# Patient Record
Sex: Female | Born: 1935
Health system: Southern US, Community
[De-identification: ages and names within clinical notes are randomized; demographics above are authoritative.]

## PROBLEM LIST (undated history)

## (undated) DIAGNOSIS — IMO0001 Reserved for inherently not codable concepts without codable children: Secondary | ICD-10-CM

## (undated) DIAGNOSIS — H269 Unspecified cataract: Secondary | ICD-10-CM

## (undated) DIAGNOSIS — E039 Hypothyroidism, unspecified: Secondary | ICD-10-CM

## (undated) DIAGNOSIS — C7A8 Other malignant neuroendocrine tumors: Secondary | ICD-10-CM

## (undated) DIAGNOSIS — R911 Solitary pulmonary nodule: Secondary | ICD-10-CM

## (undated) DIAGNOSIS — C4490 Unspecified malignant neoplasm of skin, unspecified: Secondary | ICD-10-CM

## (undated) DIAGNOSIS — C50919 Malignant neoplasm of unspecified site of unspecified female breast: Secondary | ICD-10-CM

## (undated) DIAGNOSIS — J189 Pneumonia, unspecified organism: Secondary | ICD-10-CM

## (undated) DIAGNOSIS — Z8489 Family history of other specified conditions: Secondary | ICD-10-CM

## (undated) DIAGNOSIS — N907 Vulvar cyst: Secondary | ICD-10-CM

## (undated) DIAGNOSIS — E041 Nontoxic single thyroid nodule: Secondary | ICD-10-CM

## (undated) HISTORY — DX: Nontoxic single thyroid nodule: E04.1

## (undated) HISTORY — PX: VARICOSE VEIN SURGERY: SHX832

## (undated) HISTORY — PX: EYE SURGERY: SHX253

## (undated) HISTORY — PX: CATARACT EXTRACTION W/ INTRAOCULAR LENS IMPLANT: SHX1309

---
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
## 2002-02-03 ENCOUNTER — Other Ambulatory Visit: Admission: RE | Admit: 2002-02-03 | Discharge: 2002-02-03 | Payer: Self-pay

## 2010-07-21 ENCOUNTER — Emergency Department (HOSPITAL_COMMUNITY): Admission: EM | Admit: 2010-07-21 | Discharge: 2010-07-21 | Payer: Self-pay | Admitting: Family Medicine

## 2014-08-23 ENCOUNTER — Other Ambulatory Visit: Payer: Self-pay | Admitting: Family Medicine

## 2014-08-23 DIAGNOSIS — E039 Hypothyroidism, unspecified: Secondary | ICD-10-CM

## 2014-08-28 ENCOUNTER — Other Ambulatory Visit: Payer: Self-pay

## 2016-05-09 ENCOUNTER — Other Ambulatory Visit: Payer: Self-pay | Admitting: Family Medicine

## 2016-05-09 DIAGNOSIS — N6320 Unspecified lump in the left breast, unspecified quadrant: Principal | ICD-10-CM

## 2016-05-09 DIAGNOSIS — N6325 Unspecified lump in the left breast, overlapping quadrants: Secondary | ICD-10-CM

## 2016-05-16 ENCOUNTER — Ambulatory Visit
Admission: RE | Admit: 2016-05-16 | Discharge: 2016-05-16 | Disposition: A | Discharge status: Home / Self Care | Payer: Medicare Other | Source: Ambulatory Visit | Attending: Family Medicine | Admitting: Family Medicine

## 2016-05-16 ENCOUNTER — Other Ambulatory Visit: Payer: Self-pay | Admitting: Family Medicine

## 2016-05-16 DIAGNOSIS — N6325 Unspecified lump in the left breast, overlapping quadrants: Secondary | ICD-10-CM

## 2016-05-16 DIAGNOSIS — N6320 Unspecified lump in the left breast, unspecified quadrant: Secondary | ICD-10-CM

## 2016-05-20 ENCOUNTER — Ambulatory Visit
Admission: RE | Admit: 2016-05-20 | Discharge: 2016-05-20 | Disposition: A | Discharge status: Home / Self Care | Payer: Medicare Other | Source: Ambulatory Visit | Attending: Family Medicine | Admitting: Family Medicine

## 2016-05-20 ENCOUNTER — Other Ambulatory Visit: Payer: Self-pay | Admitting: Family Medicine

## 2016-05-20 DIAGNOSIS — N6320 Unspecified lump in the left breast, unspecified quadrant: Secondary | ICD-10-CM

## 2016-05-22 ENCOUNTER — Telehealth: Payer: Self-pay | Admitting: *Deleted

## 2016-05-22 DIAGNOSIS — C50412 Malignant neoplasm of upper-outer quadrant of left female breast: Secondary | ICD-10-CM | POA: Insufficient documentation

## 2016-05-22 NOTE — Telephone Encounter (Signed)
Confirmed [facility] for 05/28/16 at 1215pm .  Instructions and contact information given.

## 2016-05-23 ENCOUNTER — Telehealth: Payer: Self-pay | Admitting: *Deleted

## 2016-05-23 NOTE — Telephone Encounter (Signed)
Mailed clinic packet to pt.

## 2016-05-28 ENCOUNTER — Other Ambulatory Visit: Payer: Self-pay | Admitting: General Surgery

## 2016-05-28 ENCOUNTER — Encounter: Payer: Self-pay | Admitting: Radiation Oncology

## 2016-05-28 ENCOUNTER — Ambulatory Visit (HOSPITAL_BASED_OUTPATIENT_CLINIC_OR_DEPARTMENT_OTHER): Payer: Medicare Other | Admitting: Hematology and Oncology

## 2016-05-28 ENCOUNTER — Ambulatory Visit
Admission: RE | Admit: 2016-05-28 | Discharge: 2016-05-28 | Disposition: A | Discharge status: Home / Self Care | Payer: Medicare Other | Source: Ambulatory Visit | Attending: Radiation Oncology | Admitting: Radiation Oncology

## 2016-05-28 ENCOUNTER — Ambulatory Visit: Payer: Medicare Other | Admitting: Physical Therapy

## 2016-05-28 ENCOUNTER — Other Ambulatory Visit (HOSPITAL_BASED_OUTPATIENT_CLINIC_OR_DEPARTMENT_OTHER): Payer: Medicare Other

## 2016-05-28 ENCOUNTER — Encounter: Payer: Self-pay | Admitting: Hematology and Oncology

## 2016-05-28 ENCOUNTER — Encounter: Payer: Self-pay | Admitting: General Practice

## 2016-05-28 VITALS — BP 137/66 | HR 80 | Temp 98.0°F | Resp 17 | Ht 60.0 in | Wt 111.0 lb

## 2016-05-28 DIAGNOSIS — C50412 Malignant neoplasm of upper-outer quadrant of left female breast: Secondary | ICD-10-CM

## 2016-05-28 DIAGNOSIS — Z8042 Family history of malignant neoplasm of prostate: Secondary | ICD-10-CM | POA: Diagnosis not present

## 2016-05-28 DIAGNOSIS — Z87891 Personal history of nicotine dependence: Secondary | ICD-10-CM

## 2016-05-28 DIAGNOSIS — Z801 Family history of malignant neoplasm of trachea, bronchus and lung: Secondary | ICD-10-CM

## 2016-05-28 HISTORY — DX: Unspecified cataract: H26.9

## 2016-05-28 HISTORY — DX: Unspecified malignant neoplasm of skin, unspecified: C44.90

## 2016-05-28 LAB — CBC WITH DIFFERENTIAL/PLATELET
BASO%: 0.6 % (ref 0.0–2.0)
BASOS ABS: 0 10*3/uL (ref 0.0–0.1)
EOS ABS: 0.1 10*3/uL (ref 0.0–0.5)
EOS%: 1.9 % (ref 0.0–7.0)
HCT: 45.8 % (ref 34.8–46.6)
HGB: 14.7 g/dL (ref 11.6–15.9)
LYMPH%: 24.2 % (ref 14.0–49.7)
MCH: 30.6 pg (ref 25.1–34.0)
MCHC: 32.1 g/dL (ref 31.5–36.0)
MCV: 95.2 fL (ref 79.5–101.0)
MONO#: 0.5 10*3/uL (ref 0.1–0.9)
MONO%: 7.2 % (ref 0.0–14.0)
NEUT#: 4.1 10*3/uL (ref 1.5–6.5)
NEUT%: 66.1 % (ref 38.4–76.8)
PLATELETS: 261 10*3/uL (ref 145–400)
RBC: 4.81 10*6/uL (ref 3.70–5.45)
RDW: 13 % (ref 11.2–14.5)
WBC: 6.2 10*3/uL (ref 3.9–10.3)
lymph#: 1.5 10*3/uL (ref 0.9–3.3)

## 2016-05-28 LAB — COMPREHENSIVE METABOLIC PANEL
ALT: 15 U/L (ref 0–55)
ANION GAP: 7 meq/L (ref 3–11)
AST: 20 U/L (ref 5–34)
Albumin: 3.9 g/dL (ref 3.5–5.0)
Alkaline Phosphatase: 61 U/L (ref 40–150)
BILIRUBIN TOTAL: 0.55 mg/dL (ref 0.20–1.20)
BUN: 11.3 mg/dL (ref 7.0–26.0)
CO2: 32 meq/L — AB (ref 22–29)
Calcium: 9.6 mg/dL (ref 8.4–10.4)
Chloride: 100 mEq/L (ref 98–109)
Creatinine: 1 mg/dL (ref 0.6–1.1)
EGFR: 53 mL/min/{1.73_m2} — AB (ref >90)
GLUCOSE: 135 mg/dL (ref 70–140)
POTASSIUM: 4.6 meq/L (ref 3.5–5.1)
SODIUM: 139 meq/L (ref 136–145)
Total Protein: 7.1 g/dL (ref 6.4–8.3)

## 2016-05-28 NOTE — Assessment & Plan Note (Signed)
05/20/2016: Left breast biopsy 2:30 position: IDC with DCIS, grade 1, ER 100%, PR 90%, Ki-67 5%, HER-2 negative ratio 1.33; is okay  1:30 position: 0.7 cm invasive lobular cancer, grade 1, ER/PR positive HER-2 negative Ki-67 10%, T1 N0 stage IA   Pathology and radiology counseling:Discussed with the patient, the details of pathology including the type of breast cancer,the clinical staging, the significance of ER, PR and HER-2/neu receptors and the implications for treatment. After reviewing the pathology in detail, we proceeded to discuss the different treatment options between surgery, radiation, and antiestrogen therapies.  Recommendations: Breast MRI will be done based upon the lobular histology 1. Breast conserving surgery followed by 2. Adjuvant radiation therapy (to be finalized based upon final pathology report) 3. Adjuvant antiestrogen therapy with anastrozole 5-10 years  Return to clinic after surgery to discuss final pathology report.

## 2016-05-28 NOTE — Progress Notes (Signed)
Met with patient and daughter at [facility] to introduce [place] team/resources, reviewing distress screen per protocol.  The patient scored a 2 on the Psychosocial Distress Thermometer which indicates mild distress.  Also assessed for distress and other psychosocial needs.  ONCBCN DISTRESS SCREENING 05/28/2016  Screening Type Initial Screening  Distress experienced in past week (1-10) 2  Emotional problem type Nervousness/Anxiety;Adjusting to illness  Referral to support programs Yes   Counselor used open question to assess how client is processing the new diagnosis. Client reported having more stress initially and anxiety around her diagnosis, but feeling more at ease after talking with multiple professionals. Counselor used reflection of feeling and normalization regarding client's stress, and explored coping mechanisms the client uses. Client reported family and her faith being the greatest support and coping mechanism. Counselor and client discussed other support networks she has through church, high school friends, and neighbors around her.   Follow up needed:  [N]  Pt is aware of ongoing Support Team availability and contact information. Please also page as needs arise/circumstances change.  Thank you.  Wendall Papa, MS, [place], [facility] Counseling Intern-Department for Spiritual Care and [place], [place], [place]

## 2016-05-28 NOTE — Progress Notes (Signed)
Radiation Oncology         [phone] ________________________________  Initial Outpatient Consultation  Name: Victoria Rice MRN: [id number]  Date: 05/28/2016  DOB: [birth year]  TD:VVOHYW,VPXTGGYIR STEWART, MD  Leighton Ruff, MD   REFERRING PHYSICIAN: Leighton Ruff, MD  DIAGNOSIS: The encounter diagnosis was Breast cancer of upper-outer quadrant of left female breast ([place]).  Multifocal clinical stage I left breast cancer  HISTORY OF PRESENT ILLNESS::Victoria Rice is a 82 y.o. female who presented to her PCP, Dr. Leighton Ruff, for a palpable abnormality in the left breast. Mammogram and ultrasound on 05/16/16 noted two masses in the upper-outer left breast, measuring 0.7 x 0.3 x 0.4 cm and 0.4 x 0.4 x 0.3 cm. They are approximately 2.8 cm from each other and no lymphadenopathy was seen in the left axilla.  Biopsies were conducted on 05/20/16. Biopsy of the left breast in the 1 o'clock position was positive for grade 1 invasive and in situ mammary carcinoma (lobular phenotype) (ER 95% positive, PR 70% positive, HER2 negative, Ki67 10%). Biopsy of the left breast at the 2:30 o'clock position revealed grade 1 invasive and in situ mammary carcinoma (ER 100% positive, PR 90% positive, Ki67 5%).  The patient presents today in breast multidisciplinary clinic to discuss the role of radiation in the management of her disease.  PREVIOUS RADIATION THERAPY: No  PAST MEDICAL HISTORY:  has a past medical history of Skin cancer; Cataract; and Thyroid nodule.    PAST SURGICAL HISTORY: Past Surgical History  Procedure Laterality Date  . Breast lumpectomy      FAMILY HISTORY: family history includes Lung cancer in her brother; Prostate cancer in her brother.  SOCIAL HISTORY:  reports that she quit smoking about 42 years ago. Her smoking use included Cigarettes. She smoked 0.75 packs per day. She does not have any smokeless tobacco history on file. She reports that she drinks about 3.6  oz of alcohol per week.  ALLERGIES: Review of patient's allergies indicates no known allergies.  MEDICATIONS:  Current Outpatient Prescriptions  Medication Sig Dispense Refill  . cholecalciferol (VITAMIN D) 1000 units tablet Take 1,000 Units by mouth daily.    [name] SYNTHROID 75 MCG tablet      No current facility-administered medications for this encounter.    REVIEW OF SYSTEMS:  A 15 point review of systems is documented in the electronic medical record. This was obtained by the nursing staff. However, I reviewed this with the patient to discuss relevant findings and make appropriate changes.  Pertinent items noted in HPI and remainder of comprehensive ROS otherwise negative.  No pain within the breast nipple discharge or bleeding prior to diagnosis  The patient reports sinus problems, shortness of breath, and thyroid problems.  Gynecologic History  Age at first menstrual period? 15  Are you still having periods? No Approximate date of last period? 1985  If you no longer have periods: Have you used hormone replacement? No Obstetric History:  How many children have you carried to term? 2 Your age at first live birth? 31  Pregnant now or trying to get pregnant? No  Have you used birth control pills or hormone shots for contraception? No Health Maintenance:  Have you ever had a colonoscopy? No  Have you ever had a bone density? Yes If yes, date? ~1999  Date of your last PAP smear? ? Date of your FIRST mammogram? ?   PHYSICAL EXAM:  vitals were not taken for this visit.  Vitals with BMI 05/28/2016  Height  _0   Weight 111 lbs  BMI 67.1  Systolic 245  Diastolic 66  Pulse 80  Respirations 17  Lungs are clear to auscultation bilaterally. Heart has regular rate and rhythm. No palpable cervical, supraclavicular, or axillary adenopathy. Right lumpectomy scar from a prior benign surgery. Left breast shows extensive bruising in the upper outer quadrant. No dominant mass within the breast  nipple discharge or bleeding  ECOG = 1   LABORATORY DATA:  Lab Results  Component Value Date   WBC 6.2 05/28/2016   HGB 14.7 05/28/2016   HCT 45.8 05/28/2016   MCV 95.2 05/28/2016   PLT 261 05/28/2016   NEUTROABS 4.1 05/28/2016   Lab Results  Component Value Date   NA 139 05/28/2016   K 4.6 05/28/2016   CO2 32* 05/28/2016   GLUCOSE 135 05/28/2016   CREATININE 1.0 05/28/2016   CALCIUM 9.6 05/28/2016      RADIOGRAPHY: Mm Digital Diagnostic Unilat L  05/20/2016  CLINICAL DATA:  Evaluate marker placement after biopsy EXAM: DIAGNOSTIC LEFT MAMMOGRAM POST ULTRASOUND BIOPSY COMPARISON:  Previous exam(s). FINDINGS: Mammographic images were obtained following ultrasound guided biopsy of 2 left breast masses. A coil shaped clip is located within the small spiculated mass in the upper outer breast, correlating with the 1 o'clock mass seen sonographically. Unfortunately, the ribbon shaped clip did not deploy within the 2:30 mass. IMPRESSION: The coil shaped clip is in good position. The ribbon shaped clip did not deploy within the mass at 2:30, 4 cm from the nipple after biopsy. If clip placement is necessary, it can be reattempted within the 2:30, 4 cm from the nipple mass after post biopsy changes resolve. Final Assessment: Post Procedure Mammograms for Marker Placement Electronically Signed   By: Dorise Bullion III M.D   On: 05/20/2016 17:16   US Breast Ltd Uni Left Inc Axilla  05/16/2016  CLINICAL DATA:  82 year old female with a palpable abnormality in the left breast. EXAM: 2D DIGITAL DIAGNOSTIC BILATERAL MAMMOGRAM WITH CAD AND ADJUNCT TOMO LEFT BREAST ULTRASOUND COMPARISON:  None. ACR Breast Density Category b: There are scattered areas of fibroglandular density. FINDINGS: No suspicious masses are palpable abnormalities seen in the right breast. A spot compression tangential view was performed over the palpable area of concern in the medial left breast with no mammographic abnormalities seen  in this location. There are 2 masses seen in the upper outer left breast, with the more anterior and somewhat spiculated appearing mass measuring 5 mm and the more posterior mass measuring approximately 7 mm. Mammographic images were processed with CAD. Physical examination of the upper-outer left breast reveals a palpable abnormality at the approximate 2:30 position. Targeted ultrasound of the left breast was performed demonstrating an irregular hypoechoic mass at the 2:30 position 4 cm from the nipple measuring 0.7 x 0.3 x 0.4 cm. This is felt to correspond with the more posterior located mass seen on mammography. There is an irregular mass in the left breast at 1 o'clock 4 cm from nipple measuring approximately 0.4 x 0.4 x 0.3 cm. This is felt to correspond with the more anterior located spiculated mass seen on mammography. These masses are located approximately 2.8 cm apart from each other. No lymphadenopathy seen in the left axilla. IMPRESSION: 1.  Two suspicious masses in the upper-outer left breast. 2. No mammographic or sonographic abnormality seen in the area of palpable concern in the inner left breast. 3.  No mammographic evidence of malignancy in the right breast. RECOMMENDATION: Ultrasound-guided biopsy  of the 2 masses in the upper-outer left breast is recommended. This is scheduled for Tuesday 05/20/2016 at 3:30 p.m. I have discussed the findings and recommendations with the patient. Results were also provided in writing at the conclusion of the visit. If applicable, a reminder letter will be sent to the patient regarding the next appointment. BI-RADS CATEGORY  5: Highly suggestive of malignancy. Electronically Signed   By: Everlean Alstrom M.D.   On: 05/16/2016 12:32   Mm Diag Breast Tomo Bilateral  05/16/2016  CLINICAL DATA:  82 year old female with a palpable abnormality in the left breast. EXAM: 2D DIGITAL DIAGNOSTIC BILATERAL MAMMOGRAM WITH CAD AND ADJUNCT TOMO LEFT BREAST ULTRASOUND COMPARISON:   None. ACR Breast Density Category b: There are scattered areas of fibroglandular density. FINDINGS: No suspicious masses are palpable abnormalities seen in the right breast. A spot compression tangential view was performed over the palpable area of concern in the medial left breast with no mammographic abnormalities seen in this location. There are 2 masses seen in the upper outer left breast, with the more anterior and somewhat spiculated appearing mass measuring 5 mm and the more posterior mass measuring approximately 7 mm. Mammographic images were processed with CAD. Physical examination of the upper-outer left breast reveals a palpable abnormality at the approximate 2:30 position. Targeted ultrasound of the left breast was performed demonstrating an irregular hypoechoic mass at the 2:30 position 4 cm from the nipple measuring 0.7 x 0.3 x 0.4 cm. This is felt to correspond with the more posterior located mass seen on mammography. There is an irregular mass in the left breast at 1 o'clock 4 cm from nipple measuring approximately 0.4 x 0.4 x 0.3 cm. This is felt to correspond with the more anterior located spiculated mass seen on mammography. These masses are located approximately 2.8 cm apart from each other. No lymphadenopathy seen in the left axilla. IMPRESSION: 1.  Two suspicious masses in the upper-outer left breast. 2. No mammographic or sonographic abnormality seen in the area of palpable concern in the inner left breast. 3.  No mammographic evidence of malignancy in the right breast. RECOMMENDATION: Ultrasound-guided biopsy of the 2 masses in the upper-outer left breast is recommended. This is scheduled for Tuesday 05/20/2016 at 3:30 p.m. I have discussed the findings and recommendations with the patient. Results were also provided in writing at the conclusion of the visit. If applicable, a reminder letter will be sent to the patient regarding the next appointment. BI-RADS CATEGORY  5: Highly suggestive of  malignancy. Electronically Signed   By: Everlean Alstrom M.D.   On: 05/16/2016 12:32   [place] Lt Breast Bx W Loc Dev 1st Lesion Img Bx Spec US Guide  05/22/2016  ADDENDUM REPORT: 05/21/2016 14:04 ADDENDUM: Pathology revealed GRADE I INVASIVE AND IN SITU MAMMARY CARCINOMA of the Left breast at both locations, 2:30 o'clock and 1:00 o'clock. This was found to be concordant by Dr. Dorise Bullion. Pathology results were discussed with the patient by telephone. The patient reported doing well after the biopsies with tenderness and minimal oozing at the sites. Post biopsy instructions and care were reviewed and questions were answered. The patient was encouraged to call [facility] for any additional concerns. The patient was referred to [facility] on May 28, 2016. Pathology results reported by Terie Purser, RN on 05/21/2016. Electronically Signed   By: Dorise Bullion III M.D   On: 05/21/2016 14:04  05/22/2016  CLINICAL DATA:  Left breast mass at 2:30, 4 cm from the nipple EXAM: ULTRASOUND GUIDED LEFT BREAST CORE NEEDLE BIOPSY COMPARISON:  Previous exam(s). FINDINGS: I met with the patient and we discussed the procedure of ultrasound-guided biopsy, including benefits and alternatives. We discussed the high likelihood of a successful procedure. We discussed the risks of the procedure, including infection, bleeding, tissue injury, clip migration, and inadequate sampling. Informed written consent was given. The usual time-out protocol was performed immediately prior to the procedure. Using sterile technique and 1% Lidocaine as local anesthetic, under direct ultrasound visualization, a 12 gauge spring-loaded device was used to perform biopsy of a left breast mass at 2:30 using a lateral approach. At the conclusion of the procedure a ribbon shaped tissue marker clip was deployed into the biopsy cavity. Follow up 2 view  mammogram was performed and dictated separately. IMPRESSION: Ultrasound guided biopsy of a left breast mass at 2:30. No apparent complications. Electronically Signed: By: Dorise Bullion III M.D On: 05/20/2016 16:56   [place] Lt Breast Bx W Loc Dev Ea Add Lesion Img Bx Spec US Guide  05/22/2016  ADDENDUM REPORT: 05/21/2016 14:04 ADDENDUM: Pathology revealed GRADE I INVASIVE AND IN SITU MAMMARY CARCINOMA of the Left breast at both locations, 2:30 o'clock and 1:00 o'clock. This was found to be concordant by Dr. Dorise Bullion. Pathology results were discussed with the patient by telephone. The patient reported doing well after the biopsies with tenderness and minimal oozing at the sites. Post biopsy instructions and care were reviewed and questions were answered. The patient was encouraged to call [facility] for any additional concerns. The patient was referred to [facility] on May 28, 2016. Pathology results reported by Terie Purser, RN on 05/21/2016. Electronically Signed   By: Dorise Bullion III M.D   On: 05/21/2016 14:04  05/22/2016  CLINICAL DATA:  Left breast mass at 1 o'clock EXAM: ULTRASOUND GUIDED LEFT BREAST CORE NEEDLE BIOPSY COMPARISON:  Previous exam(s). FINDINGS: I met with the patient and we discussed the procedure of ultrasound-guided biopsy, including benefits and alternatives. We discussed the high likelihood of a successful procedure. We discussed the risks of the procedure, including infection, bleeding, tissue injury, clip migration, and inadequate sampling. Informed written consent was given. The usual time-out protocol was performed immediately prior to the procedure. Using sterile technique and 1% Lidocaine as local anesthetic, under direct ultrasound visualization, a 12 gauge spring-loaded device was used to perform biopsy of a left breast mass at 1 o'clock using a lateral approach. At the  conclusion of the procedure a coil shaped tissue marker clip was deployed into the biopsy cavity. Follow up 2 view mammogram was performed and dictated separately. IMPRESSION: Ultrasound guided biopsy of a left breast mass. No apparent complications. Electronically Signed: By: Dorise Bullion III M.D On: 05/20/2016 16:57      IMPRESSION: Multifocal clinical stage I left breast cancer I spoke to the patient today regarding her diagnosis and options for treatment. We discussed the equivalence in terms of survival and local failure between mastectomy and breast conservation. We discussed the role of radiation in decreasing local failures in patients who undergo lumpectomy. We discussed the process of simulation and the placement tattoos. We discussed 4-6 weeks of treatment as an outpatient. We discussed the possibility of asymptomatic lung damage. We discussed the low likelihood of secondary malignancies. We discussed the possible side effects including but  not limited to skin redness, fatigue, permanent skin darkening, and breast swelling.  We discussed the use of cardiac sparing with deep inspiration breath hold if needed.  PLAN: The patient is scheduled for a bilateral breast MRI on 05/30/16 given the diagnosis of invasive lobular on one of her biopsy specimens.  The decision on surgery will be determined after the MRI results. The patient will follow up with me after surgery to discuss radiation treatment options.     ------------------------------------------------  Blair Promise, PhD, MD  This document serves as a record of services personally performed by Gery Pray, MD. It was created on his behalf by Darcus Austin, a trained medical scribe. The creation of this record is based on the scribe's personal observations and the provider's statements to them. This document has been checked and approved by the attending provider.

## 2016-05-28 NOTE — Progress Notes (Signed)
[place] CONSULT NOTE  Patient Care Team: Leighton Ruff, MD as PCP - General (Family Medicine) Nicholas Lose, MD as Consulting Physician (Hematology and Oncology) Fanny Skates, MD as Consulting Physician (General Surgery) Gery Pray, MD as Consulting Physician (Radiation Oncology)  CHIEF COMPLAINTS/PURPOSE OF CONSULTATION:  Newly diagnosed breast cancer  HISTORY OF PRESENTING ILLNESS:  Victoria Rice 82 y.o. female is here because of recent diagnosis of left breast cancer. Initially her primary care physician felt a lump in the left breast which was evaluated by mammogram and ultrasound. The area of the palpable lump was clear but 2 new areas of abnormalities were detected on the mammogram of upper-outer quadrant. There was a 0.4 cm mass at 2:30 position which on biopsy came back as grade 1 invasive ductal carcinoma that was ER/PR positive HER-2 negative with a Ki-67 of 5%. A second mass was biopsied at 1:30 position measuring 0.7 cm which came back as invasive lobular cancer grade 1 that was ER/PR positive HER-2 negative with a Ki-67 of 10%. She was presented this morning to the multidisciplinary tumor board and she is here today to discuss the treatment plan. Apart from extensive bruising related to the biopsy she is otherwise feeling well.   I reviewed her records extensively and collaborated the history with the patient.  SUMMARY OF ONCOLOGIC HISTORY:   Breast cancer of upper-outer quadrant of left female breast ([place])   05/20/2016 Initial Diagnosis Left breast biopsy 2:30 position: IDC with DCIS, grade 1, ER 100%, PR 90%, Ki-67 5%, HER-2 negative ratio 1.33; is okay  1:30 position: 0.7 cm invasive lobular cancer, grade 1, ER/PR positive HER-2 negative Ki-67 10%, T1 N0 stage IA     MEDICAL HISTORY:  Past Medical History  Diagnosis Date  . Skin cancer   . Cataract   . Thyroid nodule     SURGICAL HISTORY: Past Surgical History  Procedure Laterality Date  .  Breast lumpectomy      SOCIAL HISTORY: Social History   Social History  . Marital Status: Single    Spouse Name: N/A  . Number of Children: N/A  . Years of Education: N/A   Occupational History  . Not on file.   Social History Main Topics  . Smoking status: Former Smoker -- 0.75 packs/day    Types: Cigarettes    Quit date: 05/08/1974  . Smokeless tobacco: Not on file  . Alcohol Use: 3.6 oz/week    6 Glasses of wine per week  . Drug Use: Not on file  . Sexual Activity: Not on file   Other Topics Concern  . Not on file   Social History Narrative    FAMILY HISTORY: Family History  Problem Relation Age of Onset  . Prostate cancer Brother   . Lung cancer Brother     ALLERGIES:  has No Known Allergies.  MEDICATIONS:  Current Outpatient Prescriptions  Medication Sig Dispense Refill  . cholecalciferol (VITAMIN D) 1000 units tablet Take 1,000 Units by mouth daily.    [name] SYNTHROID 75 MCG tablet      No current facility-administered medications for this visit.    REVIEW OF SYSTEMS:   Constitutional: Denies fevers, chills or abnormal night sweats Eyes: Denies blurriness of vision, double vision or watery eyes Ears, nose, mouth, throat, and face: Denies mucositis or sore throat Respiratory: Denies cough, dyspnea or wheezes Cardiovascular: Denies palpitation, chest discomfort or lower extremity swelling Gastrointestinal:  Denies nausea, heartburn or change in bowel habits Skin: Denies abnormal skin  rashes Lymphatics: Denies new lymphadenopathy or easy bruising Neurological:Denies numbness, tingling or new weaknesses Behavioral/Psych: Mood is stable, no new changes  Breast: Bruising from recent biopsies All other systems were reviewed with the patient and are negative.  PHYSICAL EXAMINATION: ECOG PERFORMANCE STATUS: 1 - Symptomatic but completely ambulatory  Filed Vitals:   05/28/16 1249  BP: 137/66  Pulse: 80  Temp: 98 F (36.7 C)  Resp: 17   Filed Weights    05/28/16 1249  Weight: 111 lb (50.349 kg)    GENERAL:alert, no distress and comfortable SKIN: skin color, texture, turgor are normal, no rashes or significant lesions EYES: normal, conjunctiva are pink and non-injected, sclera clear OROPHARYNX:no exudate, no erythema and lips, buccal mucosa, and tongue normal  NECK: supple, thyroid normal size, non-tender, without nodularity LYMPH:  no palpable lymphadenopathy in the cervical, axillary or inguinal LUNGS: clear to auscultation and percussion with normal breathing effort HEART: regular rate & rhythm and no murmurs and no lower extremity edema ABDOMEN:abdomen soft, non-tender and normal bowel sounds Musculoskeletal:no cyanosis of digits and no clubbing  PSYCH: alert & oriented x 3 with fluent speech NEURO: no focal motor/sensory deficits BREAST: Bruising from recent biopsies and a small hematoma. No palpable axillary or supraclavicular lymphadenopathy (exam performed in the presence of a chaperone)   LABORATORY DATA:  I have reviewed the data as listed Lab Results  Component Value Date   WBC 6.2 05/28/2016   HGB 14.7 05/28/2016   HCT 45.8 05/28/2016   MCV 95.2 05/28/2016   PLT 261 05/28/2016   Lab Results  Component Value Date   NA 139 05/28/2016   K 4.6 05/28/2016   CO2 32* 05/28/2016    RADIOGRAPHIC STUDIES: I have personally reviewed the radiological reports and agreed with the findings in the report.  ASSESSMENT AND PLAN:  Breast cancer of upper-outer quadrant of left female breast ([place]) 05/20/2016: Left breast biopsy 2:30 position: IDC with DCIS, grade 1, ER 100%, PR 90%, Ki-67 5%, HER-2 negative ratio 1.33; is okay  1:30 position: 0.7 cm invasive lobular cancer, grade 1, ER/PR positive HER-2 negative Ki-67 10%, T1 N0 stage IA   Pathology and radiology counseling:Discussed with the patient, the details of pathology including the type of breast cancer,the clinical staging, the significance of ER, PR and HER-2/neu  receptors and the implications for treatment. After reviewing the pathology in detail, we proceeded to discuss the different treatment options between surgery, radiation, and antiestrogen therapies.  Recommendations: Breast MRI will be done based upon the lobular histology 1. Breast conserving surgery followed by 2. Adjuvant radiation therapy (to be finalized based upon final pathology report) 3. Adjuvant antiestrogen therapy with anastrozole 5-10 years  Return to clinic after surgery to discuss final pathology report.   All questions were answered. The patient knows to call the clinic with any problems, questions or concerns.    Rulon Eisenmenger, MD 05/28/2016

## 2016-05-30 ENCOUNTER — Other Ambulatory Visit: Payer: Medicare Other

## 2016-06-02 ENCOUNTER — Telehealth: Payer: Self-pay | Admitting: *Deleted

## 2016-06-02 NOTE — Telephone Encounter (Signed)
Left message to follow up from [facility] 05/28/16.

## 2016-06-09 ENCOUNTER — Ambulatory Visit
Admission: RE | Admit: 2016-06-09 | Discharge: 2016-06-09 | Disposition: A | Discharge status: Home / Self Care | Payer: Medicare Other | Source: Ambulatory Visit | Attending: General Surgery | Admitting: General Surgery

## 2016-06-09 DIAGNOSIS — C50412 Malignant neoplasm of upper-outer quadrant of left female breast: Secondary | ICD-10-CM

## 2016-06-09 MED ORDER — GADOBENATE DIMEGLUMINE 529 MG/ML IV SOLN
10.0000 mL | Freq: Once | INTRAVENOUS | Status: AC | PRN
Start: 2016-06-09 — End: 2016-06-09
  Administered 2016-06-09: 9 mL via INTRAVENOUS

## 2016-06-12 ENCOUNTER — Other Ambulatory Visit: Payer: Self-pay | Admitting: General Surgery

## 2016-06-13 ENCOUNTER — Other Ambulatory Visit: Payer: Self-pay

## 2016-06-13 ENCOUNTER — Other Ambulatory Visit: Payer: Self-pay | Admitting: *Deleted

## 2016-06-13 DIAGNOSIS — C50412 Malignant neoplasm of upper-outer quadrant of left female breast: Secondary | ICD-10-CM

## 2016-06-16 ENCOUNTER — Telehealth: Payer: Self-pay | Admitting: *Deleted

## 2016-06-16 NOTE — Telephone Encounter (Signed)
  Oncology Nurse Navigator Documentation    Navigator Encounter Type: Telephone (06/16/16 0800) Telephone: Incoming Call;Appt Confirmation/Clarification (CT chest and MRI bx) (06/16/16 0800)  Discussed MRI findings and need for CT and MRI bx. Received verbal understanding Denies further needs at this time.                                      Time Spent with Patient: 15 (06/16/16 0800)

## 2016-06-17 ENCOUNTER — Other Ambulatory Visit: Payer: Self-pay | Admitting: General Surgery

## 2016-06-18 ENCOUNTER — Other Ambulatory Visit: Payer: Self-pay | Admitting: Hematology and Oncology

## 2016-06-18 DIAGNOSIS — N6310 Unspecified lump in the right breast, unspecified quadrant: Secondary | ICD-10-CM

## 2016-06-19 ENCOUNTER — Ambulatory Visit
Admission: RE | Admit: 2016-06-19 | Discharge: 2016-06-19 | Disposition: A | Discharge status: Home / Self Care | Payer: Medicare Other | Source: Ambulatory Visit | Attending: Hematology and Oncology | Admitting: Hematology and Oncology

## 2016-06-19 DIAGNOSIS — N6310 Unspecified lump in the right breast, unspecified quadrant: Secondary | ICD-10-CM

## 2016-06-19 DIAGNOSIS — C50412 Malignant neoplasm of upper-outer quadrant of left female breast: Secondary | ICD-10-CM

## 2016-06-19 MED ORDER — GADOBENATE DIMEGLUMINE 529 MG/ML IV SOLN
9.0000 mL | Freq: Once | INTRAVENOUS | Status: AC | PRN
  Administered 2016-06-19: 9 mL via INTRAVENOUS

## 2016-06-20 ENCOUNTER — Other Ambulatory Visit: Payer: Medicare Other

## 2016-06-23 ENCOUNTER — Ambulatory Visit
Admission: RE | Admit: 2016-06-23 | Discharge: 2016-06-23 | Disposition: A | Discharge status: Home / Self Care | Payer: Medicare Other | Source: Ambulatory Visit | Attending: Hematology and Oncology | Admitting: Hematology and Oncology

## 2016-06-23 ENCOUNTER — Telehealth: Payer: Self-pay | Admitting: Hematology and Oncology

## 2016-06-23 ENCOUNTER — Other Ambulatory Visit: Payer: Self-pay | Admitting: Hematology and Oncology

## 2016-06-23 ENCOUNTER — Other Ambulatory Visit: Payer: Medicare Other

## 2016-06-23 DIAGNOSIS — C50412 Malignant neoplasm of upper-outer quadrant of left female breast: Secondary | ICD-10-CM

## 2016-06-23 MED ORDER — IOPAMIDOL (ISOVUE-300) INJECTION 61%
75.0000 mL | Freq: Once | INTRAVENOUS | Status: AC | PRN
  Administered 2016-06-23: 75 mL via INTRAVENOUS

## 2016-06-23 NOTE — Telephone Encounter (Signed)
I called the patient regarding the results of the CT of the chest. There was a 1.37 m lung nodule in the left lower lobe. Based upon the radiologist recommendation, I would like to order a PET CT scan. I called and informed the patient about what a PET CT scan is. I placed orders and requested the scheduler to make this appointment after prior authorization is done.

## 2016-06-24 ENCOUNTER — Other Ambulatory Visit: Payer: Self-pay | Admitting: General Surgery

## 2016-06-25 ENCOUNTER — Encounter: Payer: Self-pay | Admitting: *Deleted

## 2016-06-27 ENCOUNTER — Telehealth: Payer: Self-pay | Admitting: *Deleted

## 2016-06-27 NOTE — Telephone Encounter (Signed)
Spoke with patient to schedule appointment with Dr. Lindi Adie for 7/31 at 1045am to discuss scan results.

## 2016-07-02 ENCOUNTER — Other Ambulatory Visit: Payer: Self-pay | Admitting: General Surgery

## 2016-07-02 DIAGNOSIS — C50912 Malignant neoplasm of unspecified site of left female breast: Principal | ICD-10-CM

## 2016-07-02 DIAGNOSIS — C50911 Malignant neoplasm of unspecified site of right female breast: Secondary | ICD-10-CM

## 2016-07-04 ENCOUNTER — Ambulatory Visit (HOSPITAL_COMMUNITY)
Admission: RE | Admit: 2016-07-04 | Discharge: 2016-07-04 | Disposition: A | Discharge status: Home / Self Care | Payer: Medicare Other | Source: Ambulatory Visit | Attending: Hematology and Oncology | Admitting: Hematology and Oncology

## 2016-07-04 DIAGNOSIS — R911 Solitary pulmonary nodule: Secondary | ICD-10-CM | POA: Diagnosis not present

## 2016-07-04 DIAGNOSIS — I2510 Atherosclerotic heart disease of native coronary artery without angina pectoris: Secondary | ICD-10-CM | POA: Diagnosis not present

## 2016-07-04 DIAGNOSIS — C50412 Malignant neoplasm of upper-outer quadrant of left female breast: Secondary | ICD-10-CM | POA: Diagnosis not present

## 2016-07-04 DIAGNOSIS — K8020 Calculus of gallbladder without cholecystitis without obstruction: Secondary | ICD-10-CM | POA: Diagnosis not present

## 2016-07-04 DIAGNOSIS — J439 Emphysema, unspecified: Secondary | ICD-10-CM | POA: Diagnosis not present

## 2016-07-04 DIAGNOSIS — K5730 Diverticulosis of large intestine without perforation or abscess without bleeding: Secondary | ICD-10-CM | POA: Diagnosis not present

## 2016-07-04 DIAGNOSIS — I700 Atherosclerosis of aorta: Secondary | ICD-10-CM | POA: Diagnosis not present

## 2016-07-04 DIAGNOSIS — D259 Leiomyoma of uterus, unspecified: Secondary | ICD-10-CM | POA: Insufficient documentation

## 2016-07-04 LAB — GLUCOSE, CAPILLARY: Glucose-Capillary: 94 mg/dL (ref 65–99)

## 2016-07-04 MED ORDER — FLUDEOXYGLUCOSE F - 18 (FDG) INJECTION
5.4800 | Freq: Once | INTRAVENOUS | Status: AC | PRN
  Administered 2016-07-04: 5.48 via INTRAVENOUS

## 2016-07-07 ENCOUNTER — Telehealth: Payer: Self-pay | Admitting: Hematology and Oncology

## 2016-07-07 ENCOUNTER — Encounter: Payer: Self-pay | Admitting: Hematology and Oncology

## 2016-07-07 ENCOUNTER — Ambulatory Visit (HOSPITAL_BASED_OUTPATIENT_CLINIC_OR_DEPARTMENT_OTHER): Payer: Medicare Other | Admitting: Hematology and Oncology

## 2016-07-07 VITALS — BP 161/79 | HR 75 | Temp 97.9°F | Resp 18 | Wt 109.3 lb

## 2016-07-07 DIAGNOSIS — R911 Solitary pulmonary nodule: Secondary | ICD-10-CM

## 2016-07-07 DIAGNOSIS — Z87891 Personal history of nicotine dependence: Secondary | ICD-10-CM | POA: Diagnosis not present

## 2016-07-07 DIAGNOSIS — C50412 Malignant neoplasm of upper-outer quadrant of left female breast: Secondary | ICD-10-CM

## 2016-07-07 NOTE — Telephone Encounter (Signed)
appt made and avs to be printed

## 2016-07-07 NOTE — Assessment & Plan Note (Signed)
05/20/2016: Left breast biopsy 2:30 position: IDC with DCIS, grade 1, ER 100%, PR 90%, Ki-67 5%, HER-2 negative ratio 1.33; is okay  1:30 position: 0.7 cm invasive lobular cancer, grade 1, ER/PR positive HER-2 negative Ki-67 10%, T1 N0 stage IA   Right breast: 1.3 x 0.5 x 0.8 cm linearly oriented mass extends posteriorly to the level of pectoralis muscle, no lymph nodes, 1.8 cm left lower lobe lung nodule  PET CT scan: 07/04/2016: 1.7 cm hypermetabolic nodule medial left lower lobe suspicious for lung cancer. Moderate emphysema, tiny subcentimeter pulmonary nodules too small to characterize.  Lung nodule: Suspicious for lung cancer. I will refer the patient to Dr. Roxan Hockey to evaluate biopsy versus excision. We reviewed the PET CT scan on it provided her a copy of this report. I discussed with her that the lung nodule has nothing to do with the DCIS diagnosis.  Return to clinic after surgery.

## 2016-07-07 NOTE — Progress Notes (Signed)
Patient Care Team: Leighton Ruff, MD as PCP - General (Family Medicine) Nicholas Lose, MD as Consulting Physician (Hematology and Oncology) Fanny Skates, MD as Consulting Physician (General Surgery) Gery Pray, MD as Consulting Physician (Radiation Oncology)  DIAGNOSIS: Breast cancer of upper-outer quadrant of left female breast [facility])   Staging form: Breast, AJCC 7th Edition   - Clinical stage from 05/28/2016: Stage IA (T1b, N0, M0) - Unsigned  SUMMARY OF ONCOLOGIC HISTORY:   Breast cancer of upper-outer quadrant of left female breast ([place])   05/20/2016 Initial Diagnosis    Left breast biopsy 2:30 position: IDC with DCIS, grade 1, ER 100%, PR 90%, Ki-67 5%, HER-2 negative ratio 1.33; is okay  1:30 position: 0.7 cm invasive lobular cancer, grade 1, ER/PR positive HER-2 negative Ki-67 10%, T1 N0 stage IA      06/09/2016 Breast MRI    Right breast: 1.3 x 0.5 x 0.8 cm linearly oriented mass extends posteriorly to the level of pectoralis muscle, no lymph nodes, 1.8 cm left lower lobe lung nodule     07/04/2016 PET scan    1.7 cm hypermetabolic nodule medial left lower lobe suspicious for lung cancer. Moderate emphysema, tiny subcentimeter pulmonary nodules too small to characterize      CHIEF COMPLIANT: Follow-up to discuss a PET/CT scan  INTERVAL HISTORY: Victoria Rice is a 82 year old lady with above-mentioned history of left breast DCIS who is scheduled to undergo lumpectomy on 07/23/2016. She had an incidentally found lung nodule on the breast MRI. This was evaluated by CT chest and then a PET/CT scan was also done. The lung nodule appears to be hypermetabolic and is concerning for a primary lung cancer. Patient was a previous smoker quit smoking 40 years ago. She denies any cough expectoration fevers chills or shortness of breath.  REVIEW OF SYSTEMS:   Constitutional: Denies fevers, chills or abnormal weight loss Eyes: Denies blurriness of vision Ears, nose, mouth, throat, and  face: Denies mucositis or sore throat Respiratory: Denies cough, dyspnea or wheezes Cardiovascular: Denies palpitation, chest discomfort Gastrointestinal:  Denies nausea, heartburn or change in bowel habits Skin: Denies abnormal skin rashes Lymphatics: Denies new lymphadenopathy or easy bruising Neurological:Denies numbness, tingling or new weaknesses Behavioral/Psych: Mood is stable, no new changes  Extremities: No lower extremity edema  All other systems were reviewed with the patient and are negative.  I have reviewed the past medical history, past surgical history, social history and family history with the patient and they are unchanged from previous note.  ALLERGIES:  has No Known Allergies.  MEDICATIONS:  Current Outpatient Prescriptions  Medication Sig Dispense Refill  . cholecalciferol (VITAMIN D) 1000 units tablet Take 1,000 Units by mouth daily.    [name] SYNTHROID 75 MCG tablet      No current facility-administered medications for this visit.     PHYSICAL EXAMINATION: ECOG PERFORMANCE STATUS: 1 - Symptomatic but completely ambulatory  Vitals:   07/07/16 1041  BP: (!) 161/79  Pulse: 75  Resp: 18  Temp: 97.9 F (36.6 C)   Filed Weights   07/07/16 1041  Weight: 109 lb 4.8 oz (49.6 kg)    GENERAL:alert, no distress and comfortable SKIN: skin color, texture, turgor are normal, no rashes or significant lesions EYES: normal, Conjunctiva are pink and non-injected, sclera clear OROPHARYNX:no exudate, no erythema and lips, buccal mucosa, and tongue normal  NECK: supple, thyroid normal size, non-tender, without nodularity LYMPH:  no palpable lymphadenopathy in the cervical, axillary or inguinal LUNGS: clear to auscultation and  percussion with normal breathing effort HEART: regular rate & rhythm and no murmurs and no lower extremity edema ABDOMEN:abdomen soft, non-tender and normal bowel sounds MUSCULOSKELETAL:no cyanosis of digits and no clubbing  NEURO: alert &  oriented x 3 with fluent speech, no focal motor/sensory deficits EXTREMITIES: No lower extremity edema   LABORATORY DATA:  I have reviewed the data as listed   Chemistry      Component Value Date/Time   NA 139 05/28/2016 1232   K 4.6 05/28/2016 1232   CO2 32 (H) 05/28/2016 1232   BUN 11.3 05/28/2016 1232   CREATININE 1.0 05/28/2016 1232      Component Value Date/Time   CALCIUM 9.6 05/28/2016 1232   ALKPHOS 61 05/28/2016 1232   AST 20 05/28/2016 1232   ALT 15 05/28/2016 1232   BILITOT 0.55 05/28/2016 1232       Lab Results  Component Value Date   WBC 6.2 05/28/2016   HGB 14.7 05/28/2016   HCT 45.8 05/28/2016   MCV 95.2 05/28/2016   PLT 261 05/28/2016   NEUTROABS 4.1 05/28/2016     ASSESSMENT & PLAN:  Breast cancer of upper-outer quadrant of left female breast ([place]) 05/20/2016: Left breast biopsy 2:30 position: IDC with DCIS, grade 1, ER 100%, PR 90%, Ki-67 5%, HER-2 negative ratio 1.33; is okay  1:30 position: 0.7 cm invasive lobular cancer, grade 1, ER/PR positive HER-2 negative Ki-67 10%, T1 N0 stage IA   Right breast: 1.3 x 0.5 x 0.8 cm linearly oriented mass extends posteriorly to the level of pectoralis muscle, no lymph nodes, 1.8 cm left lower lobe lung nodule  PET CT scan: 07/04/2016: 1.7 cm hypermetabolic nodule medial left lower lobe suspicious for lung cancer. Moderate emphysema, tiny subcentimeter pulmonary nodules too small to characterize.  Lung nodule: Suspicious for lung cancer. I will refer the patient to Dr. Roxan Hockey to evaluate biopsy versus excision. We reviewed the PET CT scan on it provided her a copy of this report. I discussed with her that the lung nodule has nothing to do with the DCIS diagnosis.  Return to clinic after surgery.   No orders of the defined types were placed in this encounter.  The patient has a good understanding of the overall plan. she agrees with it. she will call with any problems that may develop before the next visit  here.   Rulon Eisenmenger, MD 07/07/16

## 2016-07-11 ENCOUNTER — Telehealth: Payer: Self-pay | Admitting: *Deleted

## 2016-07-11 NOTE — Telephone Encounter (Signed)
Spoke to pt concerning appt with thoracic surgeon. Informed pt that referral was placed and she should be called with an appt shortly. Relate doing well and without further needs or questions.

## 2016-07-14 NOTE — Pre-Procedure Instructions (Signed)
Victoria Rice  07/14/2016      Victoria [facility] [address], [place] - [place] [address] Phone: [phone] Fax: [phone]    Your procedure is scheduled on August 16th, Wednesday   Report to [facility] Admitting at  11:30 AM             (posted surgery time 1:30 pm - 2:45 pm)   Call this number if you have problems the morning of surgery:  [phone]   Remember:  Do not eat food or drink liquids after midnight Tuesday.   Take these medicines the morning of surgery with A SIP OF WATER : Synthroid   Do not wear jewelry, make-up or nail polish.  Do not wear lotions, powders, or perfumes.     Do not shave 48 hours prior to surgery.    Do not bring valuables to the hospital.  [facility] is not responsible for any belongings or valuables.  Contacts, dentures or bridgework may not be worn into surgery.  Leave your suitcase in the car.  After surgery it may be brought to your room. For patients admitted to the hospital, discharge time will be determined by your treatment team.  Name and phone number of your driver:     Please read over the following fact sheets that you were given. Pain Booklet and Surgical Site Infection Prevention

## 2016-07-15 ENCOUNTER — Encounter (HOSPITAL_COMMUNITY)
Admission: RE | Admit: 2016-07-15 | Discharge: 2016-07-15 | Disposition: A | Discharge status: Home / Self Care | Payer: Medicare Other | Source: Ambulatory Visit | Attending: General Surgery | Admitting: General Surgery

## 2016-07-15 ENCOUNTER — Encounter (HOSPITAL_COMMUNITY): Payer: Self-pay

## 2016-07-15 DIAGNOSIS — C50912 Malignant neoplasm of unspecified site of left female breast: Secondary | ICD-10-CM | POA: Diagnosis not present

## 2016-07-15 DIAGNOSIS — Z87891 Personal history of nicotine dependence: Secondary | ICD-10-CM | POA: Diagnosis not present

## 2016-07-15 DIAGNOSIS — Z01812 Encounter for preprocedural laboratory examination: Secondary | ICD-10-CM | POA: Diagnosis not present

## 2016-07-15 DIAGNOSIS — Z79899 Other long term (current) drug therapy: Secondary | ICD-10-CM | POA: Diagnosis not present

## 2016-07-15 DIAGNOSIS — C50911 Malignant neoplasm of unspecified site of right female breast: Secondary | ICD-10-CM | POA: Diagnosis not present

## 2016-07-15 DIAGNOSIS — Z01818 Encounter for other preprocedural examination: Secondary | ICD-10-CM | POA: Diagnosis present

## 2016-07-15 DIAGNOSIS — E039 Hypothyroidism, unspecified: Secondary | ICD-10-CM | POA: Diagnosis not present

## 2016-07-15 HISTORY — DX: Reserved for inherently not codable concepts without codable children: IMO0001

## 2016-07-15 HISTORY — DX: Hypothyroidism, unspecified: E03.9

## 2016-07-15 LAB — CBC WITH DIFFERENTIAL/PLATELET
Basophils Absolute: 0 10*3/uL (ref 0.0–0.1)
Basophils Relative: 1 %
Eosinophils Absolute: 0.1 10*3/uL (ref 0.0–0.7)
Eosinophils Relative: 2 %
HEMATOCRIT: 47.3 % — AB (ref 36.0–46.0)
HEMOGLOBIN: 14.8 g/dL (ref 12.0–15.0)
LYMPHS ABS: 1.7 10*3/uL (ref 0.7–4.0)
LYMPHS PCT: 27 %
MCH: 30.4 pg (ref 26.0–34.0)
MCHC: 31.3 g/dL (ref 30.0–36.0)
MCV: 97.1 fL (ref 78.0–100.0)
MONO ABS: 0.5 10*3/uL (ref 0.1–1.0)
MONOS PCT: 8 %
NEUTROS ABS: 3.8 10*3/uL (ref 1.7–7.7)
Neutrophils Relative %: 62 %
Platelets: 268 10*3/uL (ref 150–400)
RBC: 4.87 MIL/uL (ref 3.87–5.11)
RDW: 12.6 % (ref 11.5–15.5)
WBC: 6 10*3/uL (ref 4.0–10.5)

## 2016-07-15 LAB — COMPREHENSIVE METABOLIC PANEL
ALK PHOS: 52 U/L (ref 38–126)
ALT: 16 U/L (ref 14–54)
ANION GAP: 7 (ref 5–15)
AST: 21 U/L (ref 15–41)
Albumin: 4.2 g/dL (ref 3.5–5.0)
BILIRUBIN TOTAL: 0.5 mg/dL (ref 0.3–1.2)
BUN: 9 mg/dL (ref 6–20)
CALCIUM: 9.4 mg/dL (ref 8.9–10.3)
CO2: 31 mmol/L (ref 22–32)
Chloride: 101 mmol/L (ref 101–111)
Creatinine, Ser: 0.74 mg/dL (ref 0.44–1.00)
GFR calc Af Amer: >60 mL/min (ref >60)
Glucose, Bld: 95 mg/dL (ref 65–99)
POTASSIUM: 4.4 mmol/L (ref 3.5–5.1)
Sodium: 139 mmol/L (ref 135–145)
TOTAL PROTEIN: 6.6 g/dL (ref 6.5–8.1)

## 2016-07-15 NOTE — Progress Notes (Signed)
Chart will be referred for anesth. Consult. Pt. Denies ever having any cardiac testing or seeing a cardiologist.

## 2016-07-15 NOTE — Progress Notes (Signed)
Previously a pt. Of Dr. Electa Sniff, states she had an ekg > 10 yrs. Ago at his office.  Pt. Reports that ever since she was told that she has all these "new findings" with her breast & now her lung ,she has anxiety.  She explains several sensations that are new for her- a pain in her upper L shoulder blade last night, that has now resolved , a "funny sensation on her face- to the L side, also a sensation on the back of her head.   BP had prev. Been around 559 systolic per pt., today its 160's .  Pt. Has appt. With Dr. Roxan Hockey tomorrow 07/16/2016.

## 2016-07-16 ENCOUNTER — Encounter: Payer: Self-pay | Admitting: Thoracic Surgery (Cardiothoracic Vascular Surgery)

## 2016-07-16 ENCOUNTER — Institutional Professional Consult (permissible substitution) (INDEPENDENT_AMBULATORY_CARE_PROVIDER_SITE_OTHER): Payer: Medicare Other | Admitting: Thoracic Surgery (Cardiothoracic Vascular Surgery)

## 2016-07-16 ENCOUNTER — Other Ambulatory Visit: Payer: Self-pay | Admitting: *Deleted

## 2016-07-16 VITALS — BP 126/71 | HR 96 | Resp 18 | Ht 60.0 in | Wt 109.0 lb

## 2016-07-16 DIAGNOSIS — R911 Solitary pulmonary nodule: Secondary | ICD-10-CM | POA: Diagnosis not present

## 2016-07-16 NOTE — Progress Notes (Signed)
PCP is Gerrit Heck, MD Referring Provider is Nicholas Lose, MD  Chief Complaint  Patient presents with  . Lung Lesion    Surgical eval, Chest CT 06/23/16, PET Scan 07/04/16    HPI: 82 year old woman sent for consultation regarding a left lower lobe lung nodule.  Mrs. Poynor is an 82 year old widow who has remote history of mild tobacco abuse which she quit in 1975. She also has a history of thyroid nodule and hypothyroidism, skin cancer of the nose, cataracts, and anxiety. She recently saw Dr. Drema Dallas for an annual physical. A breast mass was noted. A biopsy was done of the left breast which showed invasive ductal carcinoma with DCIS. MR of the breast noted a second nodule as well as a left lower lobe lung nodule. A CT of the chest showed a 1.3 x 0.8 cm left lower lobe nodule. PET/CT so the nodule to measure 1.7 cm and it was hypermetabolic with an SUV of 7. There were other subcentimeter lung nodules that were too small to characterize.  She is scheduled to have a lumpectomy and sentinel node biopsy by Dr. Dalbert Batman next Wednesday 8/16.  She was in her usual state of health prior to the recent workup. She does get short of breath with heavy exertion but can walk up a flight of stairs without stopping. She thinks she can walk up 2 flights of stairs without stopping as well. She does not have any chest pain, pressure, or tightness with exertion. She denies cough, hemoptysis, and wheezing. She has not had any recent weight loss. She denies any unusual headaches or visual changes.  Zubrod Score: At the time of surgery this patient's most appropriate activity status/level should be described as: '[x]'$     0    Normal activity, no symptoms '[]'$     1    Restricted in physical strenuous activity but ambulatory, able to do out light work '[]'$     2    Ambulatory and capable of self care, unable to do work activities, up and about >50 % of waking hours                              '[]'$     3    Only  limited self care, in bed greater than 50% of waking hours '[]'$     4    Completely disabled, no self care, confined to bed or chair '[]'$     5    Moribund  Past Medical History:  Diagnosis Date  . Anxiety    recently- related to med. needs   . Cataract   . Hypothyroidism    nodule   . Shortness of breath dyspnea    recently increased   . Skin cancer    nose- tx. with [facility]   . Thyroid nodule   . Vaginal delivery    x2 /w  one being breech    Past Surgical History:  Procedure Laterality Date  . BREAST LUMPECTOMY  1970's   R breast  . EYE SURGERY Right    /w IOL- cataract removed   . VARICOSE VEIN SURGERY Right    w/o stitches on R leg    Family History  Problem Relation Age of Onset  . Prostate cancer Brother   . Lung cancer Brother     Social History Social History  Substance Use Topics  . Smoking status: Former Smoker    Packs/day: 0.75  Types: Cigarettes    Quit date: 05/08/1974  . Smokeless tobacco: Never Used  . Alcohol use 3.6 oz/week    6 Glasses of wine per week    Current Outpatient Prescriptions  Medication Sig Dispense Refill  . cholecalciferol (VITAMIN D) 1000 units tablet Take 1,000 Units by mouth daily.    . naproxen sodium (ANAPROX) 220 MG tablet Take 220 mg by mouth 2 (two) times daily as needed.    [name] SYNTHROID 75 MCG tablet Take 75 mcg by mouth daily before breakfast.      No current facility-administered medications for this visit.     Allergies  Allergen Reactions  . Actonel [Risedronate Sodium]     Back pain     Review of Systems  Constitutional: Negative for activity change, appetite change, chills, fever and unexpected weight change.  HENT: Negative for trouble swallowing and voice change.   Respiratory: Positive for shortness of breath (with heavy exertion). Negative for cough, chest tightness and wheezing.   Cardiovascular: Negative for chest pain, palpitations and leg swelling.  Gastrointestinal: Negative for abdominal pain  and blood in stool.  Endocrine: Negative for polydipsia and polyphagia.       Thyroid nodule  Genitourinary: Negative for difficulty urinating and dysuria.  Musculoskeletal: Negative for arthralgias and back pain.       Varicose veins  Neurological: Negative for seizures, weakness and headaches.  Hematological: Negative for adenopathy. Bruises/bleeds easily.  Psychiatric/Behavioral: The patient is nervous/anxious.   All other systems reviewed and are negative.   BP 126/71 (BP Location: Left Arm, Patient Position: Sitting, Cuff Size: Small)   Pulse 96   Resp 18   Ht 5' (1.524 m)   Wt 109 lb (49.4 kg)   SpO2 96% Comment: RA  BMI 21.29 kg/m  Physical Exam  Constitutional: She is oriented to person, place, and time. No distress.  Thin  HENT:  Head: Normocephalic and atraumatic.  Mouth/Throat: No oropharyngeal exudate.  Eyes: Conjunctivae and EOM are normal. No scleral icterus.  Neck: Neck supple. No tracheal deviation present. No thyromegaly present.  Cardiovascular: Normal rate, regular rhythm, normal heart sounds and intact distal pulses.  Exam reveals no gallop and no friction rub.   No murmur heard. Pulmonary/Chest: Effort normal and breath sounds normal. No respiratory distress. She has no wheezes. She has no rales.  Abdominal: Soft. She exhibits no distension. There is no tenderness.  Musculoskeletal: Normal range of motion. She exhibits no edema or deformity.  Lymphadenopathy:    She has no cervical adenopathy.  Neurological: She is alert and oriented to person, place, and time. No cranial nerve deficit.  Motor grossly intact  Skin: Skin is warm and dry.  Psychiatric: She has a normal mood and affect.  Vitals reviewed.    Diagnostic Tests: CT CHEST WITH CONTRAST  TECHNIQUE: Multidetector CT imaging of the chest was performed during intravenous contrast administration.  CONTRAST:  85m ISOVUE-300 IOPAMIDOL (ISOVUE-300) INJECTION 61%  COMPARISON:   None.  FINDINGS: Mediastinum/Lymph Nodes: Normal heart size. No pericardial effusion. There is aortic atherosclerosis. Calcification in the LAD coronary artery noted. The trachea appears patent and is midline. Unremarkable appearance of the esophagus. No axillary or supraclavicular adenopathy.  Lungs/Pleura: No pulmonary mass, infiltrate, or effusion. No pleural effusion identified. Diffuse bronchial wall thickening identified. There is mild right middle lobe bronchiectasis. Within the left lower lobe there is a pulmonary nodule which measures 1.3 x 0.8 cm, image 95 of series 2. Nonspecific pulmonary nodule in the right upper  lobe measures 4 mm, image 63 of series 5. Within the right lower lobe there is a small nodule which measures 4 mm, image 78 of series 5.  Upper abdomen: Focal wedge-shaped area of peripheral hyper attenuation is identified within the right lobe, image 142 of series 2. The adrenal glands are normal. The spleen and kidneys are negative. The visualized portions of the pancreas are negative.  Musculoskeletal: There is mild multi level thoracic spondylosis identified. No aggressive lytic or sclerotic bone lesion. Gas noted within the inferior aspect of the right breast.  IMPRESSION: 1. There is a suspicious nodule in the left lower lobe which measures up to 1.3 cm. In this patient who is at increased risk further evaluation with PET-CT is advised. 2. Right middle lobe bronchiectasis is identified and is favored to represent sequelae chronic inflammation/infection. 3. Aortic atherosclerosis and coronary artery calcification.   Electronically Signed   By: Kerby Moors M.D.   On: 06/23/2016 09:48 NUCLEAR MEDICINE PET SKULL BASE TO THIGH TECHNIQUE: 5.5 mCi F-18 FDG was injected intravenously. Full-ring PET imaging was performed from the skull base to thigh after the radiotracer. CT data was obtained and used for attenuation correction and  anatomic localization. FASTING BLOOD GLUCOSE:  Value: 94 mg/dl COMPARISON:  Chest CT on 06/23/2016 FINDINGS: NECK No hypermetabolic lymph nodes in the neck. CHEST No hypermetabolic mediastinal or hilar nodes. No hypermetabolic axillary lymph nodes. A 1.7 x 0.9 cm pulmonary nodule in the medial left lower lobe on image 45/8. This is hypermetabolic, with SUV max of 7.0. Other sub-cm pulmonary nodules seen in both lungs show no associated metabolic activity but are too small to characterize by PET. Moderate emphysema again noted. No evidence of pleural effusion. Mild LAD and left circumflex coronary artery calcification noted. Aortic atherosclerosis noted. Aberrant origin of right subclavian artery again noted. ABDOMEN/PELVIS No abnormal hypermetabolic activity within the liver, pancreas, adrenal glands, or spleen. No hypermetabolic lymph nodes in the abdomen or pelvis. Tiny calcified gallstones are seen, without evidence of cholecystitis. Aortic atherosclerosis noted. Several uterine fibroids are seen, some which are partially calcified. A simple appearing cystic lesion is seen in the right adnexa which measures 5.4 x 6.1 cm on image 153/4. This shows absence of metabolic activity, suggesting a benign etiology. Colonic diverticulosis noted, without evidence of diverticulitis. SKELETON No focal hypermetabolic activity to suggest skeletal metastasis. IMPRESSION: 1.7 cm hypermetabolic nodule in the medial left lower lobe, suspicious for primary bronchogenic carcinoma. No definite evidence of thoracic nodal or distant metastatic disease. Moderate emphysema. Tiny sub-cm bilateral pulmonary nodules are too small to characterize by PET but are likely postinflammatory in etiology ; continued attention recommended on follow-up CT. 6 cm simple appearing cystic lesion in right adnexa, without metabolic activity. This is consistent with a benign etiology. Recommend correlation with tumor  markers, and continued followup by CT or ultrasound. Small uterine fibroids also noted. Other incidental findings include aortic atherosclerosis, coronary artery calcification, moderate emphysema, aberrant origin of right subclavian artery, cholelithiasis and colonic diverticulosis. Electronically Signed   By: Earle Gell M.D.   On: 07/04/2016 15:00  I personally reviewed the CT chest and PET/CT and concur with the findings as noted above.  Impression: Mrs. Mckowen is an 82 year old woman with newly discovered left breast cancer and a newly discovered left lower lobe lung nodule. The lung nodule is approximately 1 x 1.7 cm and is hypermetabolic on PET CT. It is highly suspicious for a new primary bronchogenic carcinoma. She has only a  mild smoking history and quit in 1975. Interestingly her brother who also had a rather limited smoking history also had lung cancer.  The lung nodule suspicious enough that it has to be considered a lung cancer unless he can be proven otherwise. Options for workup included bronchoscopic biopsy or CT-guided biopsy. If either of those tests were negative I would not be comfortable watching this nodule given its size and appearance on PET/CT. Therefore I think the best option is to proceed with left video-assisted thoracoscopy for wedge resection and then lobectomy if the nodule is positive. This would yield a definitive diagnosis as well as definitive treatment at the same setting.  The confounding variable is the already diagnosed breast cancer. She is scheduled to have surgery for that next week. My understanding is that the surgery would be relatively limited with breast conservation and sentinel node biopsy. If that is truly the case then I think she could go ahead with that and then within a couple of weeks we would be able to proceed with the lung surgery. I spoke with Dr. Lindi Adie regarding that issue. Unfortunately Dr. Dalbert Batman is not available today.  I did  describe the proposed operation to Mrs. Mccuistion in detail. We discussed the general nature of the procedure, the need for general anesthesia, the incisions to be used, the drainage tubes postoperatively, expected hospital stay, and the overall recovery. I reviewed the indications, risks, benefits, and alternatives. She understands the risks include, but are not limited to death, MI, DVT, PE, bleeding, possible need for transfusion, infection, stroke, prolonged air leak, cardiac arrhythmias, as well as the possibility of other unforeseeable complications.  Plan: Proceed with breast surgery.  Pulmonary function testing with and without bronchodilators.  Plan for lung resection in approximately 3-4 weeks  Melrose Nakayama, MD [facility] [phone]

## 2016-07-17 ENCOUNTER — Other Ambulatory Visit: Payer: Self-pay | Admitting: General Surgery

## 2016-07-17 DIAGNOSIS — C50912 Malignant neoplasm of unspecified site of left female breast: Principal | ICD-10-CM

## 2016-07-17 DIAGNOSIS — C50911 Malignant neoplasm of unspecified site of right female breast: Secondary | ICD-10-CM

## 2016-07-17 NOTE — Progress Notes (Signed)
Anesthesia Chart Review:  Pt is an 82 year old female scheduled for R breast lumpectomy with radioactive seed localization and L breast lumpectomy with double needle localization on 07/23/2016 with Fanny Skates, MD.   PMH includes:  Hypothyroidism, breast cancer. Former smoker. BMI 21  Medications include: synthroid  Preoperative labs reviewed.    EKG 07/15/16: NSR. Possible Left atrial enlargement  If no changes, I anticipate pt can proceed with surgery as scheduled.   Willeen Cass, FNP-BC [facility]/Anesthesiology Phone: [phone] 07/17/2016 2:35 PM

## 2016-07-18 ENCOUNTER — Ambulatory Visit (HOSPITAL_COMMUNITY)
Admission: RE | Admit: 2016-07-18 | Discharge: 2016-07-18 | Disposition: A | Discharge status: Home / Self Care | Payer: Medicare Other | Source: Ambulatory Visit | Attending: Thoracic Surgery (Cardiothoracic Vascular Surgery) | Admitting: Thoracic Surgery (Cardiothoracic Vascular Surgery)

## 2016-07-18 DIAGNOSIS — E039 Hypothyroidism, unspecified: Secondary | ICD-10-CM | POA: Insufficient documentation

## 2016-07-18 DIAGNOSIS — Z01812 Encounter for preprocedural laboratory examination: Secondary | ICD-10-CM | POA: Insufficient documentation

## 2016-07-18 DIAGNOSIS — Z87891 Personal history of nicotine dependence: Secondary | ICD-10-CM | POA: Insufficient documentation

## 2016-07-18 DIAGNOSIS — Z79899 Other long term (current) drug therapy: Secondary | ICD-10-CM | POA: Insufficient documentation

## 2016-07-18 DIAGNOSIS — C50912 Malignant neoplasm of unspecified site of left female breast: Secondary | ICD-10-CM | POA: Diagnosis not present

## 2016-07-18 DIAGNOSIS — C50911 Malignant neoplasm of unspecified site of right female breast: Secondary | ICD-10-CM | POA: Insufficient documentation

## 2016-07-18 DIAGNOSIS — Z01818 Encounter for other preprocedural examination: Secondary | ICD-10-CM | POA: Insufficient documentation

## 2016-07-18 DIAGNOSIS — R911 Solitary pulmonary nodule: Secondary | ICD-10-CM

## 2016-07-18 LAB — PULMONARY FUNCTION TEST
DL/VA % pred: 167 %
DL/VA: 6.85 ml/min/mmHg/L
DLCO unc % pred: 80 %
DLCO unc: 14.13 ml/min/mmHg
FEF 25-75 Post: 0.37 L/sec
FEF 25-75 Pre: 0.19 L/sec
FEF2575-%CHANGE-POST: 98 %
FEF2575-%PRED-POST: 37 %
FEF2575-%Pred-Pre: 18 %
FEV1-%CHANGE-POST: 27 %
FEV1-%PRED-PRE: 33 %
FEV1-%Pred-Post: 42 %
FEV1-POST: 0.6 L
FEV1-Pre: 0.47 L
FEV1FVC-%CHANGE-POST: -1 %
FEV1FVC-%Pred-Pre: 66 %
FEV6-%Change-Post: 33 %
FEV6-%PRED-POST: 67 %
FEV6-%Pred-Pre: 50 %
FEV6-PRE: 0.89 L
FEV6-Post: 1.19 L
FEV6FVC-%Change-Post: 1 %
FEV6FVC-%PRED-PRE: 103 %
FEV6FVC-%Pred-Post: 104 %
FVC-%CHANGE-POST: 29 %
FVC-%PRED-POST: 65 %
FVC-%Pred-Pre: 50 %
FVC-Post: 1.24 L
FVC-Pre: 0.96 L
POST FEV1/FVC RATIO: 48 %
PRE FEV6/FVC RATIO: 96 %
Post FEV6/FVC ratio: 97 %
Pre FEV1/FVC ratio: 49 %
RV % PRED: 170 %
RV: 3.76 L
TLC % pred: 117 %
TLC: 5.06 L

## 2016-07-18 MED ORDER — ALBUTEROL SULFATE (2.5 MG/3ML) 0.083% IN NEBU
2.5000 mg | INHALATION_SOLUTION | Freq: Once | RESPIRATORY_TRACT | Status: AC
  Administered 2016-07-18: 2.5 mg via RESPIRATORY_TRACT

## 2016-07-20 NOTE — H&P (Signed)
Victoria Rice. Lempke Location: [facility] Patient #: [id number] DOB: 20-Jun-1931 Widowed / Language: Cleophus Molt / Race: White Female        History of Present Illness  .  The patient is a 82 year old female who presents with a complaint of bilateral breast cancer. This is a very pleasant, independent 82 year old Caucasian female who returns to see me for a second visit and to plan definitive surgery for her bilateral breast cancer. Dr. Leighton Ruff is her PCP. She was seen in the [facility] on May 28, 2016 by Dr. Lindi Adie, Dr. Sondra Come, and me.      Last mammogram 10 years ago. Recent imaging identified 2 suspicious masses in the upper outer quadrant of the left breast ultrasound of the left axilla was negative. They identified a 7 mm mass in the left breast at the 2:30 position, 4 cm from the nipple, posteriorly, and biopsy showed invasive ductal carcinoma, grade 1, receptor positive, HER-2 negative. The clip did not deploy normally. They also found a 4 mm mass in the left breast at the 1 o'clock position, 4 cm from the nipple, spiculated, anterior, 2.8 cm away from the other mass. Biopsy showed invasive lobular carcinoma, receptor positive, HER-2 negative.  Subsequent MRI shows a small density in the right breast, lower outer quadrant which was biopsied and showed ductal carcinoma in situ, estrogen receptor pending. The MRI also showed a nodule in the left lower lobe of the lung. CT chest shows a 1.3 cm nodule in the left lower lobe of the lung. Dr. Lindi Adie has ordered a PET/CT of the patient is aware of this.  The patient is here with her daughter to discuss options. She is aware that national guidelines including lumpectomy or mastectomy on either side. Dr. Lindi Adie states that he does not need a sentinel lymph node. I have advised her that I do not think there is a survival advantage to the mastectomy. Because her breasts are small she will suffer some noticeable volume  loss in the upper outer quadrant of the left breast. This does not bother her. She may not need radiation therapy in either case. She would like to recover from the surgery as quickly as possible but would not like to compromise her care. We talked for a long time and we decided to do bilateral mastectomy.       What were going to need to do is schedule a second look ultrasound of the left breast and placement of a second marker clip 2 or 3 days preop. On the same day will ask them to put a radioactive seed in the right breast. On the day of surgery she will need to have a double wire bracketed localization of the upper outer quadrant of the left breast and we will proceed with bilateral lumpectomies as an outpatient. I discussed indications, details, techniques, and numerous risk of the surgery with the patient and her daughter. They're aware of the risk of bleeding, infection, reoperation for positive margins, nerve damage and chronic pain, cosmetic deformity, misalignment of the nipples, and other unforeseen problems. They understand that Dr. Lindi Adie does not need for [place] to do any lymph node surgery as she is not a candidate for chemotherapy. Hopefully we can get by with lumpectomy and antiestrogen therapy. She understands all of these issues. Offer questions were answered. She agrees with this plan.     Comorbidities are minimal. Benign thyroid nodule. Basal cell carcinoma scan. Benign right breast biopsy 1971. Family history  is negative for breast or ovarian or colon or pancreatic cancer. Father died age 44 of coronary artery disease. Mother had coronary artery disease as well. She lives alone but is independent. She is a widow. Family nearby. Denies tobacco. A little alcohol every 3 days or so.   Allergies  No Known Drug Allergies  Medication History  Synthroid (75MCG Tablet, Oral) Active. Vitamin D (Cholecalciferol) (1000UNIT Capsule, Oral) Active. Medications  Reconciled  Vitals   Weight: 109.4 lb Temp.: 98.75F(Oral)  Pulse: 86 (Regular)  P.OX: 91% (Room air)     Physical Exam General Mental Status-Alert. General Appearance-Not in acute distress. Build & Nutrition-Well nourished. Posture-Normal posture. Gait-Normal.  Head and Neck Note: Supple. No adenopathy. Tiny nodule at thyroid isthmus. No jugular venous distention.   Chest and Lung Exam Chest and lung exam reveals -on auscultation, normal breath sounds, no adventitious sounds and normal vocal resonance.  Breast Note: Breasts are very small. Ecchymoses are resolving. Small palpable hematoma upper outer quadrant almost resolved. No other masses or skin changes. No axillary adenopathy.   Cardiovascular Cardiovascular examination reveals -normal heart sounds, regular rate and rhythm with no murmurs and femoral artery auscultation bilaterally reveals normal pulses, no bruits, no thrills.  Abdomen Inspection Inspection of the abdomen reveals - No Hernias. Palpation/Percussion Palpation and Percussion of the abdomen reveal - Soft, Non Tender, No Rigidity (guarding), No hepatosplenomegaly and No Palpable abdominal masses.  Neurologic Neurologic evaluation reveals -alert and oriented x 3 with no impairment of recent or remote memory, normal attention span and ability to concentrate, normal sensation and normal coordination.  Musculoskeletal Normal Exam - Bilateral-Upper Extremity Strength Normal and Lower Extremity Strength Normal.    Assessment & Plan PRIMARY CANCER OF UPPER OUTER QUADRANT OF LEFT FEMALE BREAST (C50.412)   Recent MRI and biopsy of your right breast shows a small area of ductal carcinoma in situ, lower outer quadrant of the right breast. The standard of care in this situation is a conservative lumpectomy with radioactive seed localization  The MRI showed the abnormalities in the upper outer quadrant of the left breast, but no  other abnormalities. The options on the left side are a mastectomy or a double wire lumpectomy. There is no survival advantage to a mastectomy. Removing both of the cancers in the upper outer left breast will leave a volume loss as we discussed. I think that lumpectomy on both sides will be equivalent to mastectomy on both sides in terms of cancer treatment and survival, but it will take much longer to recover from the mastectomy. Dr. Darrel Hoover bias is to do bilateral lumpectomy. You have agreed to this  Dr. Lindi Adie will workup the small nodule in your left lower lobe of your lung and this PET/CT has been scheduled.  As you recall, have 2 small cancers in the upper outer quadrant of the left brest, but when you had a biopsies one of the clips did not deploy. To localize the second cancer, the radiologist states that they would like you back for a second look ultrasound and placement of a clip in the second cancer a few days prior to the surgery   You will be scheduled for placement of a radioactive seed in the right breast and placement of the second marker clip in the left breast 2 - 3 days prior to your surgery Then, on the day of surgery you will go back to the breast center and will have 2 wires placed on the left, bracketing the 2 cancers  which will act as a guide for me in the operating room On the day of surgery you will then have bilateral lumpectomies and you should be able to go home the same day The pathology reports usually come out 3 business days later. There is an 8-10% chance that you may have to have a second operation if the margins are positive.  We have discussed the indications, techniques, and numerous risk of the surgery in detail with you and your daughter.  PULMONARY NODULE (R91.1) Impression: 1.3 cm nodule left lower lobe seen on CT. Dr. Lindi Adie has scheduled PET CT scan.  PRIMARY CANCER OF LOWER OUTER QUADRANT OF RIGHT FEMALE BREAST (C50.511) Impression: Small area  ductal carcinoma in situ. Receptor status pending  THYROID NODULE (E04.1)    Edsel Petrin. Dalbert Batman, M.D., [facility] General and Minimally invasive Surgery Breast and Colorectal Surgery Office:   [phone] Pager:   [phone]

## 2016-07-21 ENCOUNTER — Ambulatory Visit
Admission: RE | Admit: 2016-07-21 | Discharge: 2016-07-21 | Disposition: A | Discharge status: Home / Self Care | Payer: Medicare Other | Source: Ambulatory Visit | Attending: General Surgery | Admitting: General Surgery

## 2016-07-21 ENCOUNTER — Other Ambulatory Visit: Payer: Self-pay | Admitting: General Surgery

## 2016-07-21 ENCOUNTER — Other Ambulatory Visit: Payer: Medicare Other

## 2016-07-21 DIAGNOSIS — C50911 Malignant neoplasm of unspecified site of right female breast: Secondary | ICD-10-CM

## 2016-07-21 DIAGNOSIS — C50912 Malignant neoplasm of unspecified site of left female breast: Principal | ICD-10-CM

## 2016-07-23 ENCOUNTER — Encounter (HOSPITAL_COMMUNITY)
Admission: RE | Disposition: A | Discharge status: Home / Self Care | Payer: Self-pay | Source: Ambulatory Visit | Attending: General Surgery

## 2016-07-23 ENCOUNTER — Ambulatory Visit (HOSPITAL_COMMUNITY)
Admission: RE | Admit: 2016-07-23 | Discharge: 2016-07-23 | Disposition: A | Discharge status: Home / Self Care | Payer: Medicare Other | Source: Ambulatory Visit | Attending: General Surgery | Admitting: General Surgery

## 2016-07-23 ENCOUNTER — Ambulatory Visit
Admission: RE | Admit: 2016-07-23 | Discharge: 2016-07-23 | Disposition: A | Discharge status: Home / Self Care | Payer: Medicare Other | Source: Ambulatory Visit | Attending: General Surgery | Admitting: General Surgery

## 2016-07-23 ENCOUNTER — Ambulatory Visit (HOSPITAL_COMMUNITY): Payer: Medicare Other | Admitting: Vascular Surgery

## 2016-07-23 ENCOUNTER — Encounter (HOSPITAL_COMMUNITY): Payer: Self-pay | Admitting: *Deleted

## 2016-07-23 ENCOUNTER — Ambulatory Visit (HOSPITAL_COMMUNITY): Payer: Medicare Other | Admitting: Certified Registered"

## 2016-07-23 DIAGNOSIS — Z79899 Other long term (current) drug therapy: Secondary | ICD-10-CM | POA: Insufficient documentation

## 2016-07-23 DIAGNOSIS — C50911 Malignant neoplasm of unspecified site of right female breast: Secondary | ICD-10-CM

## 2016-07-23 DIAGNOSIS — C50912 Malignant neoplasm of unspecified site of left female breast: Principal | ICD-10-CM

## 2016-07-23 DIAGNOSIS — D0511 Intraductal carcinoma in situ of right breast: Secondary | ICD-10-CM | POA: Insufficient documentation

## 2016-07-23 DIAGNOSIS — D0512 Intraductal carcinoma in situ of left breast: Secondary | ICD-10-CM | POA: Diagnosis not present

## 2016-07-23 HISTORY — PX: BREAST LUMPECTOMY WITH RADIOACTIVE SEED LOCALIZATION: SHX6424

## 2016-07-23 HISTORY — PX: BREAST LUMPECTOMY: SHX2

## 2016-07-23 HISTORY — PX: BREAST LUMPECTOMY WITH NEEDLE LOCALIZATION: SHX5759

## 2016-07-23 SURGERY — BREAST LUMPECTOMY WITH RADIOACTIVE SEED LOCALIZATION
Anesthesia: General | Site: Breast | Laterality: Right

## 2016-07-23 MED ORDER — GABAPENTIN 300 MG PO CAPS: 300.0000 mg | ORAL_CAPSULE | ORAL | Status: DC

## 2016-07-23 MED ORDER — SODIUM CHLORIDE 0.9% FLUSH: 3.0000 mL | Freq: Two times a day (BID) | INTRAVENOUS | Status: DC

## 2016-07-23 MED ORDER — ACETAMINOPHEN 650 MG RE SUPP
650.0000 mg | RECTAL | Status: DC | PRN
  Filled 2016-07-23: qty 1

## 2016-07-23 MED ORDER — SODIUM CHLORIDE 0.9 % IV SOLN: 250.0000 mL | INTRAVENOUS | Status: DC | PRN

## 2016-07-23 MED ORDER — FENTANYL CITRATE (PF) 100 MCG/2ML IJ SOLN
INTRAMUSCULAR | Status: AC
  Filled 2016-07-23: qty 4

## 2016-07-23 MED ORDER — FENTANYL CITRATE (PF) 100 MCG/2ML IJ SOLN
INTRAMUSCULAR | Status: DC | PRN
  Administered 2016-07-23: 100 ug via INTRAVENOUS
  Administered 2016-07-23 (×2): 50 ug via INTRAVENOUS

## 2016-07-23 MED ORDER — OXYCODONE HCL 5 MG PO TABS: 5.0000 mg | ORAL_TABLET | Freq: Once | ORAL | Status: DC | PRN

## 2016-07-23 MED ORDER — 0.9 % SODIUM CHLORIDE (POUR BTL) OPTIME
TOPICAL | Status: DC | PRN
  Administered 2016-07-23: 1000 mL

## 2016-07-23 MED ORDER — SODIUM CHLORIDE 0.9% FLUSH: 3.0000 mL | INTRAVENOUS | Status: DC | PRN

## 2016-07-23 MED ORDER — MEPERIDINE HCL 25 MG/ML IJ SOLN: 6.2500 mg | INTRAMUSCULAR | Status: DC | PRN

## 2016-07-23 MED ORDER — ACETAMINOPHEN 10 MG/ML IV SOLN
INTRAVENOUS | Status: AC
  Filled 2016-07-23: qty 100

## 2016-07-23 MED ORDER — ONDANSETRON HCL 4 MG/2ML IJ SOLN: 4.0000 mg | Freq: Once | INTRAMUSCULAR | Status: DC | PRN

## 2016-07-23 MED ORDER — CHLORHEXIDINE GLUCONATE CLOTH 2 % EX PADS: 6.0000 | MEDICATED_PAD | Freq: Once | CUTANEOUS | Status: DC

## 2016-07-23 MED ORDER — BUPIVACAINE-EPINEPHRINE (PF) 0.25% -1:200000 IJ SOLN
INTRAMUSCULAR | Status: AC
  Filled 2016-07-23: qty 60

## 2016-07-23 MED ORDER — OXYCODONE HCL 5 MG/5ML PO SOLN: 5.0000 mg | Freq: Once | ORAL | Status: DC | PRN

## 2016-07-23 MED ORDER — MIDAZOLAM HCL 2 MG/2ML IJ SOLN
INTRAMUSCULAR | Status: AC
  Filled 2016-07-23: qty 2

## 2016-07-23 MED ORDER — LIDOCAINE 2% (20 MG/ML) 5 ML SYRINGE
INTRAMUSCULAR | Status: DC | PRN
  Administered 2016-07-23: 80 mg via INTRAVENOUS

## 2016-07-23 MED ORDER — PHENYLEPHRINE HCL 10 MG/ML IJ SOLN
INTRAMUSCULAR | Status: DC | PRN
  Administered 2016-07-23 (×3): 80 ug via INTRAVENOUS

## 2016-07-23 MED ORDER — HYDROMORPHONE HCL 1 MG/ML IJ SOLN: 0.2500 mg | INTRAMUSCULAR | Status: DC | PRN

## 2016-07-23 MED ORDER — CEFAZOLIN SODIUM-DEXTROSE 2-4 GM/100ML-% IV SOLN
INTRAVENOUS | Status: AC
  Filled 2016-07-23: qty 100

## 2016-07-23 MED ORDER — BUPIVACAINE-EPINEPHRINE 0.25% -1:200000 IJ SOLN
INTRAMUSCULAR | Status: DC | PRN
Start: 2016-07-23 — End: 2016-07-23
  Administered 2016-07-23: 8 mL

## 2016-07-23 MED ORDER — PROPOFOL 10 MG/ML IV BOLUS
INTRAVENOUS | Status: DC | PRN
  Administered 2016-07-23: 70 mg via INTRAVENOUS
  Administered 2016-07-23: 130 mg via INTRAVENOUS

## 2016-07-23 MED ORDER — ONDANSETRON HCL 4 MG/2ML IJ SOLN
INTRAMUSCULAR | Status: DC | PRN
  Administered 2016-07-23: 4 mg via INTRAVENOUS

## 2016-07-23 MED ORDER — ACETAMINOPHEN 325 MG PO TABS
650.0000 mg | ORAL_TABLET | ORAL | Status: DC | PRN
  Filled 2016-07-23: qty 2

## 2016-07-23 MED ORDER — ACETAMINOPHEN 500 MG PO TABS
1000.0000 mg | ORAL_TABLET | ORAL | Status: AC
  Administered 2016-07-23: 1000 mg via ORAL

## 2016-07-23 MED ORDER — OXYCODONE HCL 5 MG PO TABS: 5.0000 mg | ORAL_TABLET | ORAL | Status: DC | PRN

## 2016-07-23 MED ORDER — HYDROCODONE-ACETAMINOPHEN 7.5-325 MG PO TABS: 1.0000 | ORAL_TABLET | Freq: Four times a day (QID) | ORAL | 0 refills | Status: DC | PRN

## 2016-07-23 MED ORDER — LACTATED RINGERS IV SOLN
INTRAVENOUS | Status: DC
  Administered 2016-07-23 (×2): via INTRAVENOUS

## 2016-07-23 MED ORDER — FENTANYL CITRATE (PF) 100 MCG/2ML IJ SOLN: 25.0000 ug | INTRAMUSCULAR | Status: DC | PRN

## 2016-07-23 MED ORDER — CEFAZOLIN SODIUM-DEXTROSE 2-4 GM/100ML-% IV SOLN
2.0000 g | INTRAVENOUS | Status: AC
  Administered 2016-07-23: 2 g via INTRAVENOUS

## 2016-07-23 MED ORDER — EPHEDRINE SULFATE 50 MG/ML IJ SOLN
INTRAMUSCULAR | Status: DC | PRN
  Administered 2016-07-23: 15 mg via INTRAVENOUS
  Administered 2016-07-23 (×2): 10 mg via INTRAVENOUS

## 2016-07-23 MED ORDER — LACTATED RINGERS IV SOLN: INTRAVENOUS | Status: DC

## 2016-07-23 SURGICAL SUPPLY — 53 items
ADH SKN CLS APL DERMABOND .7 (GAUZE/BANDAGES/DRESSINGS) ×2
APPLIER CLIP 9.375 MED OPEN (MISCELLANEOUS) ×4
APR CLP MED 9.3 20 MLT OPN (MISCELLANEOUS) ×2
BINDER BREAST LRG (GAUZE/BANDAGES/DRESSINGS) ×2 IMPLANT
BINDER BREAST MEDIUM (GAUZE/BANDAGES/DRESSINGS) ×2 IMPLANT
BLADE SURG 15 STRL LF DISP TIS (BLADE) ×2 IMPLANT
BLADE SURG 15 STRL SS (BLADE) ×4
CANISTER SUCTION 2500CC (MISCELLANEOUS) ×4 IMPLANT
CHLORAPREP W/TINT 26ML (MISCELLANEOUS) ×4 IMPLANT
CLIP APPLIE 9.375 MED OPEN (MISCELLANEOUS) IMPLANT
COVER PROBE W GEL 5X96 (DRAPES) ×4 IMPLANT
COVER SURGICAL LIGHT HANDLE (MISCELLANEOUS) ×4 IMPLANT
DECANTER SPIKE VIAL GLASS SM (MISCELLANEOUS) ×4 IMPLANT
DERMABOND ADVANCED (GAUZE/BANDAGES/DRESSINGS) ×2
DERMABOND ADVANCED .7 DNX12 (GAUZE/BANDAGES/DRESSINGS) ×2 IMPLANT
DEVICE DUBIN SPECIMEN MAMMOGRA (MISCELLANEOUS) ×4 IMPLANT
DRAPE CHEST BREAST 15X10 FENES (DRAPES) ×4 IMPLANT
DRAPE UTILITY XL STRL (DRAPES) ×4 IMPLANT
DRSG PAD ABDOMINAL 8X10 ST (GAUZE/BANDAGES/DRESSINGS) ×4 IMPLANT
ELECT CAUTERY BLADE 6.4 (BLADE) ×4 IMPLANT
ELECT REM PT RETURN 9FT ADLT (ELECTROSURGICAL) ×4
ELECTRODE REM PT RTRN 9FT ADLT (ELECTROSURGICAL) ×2 IMPLANT
GLOVE ECLIPSE 7.0 STRL STRAW (GLOVE) ×2 IMPLANT
GLOVE EUDERMIC 7 POWDERFREE (GLOVE) ×4 IMPLANT
GOWN STRL REUS W/ TWL LRG LVL3 (GOWN DISPOSABLE) ×2 IMPLANT
GOWN STRL REUS W/ TWL XL LVL3 (GOWN DISPOSABLE) ×2 IMPLANT
GOWN STRL REUS W/TWL LRG LVL3 (GOWN DISPOSABLE) ×4
GOWN STRL REUS W/TWL XL LVL3 (GOWN DISPOSABLE) ×4
KIT BASIN OR (CUSTOM PROCEDURE TRAY) ×4 IMPLANT
KIT MARKER MARGIN INK (KITS) ×6 IMPLANT
KIT ROOM TURNOVER OR (KITS) ×4 IMPLANT
NDL HYPO 25GX1X1/2 BEV (NEEDLE) ×2 IMPLANT
NDL HYPO 25X1 1.5 SAFETY (NEEDLE) ×2 IMPLANT
NEEDLE HYPO 25GX1X1/2 BEV (NEEDLE) ×4 IMPLANT
NEEDLE HYPO 25X1 1.5 SAFETY (NEEDLE) ×4 IMPLANT
NS IRRIG 1000ML POUR BTL (IV SOLUTION) ×4 IMPLANT
PACK SURGICAL SETUP 50X90 (CUSTOM PROCEDURE TRAY) ×4 IMPLANT
PAD ABD 8X10 STRL (GAUZE/BANDAGES/DRESSINGS) ×4 IMPLANT
PAD ARMBOARD 7.5X6 YLW CONV (MISCELLANEOUS) ×4 IMPLANT
PENCIL BUTTON HOLSTER BLD 10FT (ELECTRODE) ×6 IMPLANT
SPONGE GAUZE 4X4 12PLY STER LF (GAUZE/BANDAGES/DRESSINGS) ×2 IMPLANT
SPONGE LAP 18X18 X RAY DECT (DISPOSABLE) ×4 IMPLANT
SPONGE LAP 4X18 X RAY DECT (DISPOSABLE) ×4 IMPLANT
SUT MNCRL AB 4-0 PS2 18 (SUTURE) ×4 IMPLANT
SUT SILK 2 0 SH (SUTURE) ×4 IMPLANT
SUT VIC AB 3-0 SH 18 (SUTURE) ×4 IMPLANT
SYR BULB 3OZ (MISCELLANEOUS) ×4 IMPLANT
SYR CONTROL 10ML LL (SYRINGE) ×4 IMPLANT
TOWEL OR 17X24 6PK STRL BLUE (TOWEL DISPOSABLE) ×4 IMPLANT
TOWEL OR 17X26 10 PK STRL BLUE (TOWEL DISPOSABLE) ×4 IMPLANT
TUBE CONNECTING 12'X1/4 (SUCTIONS) ×1
TUBE CONNECTING 12X1/4 (SUCTIONS) ×3 IMPLANT
YANKAUER SUCT BULB TIP NO VENT (SUCTIONS) ×4 IMPLANT

## 2016-07-23 NOTE — Discharge Instructions (Signed)
[facility] °Office Phone Number [phone] ° °BREAST BIOPSY/ PARTIAL MASTECTOMY: POST OP INSTRUCTIONS ° °Always review your discharge instruction sheet given to you by the facility where your surgery was performed. ° °IF YOU HAVE DISABILITY OR FAMILY LEAVE FORMS, YOU MUST BRING THEM TO THE OFFICE FOR PROCESSING.  DO NOT GIVE THEM TO YOUR DOCTOR. ° °1. A prescription for pain medication may be given to you upon discharge.  Take your pain medication as prescribed, if needed.  If narcotic pain medicine is not needed, then you may take acetaminophen (Tylenol) or ibuprofen (Advil) as needed. °2. Take your usually prescribed medications unless otherwise directed °3. If you need a refill on your pain medication, please contact your pharmacy.  They will contact our office to request authorization.  Prescriptions will not be filled after 5pm or on week-ends. °4. You should eat very light the first 24 hours after surgery, such as soup, crackers, pudding, etc.  Resume your normal diet the day after surgery. °5. Most patients will experience some swelling and bruising in the breast.  Ice packs and a good support bra will help.  Swelling and bruising can take several days to resolve.  °6. It is common to experience some constipation if taking pain medication after surgery.  Increasing fluid intake and taking a stool softener will usually help or prevent this problem from occurring.  A mild laxative (Milk of Magnesia or Miralax) should be taken according to package directions if there are no bowel movements after 48 hours. °7. Unless discharge instructions indicate otherwise, you may remove your bandages 24-48 hours after surgery, and you may shower at that time.  You may have steri-strips (small skin tapes) in place directly over the incision.  These strips should be left on the skin for 7-10 days.  If your surgeon used skin glue on the incision, you may shower in 24 hours.  The glue will flake off over the  next 2-3 weeks.  Any sutures or staples will be removed at the office during your follow-up visit. °8. ACTIVITIES:  You may resume regular daily activities (gradually increasing) beginning the next day.  Wearing a good support bra or sports bra minimizes pain and swelling.  You may have sexual intercourse when it is comfortable. °a. You may drive when you no longer are taking prescription pain medication, you can comfortably wear a seatbelt, and you can safely maneuver your car and apply brakes. °b. RETURN TO WORK:  ______________________________________________________________________________________ °9. You should see your doctor in the office for a follow-up appointment approximately two weeks after your surgery.  Your doctor’s nurse will typically make your follow-up appointment when she calls you with your pathology report.  Expect your pathology report 2-3 business days after your surgery.  You may call to check if you do not hear from us after three days. °10. OTHER INSTRUCTIONS: _______________________________________________________________________________________________ _____________________________________________________________________________________________________________________________________ °_____________________________________________________________________________________________________________________________________ °_____________________________________________________________________________________________________________________________________ ° °WHEN TO CALL YOUR DOCTOR: °1. Fever over 101.0 °2. Nausea and/or vomiting. °3. Extreme swelling or bruising. °4. Continued bleeding from incision. °5. Increased pain, redness, or drainage from the incision. ° °The clinic staff is available to answer your questions during regular business hours.  Please don’t hesitate to call and ask to speak to one of the nurses for clinical concerns.  If you have a medical emergency, go to the nearest  emergency room or call 911.  A surgeon from [facility] is always on call at the hospital. ° °For further questions, please visit [email or web address]  °

## 2016-07-23 NOTE — Interval H&P Note (Signed)
History and Physical Interval Note:  07/23/2016 12:16 PM  Victoria Rice  has presented today for surgery, with the diagnosis of BILATERAL BREAST CANCER  The various methods of treatment have been discussed with the patient and family. After consideration of risks, benefits and other options for treatment, the patient has consented to  Procedure(s): RIGHT BREAST LUMPECTOMY WITH RADIOACTIVE SEED LOCALIZATION (Right) LEFT BREAST LUMPECTOMY WITH DOUBLE NEEDLE LOCALIZATION (Left) as a surgical intervention .  The patient's history has been reviewed, patient examined, no change in status, stable for surgery.  I have reviewed the patient's chart and labs.  Questions were answered to the patient's satisfaction.     Adin Hector

## 2016-07-23 NOTE — Transfer of Care (Signed)
Immediate Anesthesia Transfer of Care Note  Patient: Victoria Rice  Procedure(s) Performed: Procedure(s): RIGHT BREAST LUMPECTOMY WITH RADIOACTIVE SEED LOCALIZATION (Right) LEFT BREAST LUMPECTOMY WITH DOUBLE NEEDLE LOCALIZATION (Left)  Patient Location: PACU  Anesthesia Type:General  Level of Consciousness: awake, alert , oriented and patient cooperative  Airway & Oxygen Therapy: Patient Spontanous Breathing and Patient connected to nasal cannula oxygen  Post-op Assessment: Report given to RN and Post -op Vital signs reviewed and stable  Post vital signs: Reviewed and stable  Last Vitals:  Vitals:   07/23/16 1113  BP: (!) 166/60  Pulse: 79  Resp: 18  Temp: 36.8 C    Last Pain:  Vitals:   07/23/16 1113  TempSrc: Oral      Patients Stated Pain Goal: 2 (58/59/29 2446)  Complications: No apparent anesthesia complications

## 2016-07-23 NOTE — Anesthesia Postprocedure Evaluation (Signed)
Anesthesia Post Note  Patient: Victoria Rice  Procedure(s) Performed: Procedure(s) (LRB): RIGHT BREAST LUMPECTOMY WITH RADIOACTIVE SEED LOCALIZATION (Right) LEFT BREAST LUMPECTOMY WITH DOUBLE NEEDLE LOCALIZATION (Left)  Patient location during evaluation: PACU Anesthesia Type: General Level of consciousness: awake and alert Pain management: pain level controlled Vital Signs Assessment: post-procedure vital signs reviewed and stable Respiratory status: spontaneous breathing, nonlabored ventilation, respiratory function stable and patient connected to nasal cannula oxygen Cardiovascular status: blood pressure returned to baseline and stable Postop Assessment: no signs of nausea or vomiting Anesthetic complications: no    Last Vitals:  Vitals:   07/23/16 1430 07/23/16 1445  BP: 135/70 (!) 145/76  Pulse: 90 80  Resp: 10 15  Temp: 36.6 C     Last Pain:  Vitals:   07/23/16 1430  TempSrc:   PainSc: 0-No pain                 Doral Ventrella A

## 2016-07-23 NOTE — OR Nursing (Signed)
Dr. Enriqueta Shutter at the breast center read the xray from the [facility] and stated that the clip for the left breast was at I-10 and the coil was D-8.  Taken at [address] [place].

## 2016-07-23 NOTE — Op Note (Addendum)
Patient Name:           Victoria Rice   Date of Surgery:        07/23/2016  Pre op Diagnosis:     Ductal carcinoma in situ right breast, lower outer quadrant                                      Multifocal invasive carcinoma left breast, upper outer quadrant  Post op Diagnosis:    Same  Procedure:                 Right breast lumpectomy with radioactive seed localization                                      Left breast lumpectomy with double wire needle localization  Surgeon:                     Edsel Petrin. Dalbert Batman, M.D., FACS  Assistant:                      OR staff  Operative Indications:   The patient is a 82 year old female who presents with a complaint of bilateral breast cancer.  Dr. Leighton Ruff is her PCP. She was seen in the [facility] on May 28, 2016 by Dr. Lindi Adie, Dr. Sondra Come, and me.      Last mammogram 10 years ago. Recent imaging identified 2 suspicious masses in the upper outer quadrant of the left breast ultrasound of the left axilla was negative. They identified a 7 mm mass in the left breast at the 2:30 position, 4 cm from the nipple, posteriorly, and biopsy showed invasive ductal carcinoma, grade 1, receptor positive, HER-2 negative. The clip did not deploy normally, but was later found embedded in the pectoralis major muscle. They also found a 4 mm mass in the left breast at the 1 o'clock position, 4 cm from the nipple, spiculated, anterior, 2.8 cm away from the other mass. Biopsy showed invasive lobular carcinoma, receptor positive, HER-2 negative.    Subsequent MRI shows a small density in the right breast, lower outer quadrant which was biopsied and showed ductal carcinoma in situ,  The MRI also showed a nodule in the left lower lobe of the lung. CT chest shows a 1.3 cm nodule in the left lower lobe of the lung. Dr. Lindi Adie has ordered a PET/CT of the patient is aware of this.  Dr. Roxan Hockey plans a thoracoscopic resection of this mass in about 3 weeks. I  had several discussions with the patient and her daughter regarding management of her bilateral breast cancer.    . She is aware that national guidelines including lumpectomy or mastectomy on either side. Dr. Lindi Adie states that he does not need a sentinel lymph node. I have advised her that I do not think there is a survival advantage to the mastectomy. Because her breasts are small she will suffer some noticeable volume loss in the upper outer quadrant of the left breast. This does not bother her. She may not need radiation therapy in either case. She would like to recover from the surgery as quickly as possible but would not like to compromise her care. We talked for a long time and we decided to do bilateral lumpectomy.  she has undergone radioactive seed placement in the right breast 2 days ago.  She has a double wire bracketed localization of the 2 breast cancers in the left breast she is brought to the operating room electively.         Operative Findings:      The breasts were small and thin.  On the right side I basically performed a lumpectomy through an inframammary hidden scar and I took all the breast tissue out from the skin to the pectoralis muscle.  On the right side the anterior margin is the skin in the posterior margin is the muscle.  On the left side she had 2 wires.  The radiologist stated that the posterior wire was through the cancer but not behind it.  On the left side I also performed a resection taking essentially all the breast tissue from the skin down to the pectoralis fascia and so on the left side the anterior margin is the scan in the posterior margin is the muscle.     On the right side the specimen mammogram looked good with the marker clip and seed in the center of the specimen.  On the left side we had both wires and both clips within the center of the specimen and we felt that we had done a wide local resection on both sides.   Procedure in Detail:           The patient was interviewed in the holding area.  Using the neoprobe I can hear the radioactive seed in the right breast at the inferior pole at about the 6:30 position.  The patient was taken the operating room and underwent general anesthesia with LMA device.  Both breasts were prepped and draped in a sterile fashion.  Surgical timeout was performed.  Intravenous antibiotics were given.  0.5% Marcaine with epinephrine was used as local infiltration anesthetic.      On the right side I made an inframammary incision, a hidden scar technique.  I raised the skin flaps superiorly off of the breast tissue and dissected widely around the radioactive seed.  The breast tissue was thin.  The lumpectomy specimen was then resected off of the pectoralis muscle taking the pectoralis fascia with it.  The specimen was marked with silk sutures and a 6 color ink kit to orient the pathologist.  The specimen mammogram looked good as described above.  The wound was irrigated and cauterized.  5 metal clips were placed at the cardinal positions of  the lumpectomy cavity.  The breast tissues were reapproximated to each other and down to the pectoralis muscle using interrupted 3-0 Vicryl sutures and the skin was closed with a running subcuticular 4-0 Monocryl and Dermabond.     On the left side both wires were seen to enter the breast skin laterally and supero-laterally.  I made a curvilinear incision in the upper outer quadrant through the insertion sites of the wire.  I dissected down into the breast tissue and widely around the wires.  I took the dissection all the way through the pectoralis fascia.  The specimen was removed and marked with sutures and ink.  The specimen mammogram looked very good as described above.  The specimen was sent to the lab.  After cauterizing bleeders the wound was irrigated and the tissues closed in multiple layers with 3-0 Vicryl and the skin closed with running subcuticular 4-0 Monocryl and Dermabond.   Dry bandages and a breast binder were placed.  The patient tolerated the procedure well was taken to PACU in stable condition.  EBL 25 mL or less.  Counts correct.  Complications none.      Edsel Petrin. Dalbert Batman, M.D., FACS General and Minimally Invasive Surgery Breast and Colorectal Surgery  07/23/2016 2:32 PM

## 2016-07-23 NOTE — Anesthesia Preprocedure Evaluation (Signed)
Anesthesia Evaluation  Patient identified by MRN, date of birth, ID band  Reviewed: Allergy & Precautions, NPO status , Patient's Chart, lab work & pertinent test results  Airway Mallampati: I  TM Distance: >3 FB Neck ROM: Full    Dental  (+) Teeth Intact, Dental Advisory Given   Pulmonary former smoker,    breath sounds clear to auscultation       Cardiovascular  Rhythm:Regular Rate:Normal     Neuro/Psych    GI/Hepatic   Endo/Other  Hypothyroidism   Renal/GU      Musculoskeletal   Abdominal   Peds  Hematology   Anesthesia Other Findings   Reproductive/Obstetrics                             Anesthesia Physical Anesthesia Plan  ASA: II  Anesthesia Plan: General   Post-op Pain Management:    Induction: Intravenous  Airway Management Planned: LMA  Additional Equipment:   Intra-op Plan:   Post-operative Plan: Extubation in OR  Informed Consent: I have reviewed the patients History and Physical, chart, labs and discussed the procedure including the risks, benefits and alternatives for the proposed anesthesia with the patient or authorized representative who has indicated his/her understanding and acceptance.   Dental advisory given  Plan Discussed with: CRNA, Anesthesiologist and Surgeon  Anesthesia Plan Comments:         Anesthesia Quick Evaluation

## 2016-07-23 NOTE — Anesthesia Procedure Notes (Signed)
Procedure Name: LMA Insertion Date/Time: 07/23/2016 1:04 PM Performed by: Salli Quarry Breyon Blass Pre-anesthesia Checklist: Patient identified, Emergency Drugs available, Suction available and Patient being monitored Patient Re-evaluated:Patient Re-evaluated prior to inductionOxygen Delivery Method: Circle System Utilized Preoxygenation: Pre-oxygenation with 100% oxygen Intubation Type: IV induction Ventilation: Mask ventilation without difficulty LMA: LMA inserted LMA Size: 5.0 Number of attempts: 1 Airway Equipment and Method: Bite block Placement Confirmation: positive ETCO2 Tube secured with: Tape Dental Injury: Teeth and Oropharynx as per pre-operative assessment

## 2016-07-24 ENCOUNTER — Encounter (HOSPITAL_COMMUNITY): Payer: Self-pay | Admitting: General Surgery

## 2016-07-25 NOTE — Progress Notes (Signed)
Inform patient of Pathology report,.  Tell her that both cancers seem to be adequately resected, and she should not need any further surgery.  hmi

## 2016-07-30 ENCOUNTER — Encounter: Payer: Self-pay | Admitting: Hematology and Oncology

## 2016-07-30 ENCOUNTER — Telehealth: Payer: Self-pay | Admitting: Hematology and Oncology

## 2016-07-30 ENCOUNTER — Ambulatory Visit (HOSPITAL_BASED_OUTPATIENT_CLINIC_OR_DEPARTMENT_OTHER): Payer: Medicare Other | Admitting: Hematology and Oncology

## 2016-07-30 DIAGNOSIS — R911 Solitary pulmonary nodule: Secondary | ICD-10-CM | POA: Insufficient documentation

## 2016-07-30 DIAGNOSIS — C50412 Malignant neoplasm of upper-outer quadrant of left female breast: Secondary | ICD-10-CM | POA: Diagnosis not present

## 2016-07-30 DIAGNOSIS — D0511 Intraductal carcinoma in situ of right breast: Secondary | ICD-10-CM | POA: Diagnosis not present

## 2016-07-30 NOTE — Assessment & Plan Note (Signed)
Right lumpectomy: DCIS 0.2 cm, margins negative, ER 95%, PR 70%; Tis N0 stage 0  Left lumpectomy: IDC with DCIS 0.8 cm ER 100%, PR 90%, HER-2 negative, Ki-67 5% and 0.5 cm ER 95%, PR 70%, HER-2 negative, Ki-67 10%, T1b NX stage I a multifocal  Treatment plan: 1. Discuss adjuvant radiation therapy followed by 2. adjuvant antiestrogen therapy

## 2016-07-30 NOTE — Telephone Encounter (Signed)
appt made per LOS avs printed

## 2016-07-30 NOTE — Assessment & Plan Note (Signed)
PET scan      1.7 cm hypermetabolic nodule medial left lower lobe suspicious for lung cancer. Moderate emphysema, tiny subcentimeter pulmonary nodules too small to characterize   Patient had pulmonary function testing which were not very satisfactory according to Dr. Roxan Hockey. He may recommend stereotactic radiosurgery instead of lobectomy. Patient has an appointment to see Dr. Roxan Hockey tomorrow. I strongly suspect that this may be a primary lung cancer.

## 2016-07-30 NOTE — Progress Notes (Signed)
Patient Care Team: Leighton Ruff, MD as PCP - General (Family Medicine) Nicholas Lose, MD as Consulting Physician (Hematology and Oncology) Fanny Skates, MD as Consulting Physician (General Surgery) Gery Pray, MD as Consulting Physician (Radiation Oncology)  DIAGNOSIS: Breast cancer of upper-outer quadrant of left female breast [facility])   Staging form: Breast, AJCC 7th Edition   - Clinical stage from 05/28/2016: Stage IA (T1b, N0, M0) - Unsigned  SUMMARY OF ONCOLOGIC HISTORY:   Breast cancer of upper-outer quadrant of left female breast ([place])   05/20/2016 Initial Diagnosis    Left breast biopsy 2:30 position: IDC with DCIS, grade 1, ER 100%, PR 90%, Ki-67 5%, HER-2 negative ratio 1.33; is okay  1:30 position: 0.7 cm invasive lobular cancer, grade 1, ER/PR positive HER-2 negative Ki-67 10%, T1 N0 stage IA       06/09/2016 Breast MRI    Right breast: 1.3 x 0.5 x 0.8 cm linearly oriented mass extends posteriorly to the level of pectoralis muscle, no lymph nodes, 1.8 cm left lower lobe lung nodule      07/04/2016 PET scan    1.7 cm hypermetabolic nodule medial left lower lobe suspicious for lung cancer. Moderate emphysema, tiny subcentimeter pulmonary nodules too small to characterize      07/25/2016 Surgery    Right lumpectomy: DCIS 0.2 cm, margins negative, ER 95%, PR 70%; Tis N0 stage 0 Left lumpectomy: IDC with DCIS 0.8 cm ER 100%, PR 90%, HER-2 negative, Ki-67 5% and 0.5 cm ER 95%, PR 70%, HER-2 negative, Ki-67 10%, T1b NX stage I a multifocal        CHIEF COMPLIANT: Follow-up of recent bilateral lumpectomies  INTERVAL HISTORY: Victoria Rice is a 82 year old with above-mentioned history of bilateral breast problems who underwent bilateral lumpectomies. She is here to discuss the final pathology report. The right breast was DCIS in the left breast had both invasive and DCIS. She had multifocal disease in the right side. Both sides were ER/PR positive HER-2 negative. She is  recovering very well from recent surgery. She also has a lung mass for which she is seeing Dr. Roxan Hockey tomorrow. We had discussion with Dr. Roxan Hockey last week and apparently her pulmonary function tests were not great. She does have a slight pursed lip breathing on close evaluation. She tells me that this is being held alongside been doing for the past 2 years.  REVIEW OF SYSTEMS:   Constitutional: Denies fevers, chills or abnormal weight loss Eyes: Denies blurriness of vision Ears, nose, mouth, throat, and face: Denies mucositis or sore throat Respiratory: Slight shortness of breath Cardiovascular: Denies palpitation, chest discomfort Gastrointestinal:  Denies nausea, heartburn or change in bowel habits Skin: Denies abnormal skin rashes Lymphatics: Denies new lymphadenopathy or easy bruising Neurological:Denies numbness, tingling or new weaknesses Behavioral/Psych: Mood is stable, no new changes  Extremities: No lower extremity edema Breast: Recent bilateral lumpectomies All other systems were reviewed with the patient and are negative.  I have reviewed the past medical history, past surgical history, social history and family history with the patient and they are unchanged from previous note.  ALLERGIES:  is allergic to actonel [risedronate sodium].  MEDICATIONS:  Current Outpatient Prescriptions  Medication Sig Dispense Refill  . cholecalciferol (VITAMIN D) 1000 units tablet Take 1,000 Units by mouth daily.    [name] HYDROcodone-acetaminophen (NORCO) 7.5-325 MG tablet Take 1 tablet by mouth every 6 (six) hours as needed for moderate pain. 30 tablet 0  . naproxen sodium (ANAPROX) 220 MG tablet Take  220 mg by mouth 2 (two) times daily as needed.    [name] SYNTHROID 75 MCG tablet Take 75 mcg by mouth daily before breakfast.      No current facility-administered medications for this visit.     PHYSICAL EXAMINATION: ECOG PERFORMANCE STATUS: 1 - Symptomatic but completely  ambulatory  Vitals:   07/30/16 1005  BP: (!) 150/76  Pulse: 86  Resp: 18  Temp: 98.5 F (36.9 C)   Filed Weights   07/30/16 1005  Weight: 107 lb 6.4 oz (48.7 kg)    GENERAL:alert, no distress and comfortable SKIN: skin color, texture, turgor are normal, no rashes or significant lesions EYES: normal, Conjunctiva are pink and non-injected, sclera clear OROPHARYNX:no exudate, no erythema and lips, buccal mucosa, and tongue normal  NECK: supple, thyroid normal size, non-tender, without nodularity LYMPH:  no palpable lymphadenopathy in the cervical, axillary or inguinal LUNGS: Slightly diminished breath sounds bilaterally HEART: regular rate & rhythm and no murmurs and no lower extremity edema ABDOMEN:abdomen soft, non-tender and normal bowel sounds MUSCULOSKELETAL:no cyanosis of digits and no clubbing  NEURO: alert & oriented x 3 with fluent speech, no focal motor/sensory deficits EXTREMITIES: No lower extremity edema  LABORATORY DATA:  I have reviewed the data as listed   Chemistry      Component Value Date/Time   NA 139 07/15/2016 1136   NA 139 05/28/2016 1232   K 4.4 07/15/2016 1136   K 4.6 05/28/2016 1232   CL 101 07/15/2016 1136   CO2 31 07/15/2016 1136   CO2 32 (H) 05/28/2016 1232   BUN 9 07/15/2016 1136   BUN 11.3 05/28/2016 1232   CREATININE 0.74 07/15/2016 1136   CREATININE 1.0 05/28/2016 1232      Component Value Date/Time   CALCIUM 9.4 07/15/2016 1136   CALCIUM 9.6 05/28/2016 1232   ALKPHOS 52 07/15/2016 1136   ALKPHOS 61 05/28/2016 1232   AST 21 07/15/2016 1136   AST 20 05/28/2016 1232   ALT 16 07/15/2016 1136   ALT 15 05/28/2016 1232   BILITOT 0.5 07/15/2016 1136   BILITOT 0.55 05/28/2016 1232       Lab Results  Component Value Date   WBC 6.0 07/15/2016   HGB 14.8 07/15/2016   HCT 47.3 (H) 07/15/2016   MCV 97.1 07/15/2016   PLT 268 07/15/2016   NEUTROABS 3.8 07/15/2016     ASSESSMENT & PLAN:  Breast cancer of upper-outer quadrant of  left female breast ([facility]) Right lumpectomy: DCIS 0.2 cm, margins negative, ER 95%, PR 70%; Tis N0 stage 0  Left lumpectomy: IDC with DCIS 0.8 cm ER 100%, PR 90%, HER-2 negative, Ki-67 5% and 0.5 cm ER 95%, PR 70%, HER-2 negative, Ki-67 10%, T1b NX stage I a multifocal  Treatment plan: 1. Discuss adjuvant radiation therapy followed by 2. adjuvant antiestrogen therapy With anastrozole 1 mg daily 5 years.  Anastrozole counseling: We discussed the risks and benefits of anti-estrogen therapy with aromatase inhibitors. These include but not limited to insomnia, hot flashes, mood changes, vaginal dryness, bone density loss, and weight gain. We strongly believe that the benefits far outweigh the risks. Patient understands these risks and consented to starting treatment. Planned treatment duration is 5 years.   Nodule of left lung PET scan      1.7 cm hypermetabolic nodule medial left lower lobe suspicious for lung cancer. Moderate emphysema, tiny subcentimeter pulmonary nodules too small to characterize   Patient had pulmonary function testing which were not very satisfactory according to  Dr. Roxan Hockey. He may recommend stereotactic radiosurgery instead of lobectomy. Patient has an appointment to see Dr. Roxan Hockey tomorrow. I strongly suspect that this may be a primary lung cancer. Return to clinic in 3 months for follow-up. If she does not need breast radiation, then we can call in a prescription for anastrozole 1 mg daily.  Orders Placed This Encounter  Procedures  . Ambulatory referral to Radiation Oncology    Referral Priority:   Routine    Referral Type:   Consultation    Referral Reason:   Specialty Services Required    Requested Specialty:   Radiation Oncology    Number of Visits Requested:   1   The patient has a good understanding of the overall plan. she agrees with it. she will call with any problems that may develop before the next visit here.   Rulon Eisenmenger,  MD 07/30/16

## 2016-07-31 ENCOUNTER — Encounter: Payer: Self-pay | Admitting: Thoracic Surgery (Cardiothoracic Vascular Surgery)

## 2016-07-31 ENCOUNTER — Ambulatory Visit (INDEPENDENT_AMBULATORY_CARE_PROVIDER_SITE_OTHER): Payer: Medicare Other | Admitting: Thoracic Surgery (Cardiothoracic Vascular Surgery)

## 2016-07-31 VITALS — BP 132/77 | HR 88 | Resp 16 | Ht 60.0 in | Wt 109.0 lb

## 2016-07-31 DIAGNOSIS — R911 Solitary pulmonary nodule: Secondary | ICD-10-CM

## 2016-07-31 DIAGNOSIS — J449 Chronic obstructive pulmonary disease, unspecified: Secondary | ICD-10-CM | POA: Diagnosis not present

## 2016-07-31 HISTORY — DX: Chronic obstructive pulmonary disease, unspecified: J44.9

## 2016-07-31 NOTE — Progress Notes (Signed)
[address]       [place] [address]             [phone]       HPI: Mrs. Earlywine returns to discuss management of her left lower lobe lung nodule  She is an 82 yo woman with a remote history of tobacco abuse, hypothyroidism, thyroid nodule, skin cancer and anxiety. She recently had bilateral lumpectomies by Dr. Dalbert Batman for breast cancer. I saw her on 8/9 re: a hypermetabolic left lower lobe lung cancer. The plan was to see her after the breast procedure to discuss possible lung surgery.   In the meantime, she had PFTs done. She says she didn't have any major issues with them, but it was hard to "blow out" and she thought it was harder after she was given bronchodilators.  Past Medical History:  Diagnosis Date  . Anxiety    recently- related to med. needs   . Cataract   . Hypothyroidism    nodule   . Shortness of breath dyspnea    recently increased   . Skin cancer    nose- tx. with [facility]   . Thyroid nodule   . Vaginal delivery    x2 /w  one being breech    Past Surgical History:  Procedure Laterality Date  . BREAST LUMPECTOMY  1970's   R breast  . BREAST LUMPECTOMY WITH NEEDLE LOCALIZATION Left 07/23/2016   Procedure: LEFT BREAST LUMPECTOMY WITH DOUBLE NEEDLE LOCALIZATION;  Surgeon: Fanny Skates, MD;  Location: [facility];  Service: General;  Laterality: Left;  . BREAST LUMPECTOMY WITH RADIOACTIVE SEED LOCALIZATION Right 07/23/2016   Procedure: RIGHT BREAST LUMPECTOMY WITH RADIOACTIVE SEED LOCALIZATION;  Surgeon: Fanny Skates, MD;  Location: [facility];  Service: General;  Laterality: Right;  . EYE SURGERY Right    /w IOL- cataract removed   . VARICOSE VEIN SURGERY Right    w/o stitches on R leg       Current Outpatient Prescriptions  Medication Sig Dispense Refill  . cholecalciferol (VITAMIN D) 1000 units tablet Take 1,000 Units by mouth daily.    . naproxen sodium (ANAPROX) 220 MG tablet Take 220 mg by mouth 2 (two) times daily as needed.     [name] SYNTHROID 75 MCG tablet Take 75 mcg by mouth daily before breakfast.     . HYDROcodone-acetaminophen (NORCO) 7.5-325 MG tablet Take 1 tablet by mouth every 6 (six) hours as needed for moderate pain. (Patient not taking: Reported on 07/31/2016) 30 tablet 0   No current facility-administered medications for this visit.     Physical Exam BP 132/77 (BP Location: Right Arm, Patient Position: Sitting)   Pulse 88   Resp 16   Ht 5' (1.524 m)   Wt 109 lb (49.4 kg)   SpO2 95% Comment: ON RA  BMI 21.29 kg/m  Anxious 82 year old woman in no acute distress Alert and oriented 3 with no focal deficits Mild resting tremor No cervical or supraclavicular adenopathy Cardiac regular rate and rhythm normal S1 and S2 Lungs diminished breath sounds bilaterally, faint wheezing on right  Diagnostic Tests: I again reviewed her CT and PET/CT. A 9 x 17 mm cm left lower lobe lung nodule that is hypermetabolic with an SUV of 7. There is no mediastinal or hilar adenopathy.  FVC 0.96 (50%) FEV1 0.47 (33%) FEV1 postbronchodilator 0.60 (42%) DLCO 80% of predicted  Impression: Mrs. Fielding is an 82 year old woman with a remote history of tobacco abuse  who was found to have a suspicious left lower lobe nodule. This is most likely a primary bronchogenic carcinoma.  She was to be considered for possible surgical resection. However her pulmonary function testing showed an adequate pulmonary reserve to tolerate an anatomic resection. She has severe COPD. Location of the lesion is not amenable to wedge resection. She is not a surgical candidate.  She has no point with Dr. Sondra Come next week to discuss possible radiation therapy.  I discussed possible navigational bronchoscopy and fiducial placement with her. I described the general nature of the procedure and the need for general anesthesia. She does understand this is usually done on an outpatient basis. There is no guarantee of a diagnosis. I reviewed the  indications, risks, benefits and alternatives. She understands risk include those associated with general anesthesia. She understands risk include, but are not limited to death, MI, stroke, bleeding, pneumothorax, failure to make a diagnosis, as well as possibility of other unforeseeable complications.   She was to talk to Dr. Sondra Come first before deciding whether to proceed with biopsy. Should she decide to proceed with biopsy either she or Dr. Sondra Come can just call the office and we will get it scheduled.  I spent 15 minutes with Mrs. Sowash during this visit.  Melrose Nakayama, MD [facility] [phone]

## 2016-08-04 NOTE — Progress Notes (Addendum)
Location of Breast Cancer: Breast cancer of upper-outer quadrant of left female breast   Histology per Pathology Report:   07/23/16 Diagnosis 1. Breast, lumpectomy, Right with seed - DUCTAL CARCINOMA IN SITU, 0.2 CM. - MARGINS NOT INVOLVED. - CLOSEST MARGIN LATERAL AT 0.5 CM. - PREVIOUS BIOPSY SITE. - SEE ONCOLOGY TABLE BELOW. 2. Breast, lumpectomy, Left with double wire - INVASIVE AND IN SITU DUCTAL CARCINOMA, TWO TUMORS, 0.8 AND 0.5 CM. - INVASIVE CARCINOMA FOCALLY 0.1 CM FROM POSTERIOR MARGIN. - FIBROADENOMA. - FIBROCYSTIC CHANGES.  06/19/16 Diagnosis Breast, right, needle core biopsy, LOQ mass - DUCTAL CARCINOMA IN SITU, SEE COMMENT. - FIBROADENOMA AND FIBROCYSTIC CHANGE  05/20/16 Diagnosis Breast, left, needle core biopsy, 1:00 o'clock - INVASIVE AND IN SITU MAMMARY CARCINOMA. - SEE COMMENT  Receptor Status: ER(95%), PR (70%), Her2-neu (negative), Ki-(10%)  Did patient present with symptoms (if so, please note symptoms) or was this found on screening mammography?: patient had a palpable abnormality in the left breast   Past/Anticipated interventions by surgeon, if any: 07/23/16 - Procedure: RIGHT BREAST LUMPECTOMY WITH RADIOACTIVE SEED LOCALIZATION;  Surgeon: Fanny Skates, MD and Procedure: RIGHT BREAST LUMPECTOMY WITH RADIOACTIVE SEED LOCALIZATION;  Surgeon: Fanny Skates, MD  Past/Anticipated interventions by medical oncology, if any: adjuvant radiation therapy followed by adjuvant antiestrogen therapy With anastrozole 1 mg daily 5 years.   Lymphedema issues, if any:  no  Pain issues, if any:  yes has some soreness in her left breast from her lumpectomy.  OB GYN history: patient was 15 years with first menses, she was 42 with the birth of her first child, she has 2 children, she has not used bcp or hormone replacement.  SAFETY ISSUES:  Prior radiation? no  Pacemaker/ICD? no  Possible current pregnancy?no  Is the patient on methotrexate? no  Current  Complaints / other details:  Patient also has a nodule on her left lung that was found on her breast MRI.  She has seen Dr. Roxan Hockey who said that she is not a surgical candidate.  Patient reports she does not want radiation for her breast.  She is interested in treating her lung.  She is wondering if she will needs a biopsy and what the benefit of having radiation would be.  She is concerned about her lung function long term.    BP (!) 145/92 (BP Location: Right Arm, Patient Position: Sitting)   Pulse 80   Temp 99.1 F (37.3 C) (Oral)   Ht 5' (1.524 m)   Wt 107 lb 14.4 oz (48.9 kg)   SpO2 94%   BMI 21.07 kg/m    Wt Readings from Last 3 Encounters:  08/06/16 107 lb 14.4 oz (48.9 kg)  07/31/16 109 lb (49.4 kg)  07/30/16 107 lb 6.4 oz (48.7 kg)   Kanika Bungert, Craige Cotta, RN 08/04/2016,9:59 AM

## 2016-08-06 ENCOUNTER — Ambulatory Visit
Admission: RE | Admit: 2016-08-06 | Discharge: 2016-08-06 | Disposition: A | Discharge status: Home / Self Care | Payer: Medicare Other | Source: Ambulatory Visit | Attending: Radiation Oncology | Admitting: Radiation Oncology

## 2016-08-06 ENCOUNTER — Encounter: Payer: Self-pay | Admitting: Radiation Oncology

## 2016-08-06 DIAGNOSIS — C50412 Malignant neoplasm of upper-outer quadrant of left female breast: Secondary | ICD-10-CM

## 2016-08-06 DIAGNOSIS — C50812 Malignant neoplasm of overlapping sites of left female breast: Secondary | ICD-10-CM | POA: Diagnosis present

## 2016-08-06 DIAGNOSIS — Z79899 Other long term (current) drug therapy: Secondary | ICD-10-CM | POA: Insufficient documentation

## 2016-08-06 DIAGNOSIS — Z170 Estrogen receptor positive status [ER+]: Secondary | ICD-10-CM | POA: Diagnosis not present

## 2016-08-06 DIAGNOSIS — R911 Solitary pulmonary nodule: Secondary | ICD-10-CM | POA: Diagnosis not present

## 2016-08-06 DIAGNOSIS — D0511 Intraductal carcinoma in situ of right breast: Secondary | ICD-10-CM | POA: Insufficient documentation

## 2016-08-06 DIAGNOSIS — Z888 Allergy status to other drugs, medicaments and biological substances status: Secondary | ICD-10-CM | POA: Diagnosis not present

## 2016-08-06 NOTE — Progress Notes (Signed)
Please see the Nurse Progress Note in the MD Initial Consult Encounter for this patient. 

## 2016-08-06 NOTE — Progress Notes (Signed)
Radiation Oncology         [phone]) [phone] ________________________________  Name: Victoria Rice MRN: [id number]  Date: 08/06/2016  DOB: Oct 14, 1931  Re-Evaluation Visit Note  CC: Gerrit Heck, MD  Nicholas Lose, MD    ICD-9-CM ICD-10-CM   1. Breast cancer of upper-outer quadrant of left female breast (HCC) 174.4 C50.412   2. Nodule of left lung 793.11 R91.1     Diagnosis: Stage IA (pT1b (multifocal), pNX, pMX) invasive ductal carcinoma of the left breast (ER/PR +, HER2 -), DCIS (pTis, pNX) of the right breast (ER/PR +), and presumed clinical stage I left lung cancer  Interval Since Last Radiation: N/A  Narrative:  The patient returns today for a re-evaluation. The patient's initial consultation with radiation oncology was on 05/28/16.  MRI of the bilateral breasts on 06/09/16 showed a 1.3 x 0.5 x 0.8 cm mass in the LOQ of the right breast extending posteriorly to the level of the pectoralis muscle, a 1.8 cm enhancing nodule in the left lower lobe, and a susceptibility artifact within the UOQ of the left breast from the biopsy marking clip.  Biopsy of the LOQ mass in the right breast (on 06/19/16) revealed low grade DCIS, fibroadenoma, and fibrocystic change (ER 95% positive, PR 70% positive).  CT of the chest w/ contrast on 06/23/16 showed a pulmonary nodule measuring 1.3 x 0.8 cm in the LLL, a nodule measuring 0.4 cm in the RLL, diffuse bronchial wall thickening, and mild RML bronchiectasis favored to represent sequelae chronic inflammation/infection.  PET scan on 07/04/16 revealed a 1.7 x 0.9 cm hypermetabolic pulmonary nodule in the medial LLL with SUV max of 7.0, moderate emphysema, tiny sub-centimeter pulmonary nodules too small to characterize, and a 5.4 x 6.1 cm cystic lesion in the right adnexa.  On 07/23/16, the patient had a right lumpectomy revealing DCIS (measuring 0.2 cm) with negative margins (pTis, pNX (ER/PR +)). Also on 07/23/16, she had a left lumpectomy  revealing two tumors of invasive and in situ ductal carcinoma (measuring 0.8 and 0.5 cm) with the invasive carcinoma focally 0.1 cm from the posterior margin (the 0.5 cm tumor) and 0.6 cm from the posterior margin (the 0.8 cm tumor), fibroadenoma, and fibrocystic changes (pT1b (multifocal), pNX, pMX (ER/PR +, HER2 -)).  The patient presented to Dr. Lindi Adie on 07/30/16 to discuss radiation therapy and adjuvant antiestrogen therapy with anastrozole for her bilateral breast cancers and she presented to Dr. Roxan Hockey on 07/31/16 to discuss surgery for the hypermetabolic left lower lobe pulmonary nodule. She is not a surgical candidate for a wedge resection.  The patient presents today to discuss the role of radiation for the management of her diseases. She has not yet decided if she wants to proceed with a biopsy of the hypermetabolic LLL pulmonary nodule.  ALLERGIES:  is allergic to actonel [risedronate sodium].  Meds: Current Outpatient Prescriptions  Medication Sig Dispense Refill  . cholecalciferol (VITAMIN D) 1000 units tablet Take 1,000 Units by mouth daily.    . naproxen sodium (ANAPROX) 220 MG tablet Take 220 mg by mouth 2 (two) times daily as needed.    [name] SYNTHROID 75 MCG tablet Take 75 mcg by mouth daily before breakfast.     . HYDROcodone-acetaminophen (NORCO) 7.5-325 MG tablet Take 1 tablet by mouth every 6 (six) hours as needed for moderate pain. (Patient not taking: Reported on 07/31/2016) 30 tablet 0   No current facility-administered medications for this encounter.     Physical Findings: The patient is in  no acute distress. Patient is alert and oriented.  height is 5' (1.524 m) and weight is 107 lb 14.4 oz (48.9 kg). Her oral temperature is 99.1 F (37.3 C). Her blood pressure is 145/92 (abnormal) and her pulse is 80. Her oxygen saturation is 94%.   Lungs are clear to auscultation bilaterally. Heart has regular rate and rhythm. No palpable cervical, supraclavicular, or axillary  adenopathy. Bilateral breasts healing well with surgical glue in place.  Lab Findings: Lab Results  Component Value Date   WBC 6.0 07/15/2016   HGB 14.8 07/15/2016   HCT 47.3 (H) 07/15/2016   MCV 97.1 07/15/2016   PLT 268 07/15/2016    Radiographic Findings: Mm Breast Surgical Specimen  Result Date: 07/23/2016 CLINICAL DATA:  Status post left breast lumpectomy after earlier bracketed needle localizations. EXAM: SPECIMEN RADIOGRAPH OF THE LEFT BREAST COMPARISON:  Previous exam(s). FINDINGS: Status post excision of the left breast. The 2 wire tips and 2 biopsy marker clips are present and are marked for pathology. Positions of the clips within the specimen were discussed with the OR staff during the procedure. IMPRESSION: Specimen radiograph of the left breast. Electronically Signed   By: Franki Cabot M.D.   On: 07/23/2016 14:18   Mm Breast Surgical Specimen  Result Date: 07/23/2016 CLINICAL DATA:  Status post right breast lumpectomy today after earlier radioactive seed localization. EXAM: SPECIMEN RADIOGRAPH OF THE RIGHT BREAST COMPARISON:  Previous exam(s). FINDINGS: Status post excision of the right breast. The radioactive seed and biopsy marker clip are present, completely intact, and were marked for pathology. Positions of the seed and clip within the specimen were discussed with the OR staff during the procedure. IMPRESSION: Specimen radiograph of the right breast. Electronically Signed   By: Franki Cabot M.D.   On: 07/23/2016 13:57   Mm Digital Diagnostic Unilat L  Result Date: 07/21/2016 CLINICAL DATA:  82 year female status post ultrasound-guided biopsy marker placement within a left breast mass at 230, 4 cm from the nipple. EXAM: DIAGNOSTIC LEFT MAMMOGRAM POST ULTRASOUND BIOPSY COMPARISON:  Previous exam(s). FINDINGS: Mammographic images were obtained following ultrasound guided placement of a ribbon shaped biopsy marker within the previously biopsied left breast mass at  230, 4 cm from the nipple. Post biopsy mammogram demonstrates the ribbon shaped biopsy marker to be within the left breast mass at 230. On today's views, it is noted that there is a second ribbon shaped biopsy marker within the far posterior, upper, outer left breast which was not included on the prior post biopsy images. Today's views of the left breast include more posterior tissue than was included on the 05/20/2016 mammogram. This is the ribbon shape shaped biopsy marker which was thought to have not deployed at the time of prior biopsy; however, it is seen on today's exam and is 1.8 cm inferior and medial to the biopsied mass. IMPRESSION: Appropriate position of the ribbon shaped biopsy marker placed today within the left breast mass at 230, 4 cm from the nipple. A second ribbon shaped biopsy marker is noted within the more posterior, inferior, and medial left breast, corresponding to the biopsy marker which was thought to have not deployed at the time of previous biopsy in June 2017. Findings were discussed with Dr. Dalbert Batman via telephone by Dr. Brigitte Pulse At 2:40 p.m. on 07/21/2016. The patient will return for wire localization of the 2 biopsy-proven left breast carcinomas on 07/23/2016 as denoted by the coil shaped biopsy marker at 1 o'clock and more anterior ribbon  shaped biopsy marker at 230. Final Assessment: Post Procedure Mammograms for Marker Placement Electronically Signed   By: Pamelia Hoit M.D.   On: 07/21/2016 15:59   US Breast Ltd Uni Left Inc Axilla  Result Date: 07/21/2016 CLINICAL DATA:  82 year old female who returns for clip placement within the left breast mass at 230, 4 cm from the nipple. Repeat ultrasound is performed to reidentify the previously seen mass at 230, 4 cm from the nipple. EXAM: ULTRASOUND OF THE LEFT BREAST COMPARISON:  Previous exam(s). FINDINGS: Targeted ultrasound again demonstrates an irregular hypoechoic mass at 230, 4 cm from the nipple measuring 3 x 7 x 5 mm. IMPRESSION:  Known biopsy proven malignancy within the left breast at 230, 4 cm from the nipple. RECOMMENDATION: Ultrasound-guided biopsy marker placement within the left breast mass at 230, 4 cm from the nipple. I have discussed the findings and recommendations with the patient. Results were also provided in writing at the conclusion of the visit. If applicable, a reminder letter will be sent to the patient regarding the next appointment. BI-RADS CATEGORY  6: Known biopsy-proven malignancy - appropriate action should be taken. Electronically Signed   By: Pamelia Hoit M.D.   On: 07/21/2016 15:54   Mm Rt Radioactive Seed Loc Mammo Guide  Result Date: 07/21/2016 CLINICAL DATA:  82 year old female for seed localization of MRI guided right breast biopsy site demonstrating ductal carcinoma in situ. EXAM: MAMMOGRAPHIC GUIDED RADIOACTIVE SEED LOCALIZATION OF THE RIGHT BREAST COMPARISON:  Previous exam(s). FINDINGS: Patient presents for radioactive seed localization prior to right lumpectomy. Prior to the procedure, CC and full lateral views of the right breast were performed demonstrating the dumbbell-shaped biopsy marker to be in the expected location of the biopsy site in the lower, outer right breast. I met with the patient and we discussed the procedure of seed localization including benefits and alternatives. We discussed the high likelihood of a successful procedure. We discussed the risks of the procedure including infection, bleeding, tissue injury and further surgery. We discussed the low dose of radioactivity involved in the procedure. Informed, written consent was given. The usual time-out protocol was performed immediately prior to the procedure. Using mammographic guidance, sterile technique, 1% lidocaine and an I-125 radioactive seed, the dumbbell-shaped biopsy marker was localized using a lateral to medial approach. The follow-up mammogram images confirm the seed in the expected location and were marked for Dr. Dalbert Batman.  Follow-up survey of the patient confirms presence of the radioactive seed. Order number of I-125 seed:  [id number]. Total activity:  0.253 mCi  Reference Date: July 04, 2016 The patient tolerated the procedure well and was released from the [facility]. She was given instructions regarding seed removal. IMPRESSION: Radioactive seed localization of the right breast. No apparent complications. Electronically Signed   By: Pamelia Hoit M.D.   On: 07/21/2016 15:09   Mm Lt Plc Breast Loc Dev   1st Lesion  Inc Mammo Guide  Result Date: 07/23/2016 CLINICAL DATA:  Biopsy-proven invasive carcinomas at 2 sites in the outer left breast. Patient scheduled for breast conservation surgery today requiring bracketed wire localizations. EXAM: NEEDLE LOCALIZATION OF THE LEFT BREAST WITH MAMMO GUIDANCE COMPARISON:  Previous exams. FINDINGS: Patient presents for needle localization prior to breast conservation surgery. I met with the patient and we discussed the procedure of needle localization including benefits and alternatives. We discussed the high likelihood of a successful procedure. We discussed the risks of the procedure, including infection, bleeding, tissue injury, and further surgery. Informed,  written consent was given. The usual time-out protocol was performed immediately prior to the procedure. Using mammographic guidance, sterile technique, 1% lidocaine and a 5 cm modified Kopans needle, the more anterior of the 2 ribbon shaped biopsy clips within the outer left breast was localized using a lateral approach. Next, the coil shaped clip within the outer left breast was localized using a lateral approach. The images were marked for Dr. Dalbert Batman. IMPRESSION: Needle localizations left breast. No apparent complications. Electronically Signed   By: Franki Cabot M.D.   On: 07/23/2016 10:42   Mm Lt Plc Breast Loc Dev   Ea Add Lesion  Inc Mammo Guide  Result Date: 07/23/2016 CLINICAL DATA:  Biopsy-proven invasive carcinomas  at 2 sites in the outer left breast. Patient scheduled for breast conservation surgery today requiring bracketed wire localizations. EXAM: NEEDLE LOCALIZATION OF THE LEFT BREAST WITH MAMMO GUIDANCE COMPARISON:  Previous exams. FINDINGS: Patient presents for needle localization prior to breast conservation surgery. I met with the patient and we discussed the procedure of needle localization including benefits and alternatives. We discussed the high likelihood of a successful procedure. We discussed the risks of the procedure, including infection, bleeding, tissue injury, and further surgery. Informed, written consent was given. The usual time-out protocol was performed immediately prior to the procedure. Using mammographic guidance, sterile technique, 1% lidocaine and a 5 cm modified Kopans needle, the more anterior of the 2 ribbon shaped biopsy clips within the outer left breast was localized using a lateral approach. Next, the coil shaped clip within the outer left breast was localized using a lateral approach. The images were marked for Dr. Dalbert Batman. IMPRESSION: Needle localizations left breast. No apparent complications. Electronically Signed   By: Franki Cabot M.D.   On: 07/23/2016 10:42   [place] Lt Plc Breast Loc Dev   1st Lesion  Inc US Guide  Result Date: 07/21/2016 CLINICAL DATA:  82 year old female for ultrasound-guided biopsy marker placement within the left breast mass at 230, 4 cm from the nipple. EXAM: BIOPSY MARKER PLACEMENT WITHIN THE LEFT BREAST WITH ULTRASOUND GUIDANCE COMPARISON:  Previous exams. FINDINGS: Patient presents for biopsy marker placement within the left breast mass at 230, 4 cm from the nipple prior to preoperative wire localization on Wednesday, July 23, 2016. I met with the patient and we discussed the procedure of biopsy marker placement including benefits and alternatives. We discussed the high likelihood of a successful procedure. We discussed the risks of the procedure,  including infection, bleeding, tissue injury, and further surgery. Informed, written consent was given. The usual time-out protocol was performed immediately prior to the procedure. Using ultrasound guidance, sterile technique, and 1% lidocaine, a ribbon shaped biopsy marker was placed within the left breast mass at 230, 4 cm from the nipple. IMPRESSION: Biopsy marker placement within a left breast mass at 230, 4 cm from the nipple. No apparent complications. Electronically Signed   By: Pamelia Hoit M.D.   On: 07/21/2016 15:54    Impression: Stage IA (pT1b (multifocal), pNX, pMX) invasive ductal carcinoma of the left breast (ER/PR +, HER2 -), DCIS (pTis, pNX) of the right breast (ER/PR +), and presumed clinical stage I left lung cancer  The patient would be a candidate for bilateral breast conservation with radiation, but given the low risk of recurrence, she would not like to proceed with radiation as part of her breast cancer regiment. Instead, she would like to proceed with hormonal therapy.  I recommended the patient proceed with a bronchoscopy  and fiducial placement by Dr. Roxan Hockey for the nodule in the LLL. Based on the location, she would probably not be a candidate for SBRT given the close proximity to the heart, but may be a candidate for hypofractionated treatment.  Plan: The patient will proceed with a biopsy under Dr. Roxan Hockey and then the patient will be seen by radiation oncology in a post-operative setting to discuss radiation therapy for presumed clinical stage I lung cancer. ____________________________________ -----------------------------------  Blair Promise, PhD, MD  This document serves as a record of services personally performed by Gery Pray, MD. It was created on his behalf by Darcus Austin, a trained medical scribe. The creation of this record is based on the scribe's personal observations and the provider's statements to them. This document has been checked and  approved by the attending provider.

## 2016-08-13 ENCOUNTER — Other Ambulatory Visit: Payer: Self-pay | Admitting: *Deleted

## 2016-08-13 ENCOUNTER — Telehealth: Payer: Self-pay | Admitting: Oncology

## 2016-08-13 DIAGNOSIS — C50412 Malignant neoplasm of upper-outer quadrant of left female breast: Secondary | ICD-10-CM

## 2016-08-13 MED ORDER — ANASTROZOLE 1 MG PO TABS: 1.0000 mg | ORAL_TABLET | Freq: Every day | ORAL | 6 refills | Status: DC

## 2016-08-13 NOTE — Telephone Encounter (Signed)
Victoria Rice left a message asking if Dr. Sondra Come has decided what her treatment plan will be yet.  She is requesting a return call at [phone].

## 2016-08-13 NOTE — Telephone Encounter (Signed)
Spoke with patient to inform her that we have called in her anastrozole to her pharmacy.  She states she would like to wait and start in a few days until she knows what they are going to do about her lung nodule.

## 2016-08-14 ENCOUNTER — Other Ambulatory Visit: Payer: Self-pay | Admitting: Radiation Oncology

## 2016-08-14 DIAGNOSIS — R911 Solitary pulmonary nodule: Secondary | ICD-10-CM

## 2016-08-19 ENCOUNTER — Ambulatory Visit (INDEPENDENT_AMBULATORY_CARE_PROVIDER_SITE_OTHER): Payer: Medicare Other | Admitting: Thoracic Surgery (Cardiothoracic Vascular Surgery)

## 2016-08-19 ENCOUNTER — Other Ambulatory Visit: Payer: Self-pay | Admitting: *Deleted

## 2016-08-19 VITALS — BP 156/86 | HR 77 | Resp 16 | Ht 60.0 in | Wt 109.0 lb

## 2016-08-19 DIAGNOSIS — R911 Solitary pulmonary nodule: Secondary | ICD-10-CM | POA: Diagnosis not present

## 2016-08-19 DIAGNOSIS — J449 Chronic obstructive pulmonary disease, unspecified: Secondary | ICD-10-CM

## 2016-08-19 NOTE — Progress Notes (Signed)
[address]       [place] [address]             [phone]       HPI: Victoria Rice returns today to discuss bronchoscopic biopsy of her left lower lobe nodule.  She is an 82 year old woman with a right history of tobacco abuse, hypothyroidism, thyroid nodule, skin cancer, anxiety, and a recently diagnosed breast cancer. During her evaluation for breast cancer she was found to have a left lower lobe nodule on an MR. CT of the chest showed a 1.3 x 0.8 cm left lower lobe nodule. A PET CT showed the nodule was hypermetabolic with an SUV of 7.  She had bilateral lumpectomies by Dr. Dalbert Batman on 07/23/2016. She did not have any issues after that surgery.  I did PFTs on her which showed markedly reduced FEV1 of 0.47 (33% predicted). I do not feel she was a candidate for surgical resection. She had concerns about radiation. She saw Dr. Sondra Come last week. After talking to him she would be in favor of radiation, probably hypo-fractionated, for the lung nodule.  Past Medical History:  Diagnosis Date  . Cataract   . Hypothyroidism    nodule   . Shortness of breath dyspnea    recently increased   . Skin cancer    nose- tx. with [facility]   . Thyroid nodule   . Vaginal delivery    x2 /w  one being breech   Past Surgical History:  Procedure Laterality Date  . BREAST LUMPECTOMY  1970's   R breast  . BREAST LUMPECTOMY WITH NEEDLE LOCALIZATION Left 07/23/2016   Procedure: LEFT BREAST LUMPECTOMY WITH DOUBLE NEEDLE LOCALIZATION;  Surgeon: Fanny Skates, MD;  Location: [facility];  Service: General;  Laterality: Left;  . BREAST LUMPECTOMY WITH RADIOACTIVE SEED LOCALIZATION Right 07/23/2016   Procedure: RIGHT BREAST LUMPECTOMY WITH RADIOACTIVE SEED LOCALIZATION;  Surgeon: Fanny Skates, MD;  Location: [facility];  Service: General;  Laterality: Right;  . EYE SURGERY Right    /w IOL- cataract removed   . VARICOSE VEIN SURGERY Right    w/o stitches on R leg      Current  Outpatient Prescriptions  Medication Sig Dispense Refill  . anastrozole (ARIMIDEX) 1 MG tablet Take 1 tablet (1 mg total) by mouth daily. 30 tablet 6  . cholecalciferol (VITAMIN D) 1000 units tablet Take 1,000 Units by mouth daily.    . naproxen sodium (ANAPROX) 220 MG tablet Take 220 mg by mouth 2 (two) times daily as needed.    [name] SYNTHROID 75 MCG tablet Take 75 mcg by mouth daily before breakfast.     . HYDROcodone-acetaminophen (NORCO) 7.5-325 MG tablet Take 1 tablet by mouth every 6 (six) hours as needed for moderate pain. (Patient not taking: Reported on 07/31/2016) 30 tablet 0   No current facility-administered medications for this visit.     Physical Exam BP (!) 156/86   Pulse 77   Resp 16   Ht 5' (1.524 m)   Wt 109 lb (49.4 kg)   SpO2 94% Comment: ON RA  BMI 21.29 kg/m  Elderly woman in no acute distress Alert and oriented 3 with no focal neurologic deficits No cervical or supraclavicular adenopathy Lungs clear with equal breath bilaterally Cardiac regular rate and rhythm normal S1 and S2 Abdomen soft No peripheral edema  Diagnostic Tests: I again reviewed the CT and PET CT. They show a 1.3 x 0.8 cm nodule this  markedly hypermetabolic.  Impression:  82 year old woman with recently diagnosed bilateral breast cancer (invasive on the left and DCIS on the right) who has a hypermetabolic left lower lobe lung nodule that is highly suspicious for a non-small cell carcinoma.  Her pulmonary function testing is not adequate to tolerate a resection. However, she is a candidate for radiation. She would need a biopsy and fiducial marker placement prior to radiation.  I discussed navigational bronchoscopy and fiducial placement with her. I described the general nature of the procedure and the need for general anesthesia. We will plan to do this on an outpatient basis. There is no guarantee we will be able to make a diagnosis. I reviewed the indications, risks, benefits and  alternatives. She understands the risks include those associated with general anesthesia. She understands the risks include, but are not limited to death, MI, stroke, bleeding, pneumothorax, failure to make a diagnosis, as well as possibility of other unforeseeable complications.   She accepts the risks and wishes to proceed. She would like to do the procedure next Monday, 08/25/2016  Plan: Navigational bronchoscopy for biopsy and fiducial placement on Monday, 08/25/2016  Melrose Nakayama, MD [facility] [phone]

## 2016-08-19 NOTE — Pre-Procedure Instructions (Signed)
Victoria Rice  08/19/2016      Victoria Rice [address] [place] [address] [place] [address] Phone: [phone] Fax: [phone]    Your procedure is scheduled on Monday, August 25, 2016  Report to [facility] Admitting at 5:30 A.M.  Call this number if you have problems the morning of surgery:  [phone]   Remember:  Do not eat food or drink liquids after midnight 'Sunday, August 24, 2016  Take these medicines the morning of surgery with A SIP OF WATER : Anastrozole (ARIMIDEX), SYNTHROID Stop taking Aspirin,vitamins, fish oil and herbal medications. Do not take any NSAIDs ie: Ibuprofen, Advil, Naproxen   (Anaprox), BC and Goody Powder or any medication containing Aspirin; stop now.  Do not wear jewelry, make-up or nail polish.  Do not wear lotions, powders, or perfumes, or deoderant.  Do not shave 48 hours prior to surgery.   Do not bring valuables to the hospital.  [facility] is not responsible for any belongings or valuables.  Contacts, dentures or bridgework may not be worn into surgery.  Leave your suitcase in the car.  After surgery it may be brought to your room.  For patients admitted to the hospital, discharge time will be determined by your treatment team.  Patients discharged the day of surgery will not be allowed to drive home.   Name and phone number of your driver:   Special instructions: [facility] - Preparing for Surgery  Before surgery, you can play an important role.  Because skin is not sterile, your skin needs to be as free of germs as possible.  You can reduce the number of germs on you skin by washing with CHG (chlorahexidine gluconate) soap before surgery.  CHG is an antiseptic cleaner which kills germs and bonds with the skin to continue killing germs even after washing.  Please DO NOT use if you have an allergy to CHG or antibacterial soaps.  If your skin becomes reddened/irritated  stop using the CHG and inform your nurse when you arrive at Short Stay.  Do not shave (including legs and underarms) for at least 48 hours prior to the first CHG shower.  You may shave your face.  Please follow these instructions carefully:   1.  Shower with CHG Soap the night before surgery and the morning of Surgery.  2.  If you choose to wash your hair, wash your hair first as usual with your normal shampoo.  3.  After you shampoo, rinse your hair and body thoroughly to remove the Shampoo.  4.  Use CHG as you would any other liquid soap.  You can apply chg directly  to the skin and wash gently with scrungie or a clean washcloth.  5.  Apply the CHG Soap to your body ONLY FROM THE NECK DOWN.  Do not use on open wounds or open sores.  Avoid contact with your eyes, ears, mouth and genitals (private parts).  Wash genitals (private parts) with your normal soap.  6.  Wash thoroughly, paying special attention to the area where your surgery will be performed.  7.  Thoroughly rinse your body with warm water from the neck down.  8.  DO NOT shower/wash with your normal soap after using and rinsing off the CHG Soap.  9.  Pat yourself dry with a clean towel.            10'$ .  Wear clean  pajamas.            11.  Place clean sheets on your bed the night of your first shower and do not sleep with pets.  Day of Surgery  Do not apply any lotions/deodorants the morning of surgery.  Please wear clean clothes to the hospital/surgery center.  Please read over the following fact sheets that you were given. Pain Booklet, Coughing and Deep Breathing, MRSA Information and Surgical Site Infection Prevention

## 2016-08-20 ENCOUNTER — Encounter (HOSPITAL_COMMUNITY)
Admission: RE | Admit: 2016-08-20 | Discharge: 2016-08-20 | Disposition: A | Discharge status: Home / Self Care | Payer: Medicare Other | Source: Ambulatory Visit | Attending: Thoracic Surgery (Cardiothoracic Vascular Surgery) | Admitting: Thoracic Surgery (Cardiothoracic Vascular Surgery)

## 2016-08-20 ENCOUNTER — Encounter (HOSPITAL_COMMUNITY): Payer: Self-pay

## 2016-08-20 DIAGNOSIS — R911 Solitary pulmonary nodule: Secondary | ICD-10-CM | POA: Diagnosis not present

## 2016-08-20 HISTORY — DX: Solitary pulmonary nodule: R91.1

## 2016-08-20 HISTORY — DX: Pneumonia, unspecified organism: J18.9

## 2016-08-20 HISTORY — DX: Family history of other specified conditions: Z84.89

## 2016-08-20 LAB — COMPREHENSIVE METABOLIC PANEL
ALBUMIN: 4.1 g/dL (ref 3.5–5.0)
ALT: 17 U/L (ref 14–54)
ANION GAP: 8 (ref 5–15)
AST: 22 U/L (ref 15–41)
Alkaline Phosphatase: 62 U/L (ref 38–126)
BUN: 6 mg/dL (ref 6–20)
CO2: 32 mmol/L (ref 22–32)
Calcium: 9.8 mg/dL (ref 8.9–10.3)
Chloride: 98 mmol/L — ABNORMAL LOW (ref 101–111)
Creatinine, Ser: 0.81 mg/dL (ref 0.44–1.00)
GFR calc Af Amer: >60 mL/min (ref >60)
GFR calc non Af Amer: >60 mL/min (ref >60)
GLUCOSE: 93 mg/dL (ref 65–99)
POTASSIUM: 4.7 mmol/L (ref 3.5–5.1)
SODIUM: 138 mmol/L (ref 135–145)
TOTAL PROTEIN: 6.8 g/dL (ref 6.5–8.1)
Total Bilirubin: 0.6 mg/dL (ref 0.3–1.2)

## 2016-08-20 LAB — CBC
HCT: 47.4 % — ABNORMAL HIGH (ref 36.0–46.0)
Hemoglobin: 14.9 g/dL (ref 12.0–15.0)
MCH: 30.2 pg (ref 26.0–34.0)
MCHC: 31.4 g/dL (ref 30.0–36.0)
MCV: 96.1 fL (ref 78.0–100.0)
PLATELETS: 280 10*3/uL (ref 150–400)
RBC: 4.93 MIL/uL (ref 3.87–5.11)
RDW: 12.8 % (ref 11.5–15.5)
WBC: 5.9 10*3/uL (ref 4.0–10.5)

## 2016-08-20 LAB — SURGICAL PCR SCREEN
MRSA, PCR: NEGATIVE
Staphylococcus aureus: NEGATIVE

## 2016-08-20 LAB — PROTIME-INR
INR: 0.98
Prothrombin Time: 13 seconds (ref 11.4–15.2)

## 2016-08-20 LAB — APTT: APTT: 34 s (ref 24–36)

## 2016-08-20 NOTE — Progress Notes (Signed)
Pt denies having a stress test, echo and cardiac cath.

## 2016-08-20 NOTE — Progress Notes (Signed)
Pt denies any acute cardiopulmonary issues. Pt denies being under the care of a cardiologist. Pt denies having recent labs.

## 2016-08-25 ENCOUNTER — Ambulatory Visit (HOSPITAL_COMMUNITY): Payer: Medicare Other | Admitting: Anesthesiology

## 2016-08-25 ENCOUNTER — Encounter (HOSPITAL_COMMUNITY)
Admission: RE | Disposition: A | Discharge status: Home / Self Care | Payer: Self-pay | Source: Ambulatory Visit | Attending: Thoracic Surgery (Cardiothoracic Vascular Surgery)

## 2016-08-25 ENCOUNTER — Encounter (HOSPITAL_COMMUNITY): Payer: Self-pay | Admitting: Anesthesiology

## 2016-08-25 ENCOUNTER — Ambulatory Visit (HOSPITAL_COMMUNITY): Payer: Medicare Other

## 2016-08-25 ENCOUNTER — Ambulatory Visit (HOSPITAL_COMMUNITY)
Admission: RE | Admit: 2016-08-25 | Discharge: 2016-08-25 | Disposition: A | Discharge status: Home / Self Care | Payer: Medicare Other | Source: Ambulatory Visit | Attending: Thoracic Surgery (Cardiothoracic Vascular Surgery) | Admitting: Thoracic Surgery (Cardiothoracic Vascular Surgery)

## 2016-08-25 DIAGNOSIS — J449 Chronic obstructive pulmonary disease, unspecified: Secondary | ICD-10-CM | POA: Insufficient documentation

## 2016-08-25 DIAGNOSIS — E039 Hypothyroidism, unspecified: Secondary | ICD-10-CM | POA: Insufficient documentation

## 2016-08-25 DIAGNOSIS — Z87891 Personal history of nicotine dependence: Secondary | ICD-10-CM | POA: Insufficient documentation

## 2016-08-25 DIAGNOSIS — D3A090 Benign carcinoid tumor of the bronchus and lung: Secondary | ICD-10-CM | POA: Diagnosis not present

## 2016-08-25 DIAGNOSIS — Z419 Encounter for procedure for purposes other than remedying health state, unspecified: Secondary | ICD-10-CM

## 2016-08-25 DIAGNOSIS — R911 Solitary pulmonary nodule: Secondary | ICD-10-CM | POA: Diagnosis not present

## 2016-08-25 HISTORY — PX: VIDEO BRONCHOSCOPY WITH ENDOBRONCHIAL NAVIGATION: SHX6175

## 2016-08-25 HISTORY — PX: FUDUCIAL PLACEMENT: SHX5083

## 2016-08-25 SURGERY — VIDEO BRONCHOSCOPY WITH ENDOBRONCHIAL NAVIGATION
Anesthesia: General

## 2016-08-25 MED ORDER — GLYCOPYRROLATE 0.2 MG/ML IJ SOLN
INTRAMUSCULAR | Status: DC | PRN
  Administered 2016-08-25: 0.4 mg via INTRAVENOUS

## 2016-08-25 MED ORDER — ROCURONIUM BROMIDE 100 MG/10ML IV SOLN
INTRAVENOUS | Status: DC | PRN
  Administered 2016-08-25: 40 mg via INTRAVENOUS

## 2016-08-25 MED ORDER — LIDOCAINE HCL (CARDIAC) 20 MG/ML IV SOLN
INTRAVENOUS | Status: DC | PRN
  Administered 2016-08-25: 40 mg via INTRAVENOUS

## 2016-08-25 MED ORDER — PROPOFOL 10 MG/ML IV BOLUS
INTRAVENOUS | Status: AC
  Filled 2016-08-25: qty 20

## 2016-08-25 MED ORDER — SUGAMMADEX SODIUM 200 MG/2ML IV SOLN
INTRAVENOUS | Status: DC | PRN
  Administered 2016-08-25: 150 mg via INTRAVENOUS

## 2016-08-25 MED ORDER — FENTANYL CITRATE (PF) 100 MCG/2ML IJ SOLN: 25.0000 ug | INTRAMUSCULAR | Status: DC | PRN

## 2016-08-25 MED ORDER — FENTANYL CITRATE (PF) 100 MCG/2ML IJ SOLN
INTRAMUSCULAR | Status: DC | PRN
  Administered 2016-08-25 (×2): 50 ug via INTRAVENOUS

## 2016-08-25 MED ORDER — PROPOFOL 10 MG/ML IV BOLUS
INTRAVENOUS | Status: DC | PRN
  Administered 2016-08-25: 100 mg via INTRAVENOUS
  Administered 2016-08-25: 30 mg via INTRAVENOUS

## 2016-08-25 MED ORDER — FENTANYL CITRATE (PF) 100 MCG/2ML IJ SOLN
INTRAMUSCULAR | Status: AC
  Filled 2016-08-25: qty 2

## 2016-08-25 MED ORDER — ONDANSETRON HCL 4 MG/2ML IJ SOLN
INTRAMUSCULAR | Status: DC | PRN
  Administered 2016-08-25: 4 mg via INTRAVENOUS

## 2016-08-25 MED ORDER — 0.9 % SODIUM CHLORIDE (POUR BTL) OPTIME
TOPICAL | Status: DC | PRN
Start: 2016-08-25 — End: 2016-08-25
  Administered 2016-08-25: 1000 mL

## 2016-08-25 MED ORDER — LACTATED RINGERS IV SOLN
INTRAVENOUS | Status: DC | PRN
  Administered 2016-08-25: 07:00:00 via INTRAVENOUS

## 2016-08-25 MED ORDER — PHENYLEPHRINE HCL 10 MG/ML IJ SOLN
INTRAVENOUS | Status: DC | PRN
  Administered 2016-08-25: 10 ug/min via INTRAVENOUS

## 2016-08-25 MED ORDER — LIDOCAINE HCL 4 % MT SOLN
OROMUCOSAL | Status: DC | PRN
  Administered 2016-08-25: 4 mL via TOPICAL

## 2016-08-25 MED ORDER — DEXAMETHASONE SODIUM PHOSPHATE 10 MG/ML IJ SOLN
INTRAMUSCULAR | Status: DC | PRN
  Administered 2016-08-25: 5 mg via INTRAVENOUS

## 2016-08-25 SURGICAL SUPPLY — 40 items
ADAPTER BRONCH F/PENTAX (ADAPTER) ×3 IMPLANT
ADPR BSCP EDG PNTX (ADAPTER) ×1
BRUSH SUPERTRAX BIOPSY (INSTRUMENTS) ×2 IMPLANT
BRUSH SUPERTRAX NDL-TIP CYTO (INSTRUMENTS) ×2 IMPLANT
CANISTER SUCTION 2500CC (MISCELLANEOUS) ×3 IMPLANT
CHANNEL WORK EXTEND EDGE 180 (KITS) IMPLANT
CHANNEL WORK EXTEND EDGE 45 (KITS) IMPLANT
CHANNEL WORK EXTEND EDGE 90 (KITS) ×2 IMPLANT
CONT SPEC 4OZ CLIKSEAL STRL BL (MISCELLANEOUS) ×6 IMPLANT
COVER TABLE BACK 60X90 (DRAPES) ×3 IMPLANT
FILTER STRAW FLUID ASPIR (MISCELLANEOUS) IMPLANT
FORCEPS BIOP SUPERTRX PREMAR (INSTRUMENTS) ×2 IMPLANT
GAUZE SPONGE 4X4 12PLY STRL (GAUZE/BANDAGES/DRESSINGS) ×3 IMPLANT
GLOVE SURG SIGNA 7.5 PF LTX (GLOVE) ×3 IMPLANT
GOWN STRL REUS W/ TWL XL LVL3 (GOWN DISPOSABLE) ×1 IMPLANT
GOWN STRL REUS W/TWL XL LVL3 (GOWN DISPOSABLE) ×3
KIT CLEAN ENDO COMPLIANCE (KITS) ×3 IMPLANT
KIT MARKER FIDUCIAL DELIVERY (KITS) ×2 IMPLANT
KIT PROCEDURE EDGE 180 (KITS) IMPLANT
KIT PROCEDURE EDGE 45 (KITS) IMPLANT
KIT PROCEDURE EDGE 90 (KITS) ×2 IMPLANT
KIT ROOM TURNOVER OR (KITS) ×3 IMPLANT
MARKER FIDUCIAL SL NIT COIL (Implant Marker) ×6 IMPLANT
MARKER SKIN DUAL TIP RULER LAB (MISCELLANEOUS) ×3 IMPLANT
NDL SUPERTRX PREMARK BIOPSY (NEEDLE) IMPLANT
NEEDLE SUPERTRX PREMARK BIOPSY (NEEDLE) ×3 IMPLANT
NS IRRIG 1000ML POUR BTL (IV SOLUTION) ×3 IMPLANT
OIL SILICONE PENTAX (PARTS (SERVICE/REPAIRS)) ×3 IMPLANT
PAD ARMBOARD 7.5X6 YLW CONV (MISCELLANEOUS) ×6 IMPLANT
PATCHES PATIENT (LABEL) ×9 IMPLANT
SYR 20CC LL (SYRINGE) ×3 IMPLANT
SYR 20ML ECCENTRIC (SYRINGE) ×3 IMPLANT
SYR 30ML LL (SYRINGE) ×3 IMPLANT
SYR 5ML LL (SYRINGE) ×3 IMPLANT
TOWEL OR 17X24 6PK STRL BLUE (TOWEL DISPOSABLE) ×3 IMPLANT
TRAP SPECIMEN MUCOUS 40CC (MISCELLANEOUS) ×3 IMPLANT
TUBE CONNECTING 20'X1/4 (TUBING) ×1
TUBE CONNECTING 20X1/4 (TUBING) ×3 IMPLANT
UNDERPAD 30X30 (UNDERPADS AND DIAPERS) ×3 IMPLANT
WATER STERILE IRR 1000ML POUR (IV SOLUTION) ×3 IMPLANT

## 2016-08-25 NOTE — H&P (View-Only) (Signed)
[address]       [place] [address]             [phone]       HPI: Victoria Rice returns today to discuss bronchoscopic biopsy of her left lower lobe nodule.  She is an 82 year old woman with a right history of tobacco abuse, hypothyroidism, thyroid nodule, skin cancer, anxiety, and a recently diagnosed breast cancer. During her evaluation for breast cancer she was found to have a left lower lobe nodule on an MR. CT of the chest showed a 1.3 x 0.8 cm left lower lobe nodule. A PET CT showed the nodule was hypermetabolic with an SUV of 7.  She had bilateral lumpectomies by Dr. Dalbert Batman on 07/23/2016. She did not have any issues after that surgery.  I did PFTs on her which showed markedly reduced FEV1 of 0.47 (33% predicted). I do not feel she was a candidate for surgical resection. She had concerns about radiation. She saw Dr. Sondra Come last week. After talking to him she would be in favor of radiation, probably hypo-fractionated, for the lung nodule.  Past Medical History:  Diagnosis Date  . Cataract   . Hypothyroidism    nodule   . Shortness of breath dyspnea    recently increased   . Skin cancer    nose- tx. with [facility]   . Thyroid nodule   . Vaginal delivery    x2 /w  one being breech   Past Surgical History:  Procedure Laterality Date  . BREAST LUMPECTOMY  1970's   R breast  . BREAST LUMPECTOMY WITH NEEDLE LOCALIZATION Left 07/23/2016   Procedure: LEFT BREAST LUMPECTOMY WITH DOUBLE NEEDLE LOCALIZATION;  Surgeon: Fanny Skates, MD;  Location: [facility];  Service: General;  Laterality: Left;  . BREAST LUMPECTOMY WITH RADIOACTIVE SEED LOCALIZATION Right 07/23/2016   Procedure: RIGHT BREAST LUMPECTOMY WITH RADIOACTIVE SEED LOCALIZATION;  Surgeon: Fanny Skates, MD;  Location: [facility];  Service: General;  Laterality: Right;  . EYE SURGERY Right    /w IOL- cataract removed   . VARICOSE VEIN SURGERY Right    w/o stitches on R leg      Current  Outpatient Prescriptions  Medication Sig Dispense Refill  . anastrozole (ARIMIDEX) 1 MG tablet Take 1 tablet (1 mg total) by mouth daily. 30 tablet 6  . cholecalciferol (VITAMIN D) 1000 units tablet Take 1,000 Units by mouth daily.    . naproxen sodium (ANAPROX) 220 MG tablet Take 220 mg by mouth 2 (two) times daily as needed.    [name] SYNTHROID 75 MCG tablet Take 75 mcg by mouth daily before breakfast.     . HYDROcodone-acetaminophen (NORCO) 7.5-325 MG tablet Take 1 tablet by mouth every 6 (six) hours as needed for moderate pain. (Patient not taking: Reported on 07/31/2016) 30 tablet 0   No current facility-administered medications for this visit.     Physical Exam BP (!) 156/86   Pulse 77   Resp 16   Ht 5' (1.524 m)   Wt 109 lb (49.4 kg)   SpO2 94% Comment: ON RA  BMI 21.29 kg/m  Elderly woman in no acute distress Alert and oriented 3 with no focal neurologic deficits No cervical or supraclavicular adenopathy Lungs clear with equal breath bilaterally Cardiac regular rate and rhythm normal S1 and S2 Abdomen soft No peripheral edema  Diagnostic Tests: I again reviewed the CT and PET CT. They show a 1.3 x 0.8 cm nodule this  markedly hypermetabolic.  Impression:  82 year old woman with recently diagnosed bilateral breast cancer (invasive on the left and DCIS on the right) who has a hypermetabolic left lower lobe lung nodule that is highly suspicious for a non-small cell carcinoma.  Her pulmonary function testing is not adequate to tolerate a resection. However, she is a candidate for radiation. She would need a biopsy and fiducial marker placement prior to radiation.  I discussed navigational bronchoscopy and fiducial placement with her. I described the general nature of the procedure and the need for general anesthesia. We will plan to do this on an outpatient basis. There is no guarantee we will be able to make a diagnosis. I reviewed the indications, risks, benefits and  alternatives. She understands the risks include those associated with general anesthesia. She understands the risks include, but are not limited to death, MI, stroke, bleeding, pneumothorax, failure to make a diagnosis, as well as possibility of other unforeseeable complications.   She accepts the risks and wishes to proceed. She would like to do the procedure next Monday, 08/25/2016  Plan: Navigational bronchoscopy for biopsy and fiducial placement on Monday, 08/25/2016  Melrose Nakayama, MD [facility] [phone]

## 2016-08-25 NOTE — Anesthesia Preprocedure Evaluation (Signed)
Anesthesia Evaluation  Patient identified by MRN, date of birth, ID band Patient awake    Reviewed: Allergy & Precautions, NPO status , Patient's Chart, lab work & pertinent test results  Airway Mallampati: II  TM Distance: >3 FB     Dental  (+) Teeth Intact,    Pulmonary shortness of breath and with exertion, COPD, former smoker,    breath sounds clear to auscultation       Cardiovascular negative cardio ROS   Rhythm:Regular     Neuro/Psych negative neurological ROS  negative psych ROS   GI/Hepatic negative GI ROS, Neg liver ROS,   Endo/Other  Hypothyroidism   Renal/GU negative Renal ROS     Musculoskeletal   Abdominal   Peds  Hematology negative hematology ROS (+)   Anesthesia Other Findings   Reproductive/Obstetrics                             Anesthesia Physical Anesthesia Plan  ASA: III  Anesthesia Plan: General   Post-op Pain Management:    Induction: Intravenous  Airway Management Planned: Oral ETT  Additional Equipment: None  Intra-op Plan:   Post-operative Plan: Extubation in OR  Informed Consent: I have reviewed the patients History and Physical, chart, labs and discussed the procedure including the risks, benefits and alternatives for the proposed anesthesia with the patient or authorized representative who has indicated his/her understanding and acceptance.   Dental advisory given  Plan Discussed with: CRNA and Surgeon  Anesthesia Plan Comments:         Anesthesia Quick Evaluation

## 2016-08-25 NOTE — Anesthesia Postprocedure Evaluation (Signed)
Anesthesia Post Note  Patient: Victoria Rice  Procedure(s) Performed: Procedure(s) (LRB): VIDEO BRONCHOSCOPY WITH ENDOBRONCHIAL NAVIGATION (N/A) PLACEMENT OF FUDUCIAL (N/A)  Patient location during evaluation: PACU Anesthesia Type: General Level of consciousness: awake Pain management: pain level controlled Vital Signs Assessment: post-procedure vital signs reviewed and stable Respiratory status: spontaneous breathing Cardiovascular status: stable Postop Assessment: no signs of nausea or vomiting Anesthetic complications: no    Last Vitals:  Vitals:   08/25/16 1110 08/25/16 1120  BP: (!) 108/56 111/72  Pulse: 82   Resp: 17 16  Temp:  36.1 C    Last Pain:  Vitals:   08/25/16 0614  TempSrc: Oral                 [name]

## 2016-08-25 NOTE — Anesthesia Procedure Notes (Signed)
Procedure Name: Intubation Date/Time: 08/25/2016 8:07 AM Performed by: Jacquiline Doe A Pre-anesthesia Checklist: Patient identified, Emergency Drugs available, Suction available and Patient being monitored Patient Re-evaluated:Patient Re-evaluated prior to inductionOxygen Delivery Method: Circle System Utilized and Circle system utilized Preoxygenation: Pre-oxygenation with 100% oxygen Intubation Type: IV induction and Cricoid Pressure applied Ventilation: Mask ventilation without difficulty Laryngoscope Size: Mac and 3 Grade View: Grade II Tube type: Oral Tube size: 8.5 mm Number of attempts: 1 Airway Equipment and Method: Stylet,  Oral airway and LTA kit utilized Placement Confirmation: ETT inserted through vocal cords under direct vision,  positive ETCO2 and breath sounds checked- equal and bilateral Tube secured with: Tape Dental Injury: Teeth and Oropharynx as per pre-operative assessment

## 2016-08-25 NOTE — Discharge Instructions (Addendum)
Do not drive or engage in heavy physical activity for 24 hours  You may resume normal activities tomorrow  You may cough up small amounts of blood over the next few days  You may use over the counter cough suppressants and/or pain relievers such as acetaminophen (Tylenol) as needed  Follow up with Dr. Sondra Come as scheduled  Call [phone] if you develop chest pain, shortness of breath, or cough up more than 2 tablespoons of blood

## 2016-08-25 NOTE — Transfer of Care (Signed)
Immediate Anesthesia Transfer of Care Note  Patient: Victoria Rice  Procedure(s) Performed: Procedure(s): VIDEO BRONCHOSCOPY WITH ENDOBRONCHIAL NAVIGATION (N/A) PLACEMENT OF FUDUCIAL (N/A)  Patient Location: PACU  Anesthesia Type:General  Level of Consciousness: awake, oriented, sedated, patient cooperative and responds to stimulation  Airway & Oxygen Therapy: Patient Spontanous Breathing and Patient connected to nasal cannula oxygen  Post-op Assessment: Report given to RN, Post -op Vital signs reviewed and stable, Patient moving all extremities and Patient moving all extremities X 4  Post vital signs: Reviewed and stable  Last Vitals:  Vitals:   08/25/16 0614 08/25/16 0615  BP:  (!) 190/74  Pulse: 73   Resp: 18   Temp: 36.6 C     Last Pain:  Vitals:   08/25/16 0614  TempSrc: Oral         Complications: No apparent anesthesia complications

## 2016-08-25 NOTE — Interval H&P Note (Signed)
History and Physical Interval Note:  08/25/2016 7:11 AM  Victoria Rice  has presented today for surgery, with the diagnosis of LLL NODULE  The various methods of treatment have been discussed with the patient and family. After consideration of risks, benefits and other options for treatment, the patient has consented to  Procedure(s): [place] (N/A) PLACEMENT OF FUDUCIAL (N/A) as a surgical intervention .  The patient's history has been reviewed, patient examined, no change in status, stable for surgery.  I have reviewed the patient's chart and labs.  Questions were answered to the patient's satisfaction.     Melrose Nakayama

## 2016-08-25 NOTE — Brief Op Note (Signed)
08/25/2016  1:10 PM  PATIENT:  Victoria Rice  82 y.o. female  PRE-OPERATIVE DIAGNOSIS:  Left Lower Lobe NODULE  POST-OPERATIVE DIAGNOSIS:  Left Lower Lobe NODULE  PROCEDURE:  Procedure(s): VIDEO BRONCHOSCOPY WITH ENDOBRONCHIAL NAVIGATION (N/A) PLACEMENT OF FUDUCIAL (N/A) x 3  SURGEON:  Surgeon(s) and Role:    * Melrose Nakayama, MD - Primary  ANESTHESIA:   general  EBL:  Total I/O In: 700 [I.V.:700] Out: 3 [Blood:3]  BLOOD ADMINISTERED:none  DRAINS: none   LOCAL MEDICATIONS USED:  NONE  SPECIMEN:  Source of Specimen:  Left lower lobe nodule  DISPOSITION OF SPECIMEN:  PATHOLOGY  PLAN OF CARE: Discharge to home after PACU  PATIENT DISPOSITION:  PACU - hemodynamically stable.   Delay start of Pharmacological VTE agent (>24hrs) due to surgical blood loss or risk of bleeding: not applicable

## 2016-08-26 ENCOUNTER — Encounter (HOSPITAL_COMMUNITY): Payer: Self-pay | Admitting: Thoracic Surgery (Cardiothoracic Vascular Surgery)

## 2016-08-26 NOTE — Op Note (Signed)
[name], Victoria Rice             ACCOUNT NO.:  [id number]  MEDICAL RECORD NO.:  [id number]  LOCATION:  [facility]                         FACILITY:  [facility]  PHYSICIAN:  Revonda Standard. Roxan Hockey, M.D.DATE OF BIRTH:  10/11/1931  DATE OF PROCEDURE:  08/25/2016 DATE OF DISCHARGE:  08/25/2016                              OPERATIVE REPORT   PREOPERATIVE DIAGNOSIS:  Left lower lobe nodule.  POSTOPERATIVE DIAGNOSIS:  Left lower lobe nodule.  PROCEDURE:  Electromagnetic navigational bronchoscopy with brushings, biopsies and placement of three fiducial markers.  SURGEON:  Revonda Standard. Roxan Hockey, M.D.  ANESTHESIA:  General.  FINDINGS:  Navigated within 8 mm of the nodule with good alignment, initial needle aspirations and brushings negative for tumor.  Placement of fiducials with good proximity to the suggested sites.  CLINICAL NOTE:  [name] is an 82 year old woman, recently diagnosed with breast cancer.  During her workup, she was found to have a left lower lobe nodule, that was 1.3 x 0.8 cm.  On PET-CT, the nodule was hypermetabolic with an SUV of 7.  Her pulmonary function testing showed an FEV1 of 0.47, 33% of predicted and she was not felt to be a candidate for surgical resection.  She was seen in consultation by Dr. Sondra Come, who felt that she would be a radiation candidate.  The patient was advised to undergo navigational bronchoscopy for biopsies and fiducial placement.  The indications, risks, benefits and alternatives were discussed in detail with the patient.  She understood and accepted the risks and agreed to proceed.  OPERATIVE NOTE:  [name] was brought to the operating room on August 25, 2016.  She had induction of general anesthesia and was intubated.  Flexible fiberoptic bronchoscopy was performed via the endotracheal tube.  It revealed normal endobronchial anatomy and no endobronchial lesions to the level of the subsegmental bronchi.  The locatable guide for  navigation then was advanced.  Registration was performed.  There was good correlation between the video and virtual bronchoscopy.  The bronchoscope then was advanced to the orifice of the left lower lobe bronchus.  The locatable guide and sheath were advanced into the subsegmental bronchus that had been selected on the preoperative planning and the locatable guide was advanced within 8 mm of the nodule with good alignment.  Sampling then was performed initially using a standard brush.  Because of the close proximity to the heart, needle aspirations were not performed.  The brushings were sent for quick prep.  While awaiting those results, multiple biopsies were obtained, a total of 12 biopsies were taken in all.  Periodically during the sampling, the locatable guide was reinserted to ensure continued good alignment and good proximity.  Fluoroscopy was used for all sampling.  Three fiducial markers then were placed in sites as indicated by the [place].  The quick prep returned showing some atypical cells, but was not adequate for formal diagnosis.  We will await the results of the permanent pathology.  The patient was extubated in the operating room and taken to the postanesthetic care unit in good condition.     Revonda Standard Roxan Hockey, M.D.     SCH/MEDQ  D:  08/25/2016  T:  08/26/2016  Job:  [id number]  

## 2016-08-29 ENCOUNTER — Ambulatory Visit (INDEPENDENT_AMBULATORY_CARE_PROVIDER_SITE_OTHER): Payer: Medicare Other | Admitting: Thoracic Surgery (Cardiothoracic Vascular Surgery)

## 2016-08-29 ENCOUNTER — Encounter: Payer: Self-pay | Admitting: Thoracic Surgery (Cardiothoracic Vascular Surgery)

## 2016-08-29 VITALS — BP 124/72 | HR 83 | Resp 20 | Ht 60.0 in | Wt 107.0 lb

## 2016-08-29 DIAGNOSIS — Z9889 Other specified postprocedural states: Secondary | ICD-10-CM | POA: Diagnosis not present

## 2016-08-29 DIAGNOSIS — R911 Solitary pulmonary nodule: Secondary | ICD-10-CM | POA: Diagnosis not present

## 2016-08-29 DIAGNOSIS — J449 Chronic obstructive pulmonary disease, unspecified: Secondary | ICD-10-CM

## 2016-08-29 NOTE — Progress Notes (Signed)
Patient ID: Victoria Rice, female   DOB: 08-07-1931, 82 y.o.   MRN: [id number]      [address]       [place] [address]             [phone]      Victoria Rice returns today to discuss pathology results from her recent navigational bronchoscopy.  She is an 82 year old woman recently diagnosed with breast cancer. During her evaluation she was found to have a left lower lobe nodule. CT of the chest showed a 1.3 x 0.8 cm left lower lobe nodule. A PET CT showed the nodule was hypermetabolic with an SUV of 7.  She had bilateral breast 1 pack knees by Dr. Dalbert Batman on 07/23/2016. She did well with surgery.  I did a navigational bronchoscopy on 08/25/2016. She tolerated the procedure well. She did have some sore throat and has coughed up by small amounts of blood-tinged mucus. But overall she feels well.  Pathology Diagnosis Lung, biopsy, Left lower lobe - LOW GRADE NEUROENDOCRINE TUMOR (CARCINOID), SEE COMMENT. Microscopic Comment There are areas of bland neuroendocrine appearing cells with focal areas having a spindle appearance. Immunohistochemistry reveals the cells are positive for TTF-1, CD56, synaptophysin, and chromogranin.  I discussed the pathology results with Victoria Rice. Given her advanced age and poor pulmonary function and her recent diagnosis of breast cancer, I would favor following this carcinoid tumor for now. It was a low-grade typical carcinoid tumor. In all likelihood it will not require treatment during her lifetime. It is very unlikely to spread to other areas. She does not have any symptoms of carcinoid syndrome.   She is being followed by Dr. Sondra Come and Dr. Lindi Adie for her breast cancer and we will see what their thoughts are on the subject as well.  I would like to see her back in 6 months with a CT of the chest.  I spent 10 minutes with Victoria Rice during this visit. The entire time was spent in review of pathology and counseling.

## 2016-10-26 DIAGNOSIS — C7A8 Other malignant neuroendocrine tumors: Secondary | ICD-10-CM | POA: Insufficient documentation

## 2016-10-26 HISTORY — DX: Other malignant neuroendocrine tumors: C7A.8

## 2016-10-26 NOTE — Assessment & Plan Note (Signed)
Given the low grade nature of her disease and her advanced age, we elected to observe it and treat only if she develops carcinoid symptoms. 

## 2016-10-26 NOTE — Assessment & Plan Note (Signed)
Right lumpectomy: DCIS 0.2 cm, margins negative, ER 95%, PR 70%; Tis N0 stage 0  Left lumpectomy: IDC with DCIS 0.8 cm ER 100%, PR 90%, HER-2 negative, Ki-67 5% and 0.5 cm ER 95%, PR 70%, HER-2 negative, Ki-67 10%, T1b NX stage I a multifocal  Treatment plan: Adj Anti estrogen therapy with Anastrozole  

## 2016-10-28 ENCOUNTER — Encounter: Payer: Self-pay | Admitting: Hematology and Oncology

## 2016-10-28 ENCOUNTER — Ambulatory Visit (HOSPITAL_BASED_OUTPATIENT_CLINIC_OR_DEPARTMENT_OTHER): Payer: Medicare Other | Admitting: Hematology and Oncology

## 2016-10-28 DIAGNOSIS — C7A8 Other malignant neuroendocrine tumors: Secondary | ICD-10-CM

## 2016-10-28 DIAGNOSIS — Z170 Estrogen receptor positive status [ER+]: Secondary | ICD-10-CM

## 2016-10-28 DIAGNOSIS — R0609 Other forms of dyspnea: Secondary | ICD-10-CM | POA: Diagnosis not present

## 2016-10-28 DIAGNOSIS — C50412 Malignant neoplasm of upper-outer quadrant of left female breast: Secondary | ICD-10-CM | POA: Diagnosis not present

## 2016-10-28 NOTE — Progress Notes (Signed)
Patient Care Team: Leighton Ruff, MD as PCP - General (Family Medicine) Nicholas Lose, MD as Consulting Physician (Hematology and Oncology) Fanny Skates, MD as Consulting Physician (General Surgery) Gery Pray, MD as Consulting Physician (Radiation Oncology)  DIAGNOSIS:  Encounter Diagnoses  Name Primary?  . Neuroendocrine carcinoma of lung ([place])   . Malignant neoplasm of upper-outer quadrant of left breast in female, estrogen receptor positive ([place])     SUMMARY OF ONCOLOGIC HISTORY:   Breast cancer of upper-outer quadrant of left female breast ([facility])   05/20/2016 Initial Diagnosis    Left breast biopsy 2:30 position: IDC with DCIS, grade 1, ER 100%, PR 90%, Ki-67 5%, HER-2 negative ratio 1.33; is okay  1:30 position: 0.7 cm invasive lobular cancer, grade 1, ER/PR positive HER-2 negative Ki-67 10%, T1 N0 stage IA       06/09/2016 Breast MRI    Right breast: 1.3 x 0.5 x 0.8 cm linearly oriented mass extends posteriorly to the level of pectoralis muscle, no lymph nodes, 1.8 cm left lower lobe lung nodule      07/04/2016 PET scan    1.7 cm hypermetabolic nodule medial left lower lobe suspicious for lung cancer. Moderate emphysema, tiny subcentimeter pulmonary nodules too small to characterize      07/25/2016 Surgery    Right lumpectomy: DCIS 0.2 cm, margins negative, ER 95%, PR 70%; Tis N0 stage 0 Left lumpectomy: IDC with DCIS 0.8 cm ER 100%, PR 90%, HER-2 negative, Ki-67 5% and 0.5 cm ER 95%, PR 70%, HER-2 negative, Ki-67 10%, T1b NX stage I a multifocal        Neuroendocrine carcinoma of lung ([place])   08/25/2016 Initial Diagnosis    Low Grade Neuroendocrine carcinoma of lung ([facility])       CHIEF COMPLIANT: Follow-up on anastrozole therapy.  INTERVAL HISTORY: Victoria Rice is a 82 year old with above-mentioned history of right breast cancer treated with lumpectomy in August and is currently on antiestrogen therapy with anastrozole. She had lung nodules which were  evaluated and biopsy-proven to be low-grade neuroendocrine carcinoma the lung. She is being followed by thoracic surgery for those. The plan is surveillance observation with a recheck in 6 months with a CT scan. Patient today complains of mild shortness of breath exertion and expected wheezes.  REVIEW OF SYSTEMS:   Constitutional: Denies fevers, chills or abnormal weight loss Eyes: Denies blurriness of vision Ears, nose, mouth, throat, and face: Denies mucositis or sore throat Respiratory: Expiratory wheeze Cardiovascular: Denies palpitation, chest discomfort Gastrointestinal:  Denies nausea, heartburn or change in bowel habits Skin: Denies abnormal skin rashes Lymphatics: Denies new lymphadenopathy or easy bruising Neurological:Denies numbness, tingling or new weaknesses Behavioral/Psych: Mood is stable, no new changes  Extremities: No lower extremity edema Breast:  denies any pain or lumps or nodules in either breasts All other systems were reviewed with the patient and are negative.  I have reviewed the past medical history, past surgical history, social history and family history with the patient and they are unchanged from previous note.  ALLERGIES:  is allergic to actonel [risedronate sodium].  MEDICATIONS:  Current Outpatient Prescriptions  Medication Sig Dispense Refill  . anastrozole (ARIMIDEX) 1 MG tablet Take 1 tablet (1 mg total) by mouth daily. (Patient taking differently: Take 1 mg by mouth daily at 12 noon. ) 30 tablet 6  . cholecalciferol (VITAMIN D) 1000 units tablet Take 1,000 Units by mouth daily.    . naproxen sodium (ANAPROX) 220 MG tablet Take 220 mg by  mouth 2 (two) times daily as needed (for pain.).     [name] Polyethyl Glycol-Propyl Glycol (SYSTANE) 0.4-0.3 % SOLN Apply 1 drop to eye 3 (three) times daily as needed (for dry eyes).    . SYNTHROID 75 MCG tablet Take 75 mcg by mouth daily before breakfast.      No current facility-administered medications for this  visit.     PHYSICAL EXAMINATION: ECOG PERFORMANCE STATUS: 1 - Symptomatic but completely ambulatory  Vitals:   10/28/16 1009  BP: (!) 161/97  Pulse: 76  Resp: 18  Temp: 97.5 F (36.4 C)   Filed Weights   10/28/16 1009  Weight: 108 lb 12.8 oz (49.4 kg)    GENERAL:alert, no distress and comfortable SKIN: skin color, texture, turgor are normal, no rashes or significant lesions EYES: normal, Conjunctiva are pink and non-injected, sclera clear OROPHARYNX:no exudate, no erythema and lips, buccal mucosa, and tongue normal  NECK: supple, thyroid normal size, non-tender, without nodularity LYMPH:  no palpable lymphadenopathy in the cervical, axillary or inguinal LUNGS: Expiratory wheezes bilaterally HEART: regular rate & rhythm and no murmurs and no lower extremity edema ABDOMEN:abdomen soft, non-tender and normal bowel sounds MUSCULOSKELETAL:no cyanosis of digits and no clubbing  NEURO: alert & oriented x 3 with fluent speech, no focal motor/sensory deficits EXTREMITIES: No lower extremity edema  LABORATORY DATA:  I have reviewed the data as listed   Chemistry      Component Value Date/Time   NA 138 08/20/2016 1154   NA 139 05/28/2016 1232   K 4.7 08/20/2016 1154   K 4.6 05/28/2016 1232   CL 98 (L) 08/20/2016 1154   CO2 32 08/20/2016 1154   CO2 32 (H) 05/28/2016 1232   BUN 6 08/20/2016 1154   BUN 11.3 05/28/2016 1232   CREATININE 0.81 08/20/2016 1154   CREATININE 1.0 05/28/2016 1232      Component Value Date/Time   CALCIUM 9.8 08/20/2016 1154   CALCIUM 9.6 05/28/2016 1232   ALKPHOS 62 08/20/2016 1154   ALKPHOS 61 05/28/2016 1232   AST 22 08/20/2016 1154   AST 20 05/28/2016 1232   ALT 17 08/20/2016 1154   ALT 15 05/28/2016 1232   BILITOT 0.6 08/20/2016 1154   BILITOT 0.55 05/28/2016 1232       Lab Results  Component Value Date   WBC 5.9 08/20/2016   HGB 14.9 08/20/2016   HCT 47.4 (H) 08/20/2016   MCV 96.1 08/20/2016   PLT 280 08/20/2016   NEUTROABS 3.8  07/15/2016   ASSESSMENT & PLAN:  Neuroendocrine carcinoma of lung ([facility]) Given the low grade nature of her disease and her advanced age, we elected to observe it and treat only if she develops carcinoid symptoms.  Breast cancer of upper-outer quadrant of left female breast ([place]) Right lumpectomy: DCIS 0.2 cm, margins negative, ER 95%, PR 70%; Tis N0 stage 0  Left lumpectomy: IDC with DCIS 0.8 cm ER 100%, PR 90%, HER-2 negative, Ki-67 5% and 0.5 cm ER 95%, PR 70%, HER-2 negative, Ki-67 10%, T1b NX stage I a multifocal  Treatment plan: Adj Anti estrogen therapy with Anastrozole started September 2017 Anastrozole toxicities: 1. Occasional hot flashes Denies any myalgias or arthralgias.  Return to clinic in 1 year for follow-up. Patient follows with surgery in 6 months  No orders of the defined types were placed in this encounter.  The patient has a good understanding of the overall plan. she agrees with it. she will call with any problems that may develop before  the next visit here.   Rulon Eisenmenger, MD 10/28/16

## 2016-12-23 ENCOUNTER — Ambulatory Visit (INDEPENDENT_AMBULATORY_CARE_PROVIDER_SITE_OTHER): Payer: Self-pay | Admitting: Thoracic Surgery (Cardiothoracic Vascular Surgery)

## 2016-12-23 ENCOUNTER — Encounter: Payer: Self-pay | Admitting: Thoracic Surgery (Cardiothoracic Vascular Surgery)

## 2016-12-23 VITALS — BP 180/88 | HR 90 | Resp 20 | Ht 60.0 in | Wt 108.0 lb

## 2016-12-23 DIAGNOSIS — R05 Cough: Secondary | ICD-10-CM

## 2016-12-23 DIAGNOSIS — R059 Cough, unspecified: Secondary | ICD-10-CM

## 2016-12-23 DIAGNOSIS — R911 Solitary pulmonary nodule: Secondary | ICD-10-CM

## 2016-12-23 DIAGNOSIS — Z9889 Other specified postprocedural states: Secondary | ICD-10-CM

## 2016-12-23 DIAGNOSIS — J449 Chronic obstructive pulmonary disease, unspecified: Secondary | ICD-10-CM

## 2016-12-23 NOTE — Progress Notes (Signed)
      [address]       [place] [address]             [phone]    HPI: Mrs. Heady returns today after coughing up one of her fiducial markers  She is an 83 yo woman who had a bronch with biopsy and fiducial placement for left lower lobe nodule in September. Nodule turned out to be a low-grade carcinoid tumor. Given her age and COPD he was elected to follow this rather than to treat her operatively.  Over the weekend she had a severe cough and wheezing. She noticed that she coughed up one of the fiducial markers it was placed the time of her bronchoscopy. She is very concerned about that. She says that her breathing has improved since she coughed up the marker and she is no longer having wheezing. Past Medical History:  Diagnosis Date  . Cataract   . Family history of adverse reaction to anesthesia    " my sisiter was unable to move for a while with the spinal anesthesia."  . Hypothyroidism    nodule   . Nodule of left lung    lower lobe  . Pneumonia    as an infant  . Shortness of breath dyspnea    recently increased   . Skin cancer    nose- tx. with [facility]   . Thyroid nodule   . Vaginal delivery    x2 /w  one being breech     Current Outpatient Prescriptions  Medication Sig Dispense Refill  . anastrozole (ARIMIDEX) 1 MG tablet Take 1 tablet (1 mg total) by mouth daily. (Patient taking differently: Take 1 mg by mouth daily at 12 noon. ) 30 tablet 6  . cholecalciferol (VITAMIN D) 1000 units tablet Take 1,000 Units by mouth daily.    . naproxen sodium (ANAPROX) 220 MG tablet Take 220 mg by mouth 2 (two) times daily as needed (for pain.).     [name] SYNTHROID 75 MCG tablet Take 75 mcg by mouth daily before breakfast.      No current facility-administered medications for this visit.     Physical Exam BP (!) 180/88   Pulse 90   Resp 20   Ht 5' (1.524 m)   Wt 108 lb (49 kg)   SpO2 97% Comment: RA  BMI 21.75 kg/m  83 year old woman in no acute  distress Lungs diminished but clear bilaterally  Diagnostic Tests: none  Impression: Mrs. Ronda is an 83 year old woman with a carcinoid tumor in the left lower lobe. She had fiducial markers placed at the time of bronchoscopy back in September. She turned out to have a low-grade carcinoid tumor. We elected to follow this rather than subject her to an operation that would be relatively high risk.  I reassured her that there is no danger from coughing up the fiducial marker.  Plan: Return for a scheduled follow-up with CT in 2 months.  Melrose Nakayama, MD [facility] [phone]

## 2017-01-02 ENCOUNTER — Telehealth: Payer: Self-pay | Admitting: Adult Health

## 2017-01-02 NOTE — Telephone Encounter (Signed)
The Survivorship Care Plan was mailed to Ms. Baik as she reported not being able to come in to the Survivorship Clinic for an in-person visit at this time. A letter was mailed to her outlining the purpose of the content of the care plan, as well as encouraging her to reach out to me with any questions or concerns.  .  Additional resources regarding healthy eating, recommendations for exercise, [employer] program, and brochures for support services were also included in the mailed packet.  A shorter version of the care plan was also routed/faxed/mailed to Gerrit Heck, MD, the patient's PCP.  I will not be placing any follow-up appointments to the Survivorship Clinic for Ms. Wesolowski, but I am happy to see her at any time in the future for any survivorship concerns that may arise. Thank you for allowing me to participate in her care!  Charlestine Massed, NP [facility] [phone]

## 2017-01-28 ENCOUNTER — Other Ambulatory Visit: Payer: Self-pay | Admitting: *Deleted

## 2017-01-28 DIAGNOSIS — R911 Solitary pulmonary nodule: Secondary | ICD-10-CM

## 2017-02-24 ENCOUNTER — Ambulatory Visit
Admission: RE | Admit: 2017-02-24 | Discharge: 2017-02-24 | Disposition: A | Discharge status: Home / Self Care | Payer: Medicare Other | Source: Ambulatory Visit | Attending: Thoracic Surgery (Cardiothoracic Vascular Surgery) | Admitting: Thoracic Surgery (Cardiothoracic Vascular Surgery)

## 2017-02-24 ENCOUNTER — Ambulatory Visit (INDEPENDENT_AMBULATORY_CARE_PROVIDER_SITE_OTHER): Payer: Medicare Other | Admitting: Thoracic Surgery (Cardiothoracic Vascular Surgery)

## 2017-02-24 ENCOUNTER — Encounter: Payer: Self-pay | Admitting: Thoracic Surgery (Cardiothoracic Vascular Surgery)

## 2017-02-24 VITALS — BP 154/80 | HR 69 | Resp 20 | Ht 60.0 in | Wt 108.0 lb

## 2017-02-24 DIAGNOSIS — R911 Solitary pulmonary nodule: Secondary | ICD-10-CM

## 2017-02-24 DIAGNOSIS — D3A00 Benign carcinoid tumor of unspecified site: Secondary | ICD-10-CM

## 2017-02-24 NOTE — Progress Notes (Signed)
[address]       [place] [address]             [phone]     HPI: Mrs Struthers returns for follow-up of a left lower lobe carcinoid tumor.  She is an 83 year old woman who was diagnosed with breast cancer in 2017. As part of her evaluation she had a CT of the chest which showed a left lower lobe lung nodule. This was hypermetabolic on PET/CT. I did navigational bronchoscopy with biopsy and fiducial placement. It showed a low-grade carcinoid tumor. She has severe COPD with FEV1 of 0.4 on her PFTs. It did improve to 0.6 with bronchodilators. She was a poor surgical candidate. Given that this was a low grade carcinoid tumor we elected to follow it.  I last saw her in the office in January. She came in complaining that she had coughed up one of the fiducial markers. Since then she's been doing well and has had not had any major respiratory issues. She says that some days well and is active and other days she gets short of breath with even minimal activities.  Past Medical History:  Diagnosis Date  . Cataract   . Family history of adverse reaction to anesthesia    " my sisiter was unable to move for a while with the spinal anesthesia."  . Hypothyroidism    nodule   . Nodule of left lung    lower lobe  . Pneumonia    as an infant  . Shortness of breath dyspnea    recently increased   . Skin cancer    nose- tx. with [facility]   . Thyroid nodule   . Vaginal delivery    x2 /w  one being breech     Current Outpatient Prescriptions  Medication Sig Dispense Refill  . anastrozole (ARIMIDEX) 1 MG tablet Take 1 tablet (1 mg total) by mouth daily. (Patient taking differently: Take 1 mg by mouth daily at 12 noon. ) 30 tablet 6  . cholecalciferol (VITAMIN D) 1000 units tablet Take 1,000 Units by mouth daily.    . naproxen sodium (ANAPROX) 220 MG tablet Take 220 mg by mouth 2 (two) times daily as needed (for pain.).     [name] SYNTHROID 75 MCG tablet Take 50 mcg by mouth  daily before breakfast.      No current facility-administered medications for this visit.     Physical Exam BP (!) 154/80   Pulse 69   Resp 20   Ht 5' (1.524 m)   Wt 108 lb (49 kg)   SpO2 95% Comment: RA  BMI 21.09 kg/m  Elderly frail-appearing woman in no acute distress Alert and oriented 3 with no focal deficits No cervical or supraclavicular adenopathy Lungs with diminished breath bilaterally, no wheezing Cardiac regular rate and rhythm normal S1  Diagnostic Tests: CT CHEST WITHOUT CONTRAST  TECHNIQUE: Multidetector CT imaging of the chest was performed following the standard protocol without IV contrast.  COMPARISON:  07/04/2016 PET-CT and 06/23/2016 chest CT.  FINDINGS: Cardiovascular: Normal heart size. No significant pericardial fluid/thickening. Left anterior descending, left circumflex and right coronary atherosclerosis. Atherosclerotic nonaneurysmal thoracic aorta. Re- demonstration of a nonaneurysmal aberrant right subclavian artery arising from the distal aortic arch with retroesophageal course. Normal caliber pulmonary arteries.  Mediastinum/Nodes: Stable dominant partially calcified 1.9 cm lower right thyroid lobe nodule. Unremarkable esophagus. No pathologically enlarged axillary, mediastinal or gross hilar lymph nodes, noting limited sensitivity for  the detection of hilar adenopathy on this noncontrast study. Surgical clips are noted in lateral left breast.  Lungs/Pleura: No pneumothorax. No pleural effusion. Anterior left lower lobe solid 1.7 x 1.0 cm pulmonary nodule (series 4/ image 79), previously 1.7 x 0.9 cm on 06/23/2016 chest CT using similar measurement technique, not appreciably changed. There is mild-to-moderate cylindrical and varicoid bronchiectasis throughout both lungs involving all lung lobes, not appreciably changed. There is scattered mild centrilobular nodularity throughout both lungs with centrilobular nodules measuring up to  4 mm in the right lower lobe (series 4/image 68), stable. Small irregular bandlike opacities at the areas of bronchiectasis in the right middle lobe and lingula are stable and compatible with postinfectious scarring. No acute consolidative airspace disease, lung masses or new significant pulmonary nodules. There is a stable mosaic attenuation throughout both lungs.  Upper abdomen: Tiny hiatal hernia. Cholelithiasis.  Musculoskeletal: No aggressive appearing focal osseous lesions. Mild thoracic spondylosis.  IMPRESSION: 1. Interval stability of 1.7 cm left lower lobe pulmonary carcinoid. No evidence of metastatic disease in the chest. 2. No appreciable change in diffuse mild-to-moderate cylindrical and varicoid bronchiectasis, scattered mild centrilobular nodularity and mild postinfectious scarring in the right middle lobe and lingula. These findings may be the sequela of chronic atypical mycobacterial infection (MAI). 3. Stable mosaic attenuation throughout both lungs, suggesting air trapping due to small airways disease . 4. Aortic atherosclerosis. Three-vessel coronary atherosclerosis. Thyroid right subclavian artery . 5. Stable 1.9 cm partially calcified lower right thyroid lobe nodule . 6. Cholelithiasis . 7. Tiny hiatal hernia.   Electronically Signed   By: Ilona Sorrel M.D.   On: 02/24/2017 13:39 I personally reviewed the CT chest and concur with the findings as above, I do think the tumor may have grown very slightly in the interim.  Impression: Mrs. Lesko is an 83 year old woman with breast cancer who was found to have a left lower lobe lung nodule. This turned out to be a low-grade carcinoid tumor. This was discussed our multidisciplinary conference and she was not felt to be likely benefit from radiation. She is a poor surgical candidate with an FEV1 of 0.6 with bronchodilators. Given her advanced age, comorbidities, and low-grade nature of the tumor the decision  was made to follow her radiographically.  Her scan today shows no significant change in the interval since her PET/CT. May have grown slightly since her initial CT but it has been minimal.  We did discuss the possibility of attempting a wedge resection and the risk that would entail. She prefers to continue with radiographic follow-up for now.  Plan: Return in 6 months with CT chest  Melrose Nakayama, MD [facility] [phone]

## 2017-03-12 ENCOUNTER — Other Ambulatory Visit: Payer: Self-pay | Admitting: Emergency Medicine

## 2017-03-12 MED ORDER — ANASTROZOLE 1 MG PO TABS: 1.0000 mg | ORAL_TABLET | Freq: Every day | ORAL | 6 refills | Status: DC

## 2017-07-16 ENCOUNTER — Other Ambulatory Visit: Payer: Self-pay | Admitting: Family Medicine

## 2017-07-16 DIAGNOSIS — Z9889 Other specified postprocedural states: Secondary | ICD-10-CM

## 2017-07-22 ENCOUNTER — Ambulatory Visit
Admission: RE | Admit: 2017-07-22 | Discharge: 2017-07-22 | Disposition: A | Discharge status: Home / Self Care | Payer: Medicare Other | Source: Ambulatory Visit | Attending: Family Medicine | Admitting: Family Medicine

## 2017-07-22 DIAGNOSIS — Z9889 Other specified postprocedural states: Secondary | ICD-10-CM

## 2017-07-31 ENCOUNTER — Other Ambulatory Visit: Payer: Self-pay | Admitting: Thoracic Surgery (Cardiothoracic Vascular Surgery)

## 2017-07-31 DIAGNOSIS — D3A00 Benign carcinoid tumor of unspecified site: Secondary | ICD-10-CM

## 2017-08-25 ENCOUNTER — Ambulatory Visit (INDEPENDENT_AMBULATORY_CARE_PROVIDER_SITE_OTHER): Payer: Medicare Other | Admitting: Thoracic Surgery (Cardiothoracic Vascular Surgery)

## 2017-08-25 ENCOUNTER — Ambulatory Visit
Admission: RE | Admit: 2017-08-25 | Discharge: 2017-08-25 | Disposition: A | Discharge status: Home / Self Care | Payer: Medicare Other | Source: Ambulatory Visit | Attending: Thoracic Surgery (Cardiothoracic Vascular Surgery) | Admitting: Thoracic Surgery (Cardiothoracic Vascular Surgery)

## 2017-08-25 ENCOUNTER — Encounter: Payer: Self-pay | Admitting: Thoracic Surgery (Cardiothoracic Vascular Surgery)

## 2017-08-25 VITALS — BP 150/79 | HR 77 | Resp 16 | Ht 60.0 in | Wt 110.0 lb

## 2017-08-25 DIAGNOSIS — D3A00 Benign carcinoid tumor of unspecified site: Secondary | ICD-10-CM

## 2017-08-25 NOTE — Progress Notes (Signed)
[address]       [place] [address]             [phone]     HPI: Victoria Rice returns for a scheduled follow-up visit regarding a carcinoid tumor in her left lower lobe.  Mrs. Facemire is an 83 year old woman who is being followed for a carcinoid tumor in the left lower lobe. She was diagnosed with breast cancer in 2017. A CT of the chest showed a left lower lobe lung nodule. The nodule was hypermetabolic on PET CT. She was not a surgical candidate because of severe COPD with an FEV1 of 0.4. I did navigational bronchoscopy with biopsy and fiducial placement. It turned out to be a low-grade neuroendocrine tumor (carcinoid).  Given that she was not a surgical candidate and it was a low-grade tumor the consensus for multidisciplinary thoracic panel was to follow of this nodule radiographically.  I last saw her in March. She was at her baseline shortness of breath at that time but no acute changes. The nodule is unchanged.  In the interim since her last visit she's continued to have shortness of breath. It does vary from day to day but has not changed appreciably overall. She does have some cough and congestion but denies wheezing and flushing.  Past Medical History:  Diagnosis Date  . Cataract   . Family history of adverse reaction to anesthesia    " my sisiter was unable to move for a while with the spinal anesthesia."  . Hypothyroidism    nodule   . Nodule of left lung    lower lobe  . Pneumonia    as an infant  . Shortness of breath dyspnea    recently increased   . Skin cancer    nose- tx. with [facility]   . Thyroid nodule   . Vaginal delivery    x2 /w  one being breech     Current Outpatient Prescriptions  Medication Sig Dispense Refill  . anastrozole (ARIMIDEX) 1 MG tablet Take 1 tablet (1 mg total) by mouth daily. 30 tablet 6  . cholecalciferol (VITAMIN D) 1000 units tablet Take 1,000 Units by mouth daily.    [name] levothyroxine (SYNTHROID,  LEVOTHROID) 50 MCG tablet Take 50 mcg by mouth daily before breakfast.    . naproxen sodium (ANAPROX) 220 MG tablet Take 220 mg by mouth 2 (two) times daily as needed (for pain.).      No current facility-administered medications for this visit.     Physical Exam BP (!) 150/79 (BP Location: Right Arm, Patient Position: Sitting, Cuff Size: Normal)   Pulse 77   Resp 16   Ht 5' (1.524 m)   Wt 110 lb (49.9 kg)   SpO2 93% Comment: ON RA  BMI 21.48 kg/m  Elderly, frail-appearing 83 year old woman in no acute distress Alert and oriented 3 with no focal deficits Markedly diminished breath sounds bilaterally Cardiac regular rate and rhythm normal S1 and S2 No cervical or supraclavicular adenopathy  Diagnostic Tests: CT CHEST WITHOUT CONTRAST  TECHNIQUE: Multidetector CT imaging of the chest was performed following the standard protocol without IV contrast.  COMPARISON:  02/24/2017 chest CT.  FINDINGS: Cardiovascular: Normal heart size. No significant pericardial fluid/thickening. Left anterior descending, left circumflex and right coronary atherosclerosis. Atherosclerotic nonaneurysmal thoracic aorta. Aberrant right subclavian artery arising from the distal arch with retroesophageal course. Normal caliber pulmonary arteries.  Mediastinum/Nodes: Stable partially calcified 1.9 cm inferior right thyroid  lobe nodule. Unremarkable esophagus. No pathologically enlarged axillary, mediastinal or gross hilar lymph nodes, noting limited sensitivity for the detection of hilar adenopathy on this noncontrast study.  Lungs/Pleura: No pneumothorax. No pleural effusion. Stable calcified subcentimeter basilar right lower lobe granuloma. Solid anterior left lower lobe 1.7 x 1.0 cm pulmonary nodule (series 4/ image 77), previously 1.7 x 1.0 cm, stable. Several tiny solid pulmonary nodules scattered in both lungs, generally centrilobular in distribution, measuring up to 3 mm in the right  lower lobe (series 4/ image 67), all stable. No acute consolidative airspace disease, lung masses or new significant pulmonary nodules. Stable mild cylindrical and varicoid bronchiectasis in both lungs with associated mild patchy tree-in-bud opacities and scattered mild mucoid impaction, most prominent in the right middle lobe and lingula with associated stable small bandlike subpleural foci of postinfectious scarring.  Upper abdomen: Small hiatal hernia. Cholelithiasis. Stable granulomatous left lower lobe calcification.  Musculoskeletal: No aggressive appearing focal osseous lesions. Moderate thoracic spondylosis. Surgical clips are noted in the breasts bilaterally.  IMPRESSION: 1. Continued stability of 1.7 cm left lower lobe pulmonary carcinoid. 2. No findings of metastatic disease in the chest . 3. Stable mild bronchiectasis predominantly in the mid lungs and scattered tiny generally centrilobular nodules, most compatible with chronic infectious bronchiolitis due to mild indolent atypical mycobacterial infection (MAI). 4. Additional findings include aberrant right subclavian artery, three-vessel coronary atherosclerosis, small hiatal hernia and cholelithiasis.  Aortic Atherosclerosis (ICD10-I70.0).   Electronically Signed   By: Ilona Sorrel M.D.   On: 08/25/2017 15:27 I personally reviewed the CT chest and concur with the findings noted above.  Impression: Mrs. Fishburn is an 83 year old woman with severe COPD who has a 1.0 x 1.7 cm low-grade carcinoid tumor in the left lower lobe. She is not a candidate for surgical resection. This nodule has been relatively stable in size. At this point I don't see any reason to do anything other than continue to follow the nodule. It is unlikely to cause problems during her lifetime.  Breast cancer- been followed and treated by Dr. Lindi Adie  Plan: Return in one year with CT chest  Melrose Nakayama, MD [facility] [phone]

## 2017-09-04 ENCOUNTER — Telehealth: Payer: Self-pay | Admitting: *Deleted

## 2017-09-04 NOTE — Telephone Encounter (Signed)
Pt called with c/o joint pain in fingers and elbow as well as hot flashes and hair thinning. Informed pt to stop taking Arimidex x 2wks then come to discuss new AI. Received verbal understanding.

## 2017-09-12 IMAGING — MR MR BREAST BX W/ LOC DEV 1ST LEASION IMAGE BX SPEC MR GUIDE*R*
9 of 14 series · 33 of 48 positions shown · IV contrast (9ml Multihance)
Comparison: Previous exams.

ADDENDUM:
Pathology revealed LOW GRADE DUCTAL CARCINOMA IN SITU, FIBROADENOMA
AND FIBROCYSTIC CHANGE of the lower outer quadrant of the Right
breast. This was found to be concordant by Dr. Boris Alas.
Pathology results were discussed with the patient by telephone. The
patient reported doing well after the biopsy with tenderness at the
site. Post biopsy instructions and care were reviewed and questions
were answered. The patient was encouraged to call The [REDACTED] for any additional concerns. The patient has a
recent diagnosis of left breast cancer and should follow her
outlined treatment plan.

Pathology results reported by Babunand Lucky, RN on 06/20/2016.
CLINICAL DATA: Area of linear enhancement in the right breast lower
outer quadrant seen on recent MRI of the breast.
EXAM:
MRI GUIDED CORE NEEDLE BIOPSY OF THE RIGHT BREAST
TECHNIQUE: Multiplanar, multisequence MR imaging of the right breast was
performed both before and after administration of intravenous
contrast.
CONTRAST:  9mL MULTIHANCE GADOBENATE DIMEGLUMINE 529 MG/ML IV SOLN

[Series 2: axial pre-cm · axial · non-contrast · 1.3mm · 0.73mm/px · z∈[-68,+118]mm · 4 of 144 slices shown]
[im 1/144]
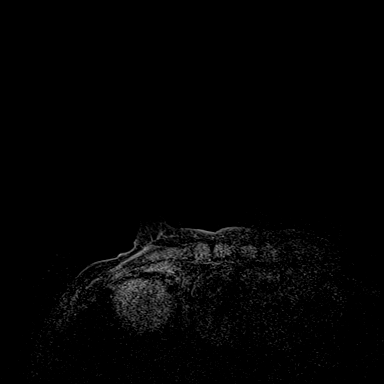
[im 48/144]
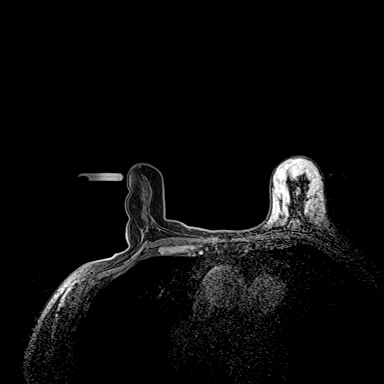
[im 96/144]
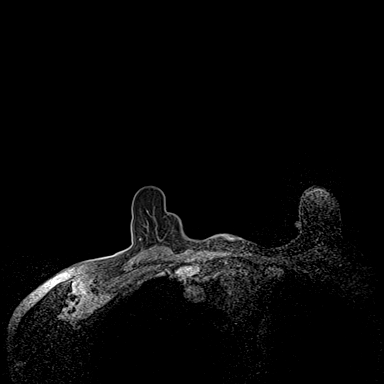
[im 144/144]
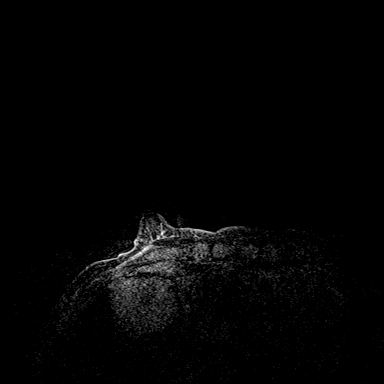

[Series 3: axial pre-cm_s2_(id) · axial · non-contrast · 1.3mm · 0.73mm/px · z∈[-68,+118]mm · 4 of 144 slices shown]
[im 1/144]
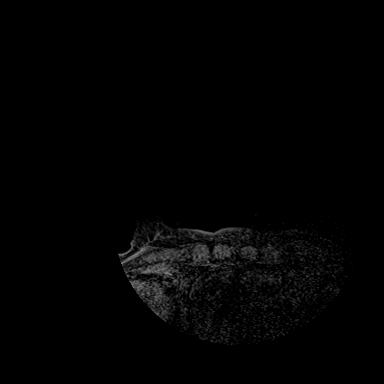
[im 48/144]
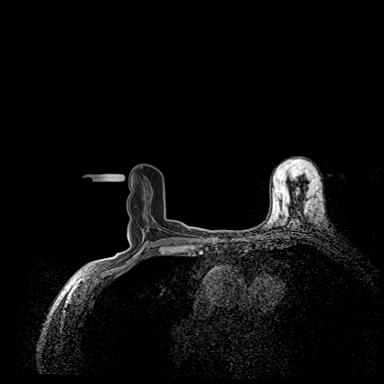
[im 96/144]
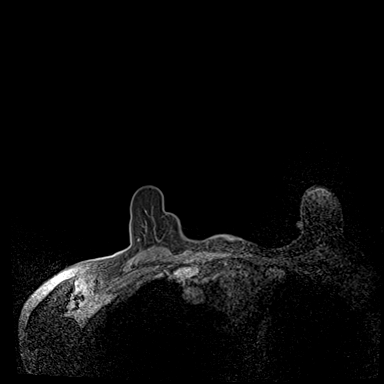
[im 144/144]
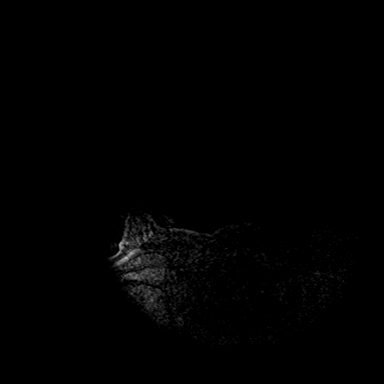

[Series 4: axial post 20 · axial · 1.3mm · 0.73mm/px · z∈[-68,+118]mm · 4 of 144 slices shown (1 of 3)]
[im 1/144]
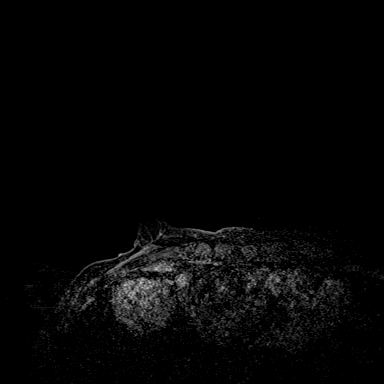
[im 48/144]
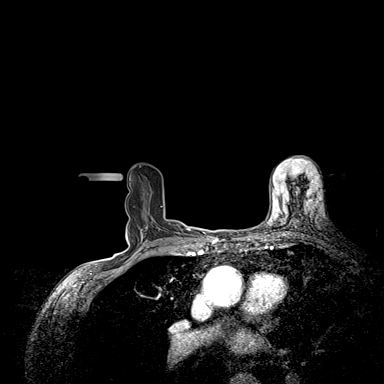
[im 96/144]
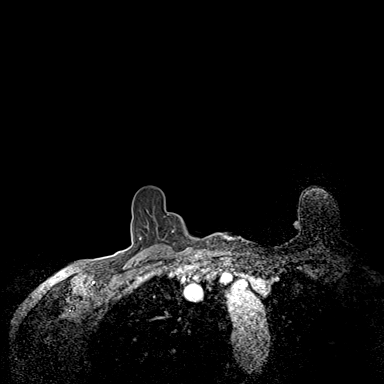
[im 144/144]
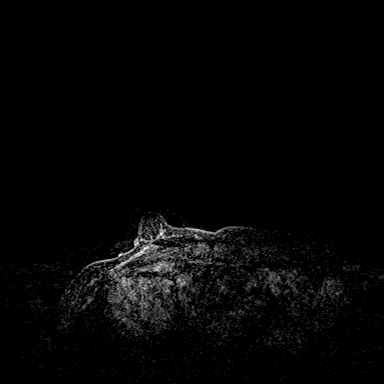

[Series 5: axial post 20 · axial · 1.3mm · 0.73mm/px · z∈[-68,+118]mm · 4 of 144 slices shown (2 of 3)]
[im 1/144]
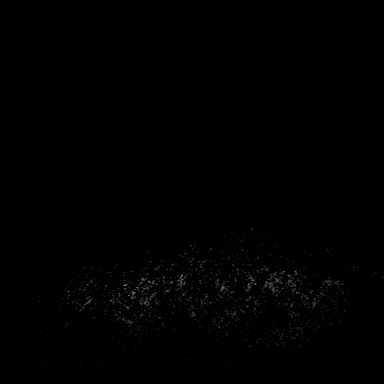
[im 48/144]
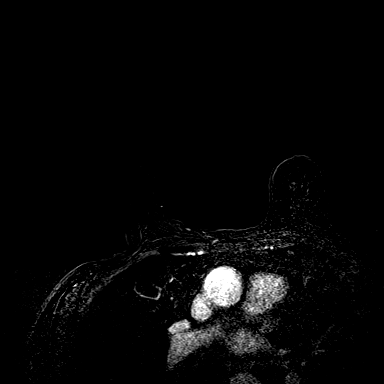
[im 96/144]
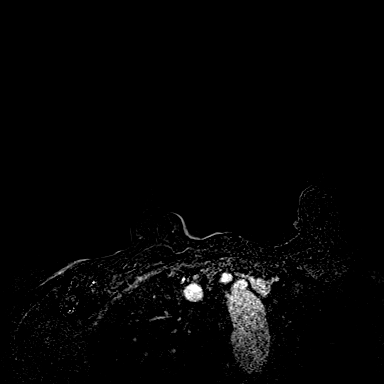
[im 144/144]
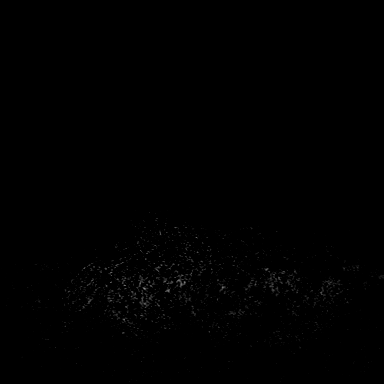

[Series 6: axial post 20 · axial · 1.3mm · 0.73mm/px · z∈[-68,+118]mm · 4 of 144 slices shown (3 of 3)]
[im 1/144]
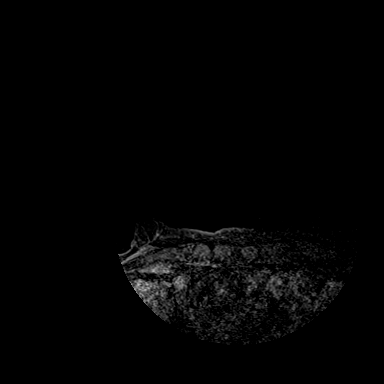
[im 48/144]
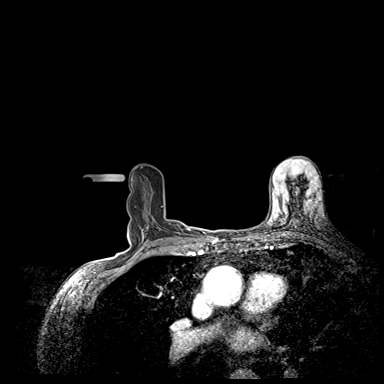
[im 96/144]
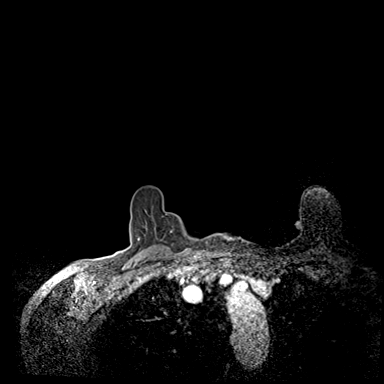
[im 144/144]
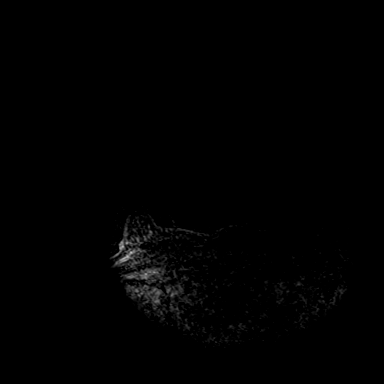

[Series 7: axial post 3 · axial · 1.3mm · 0.73mm/px · z∈[-68,+118]mm · 4 of 144 slices shown (1 of 3)]
[im 1/144]
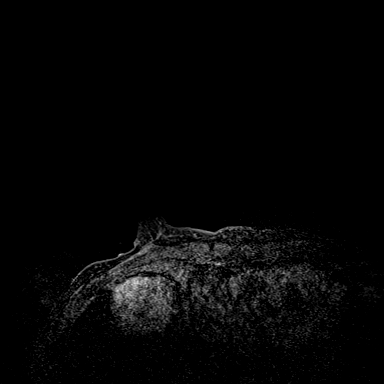
[im 48/144]
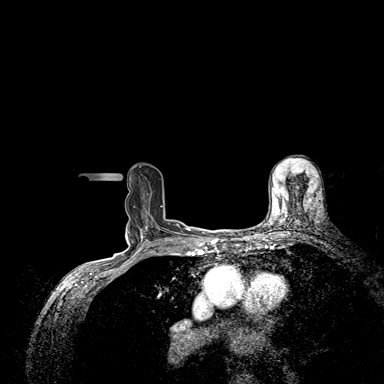
[im 96/144]
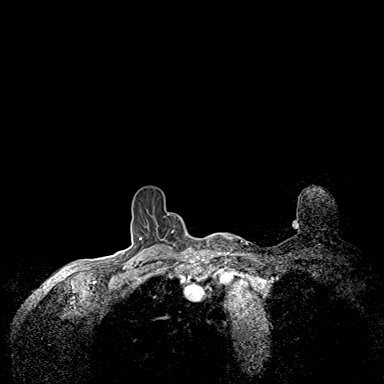
[im 144/144]
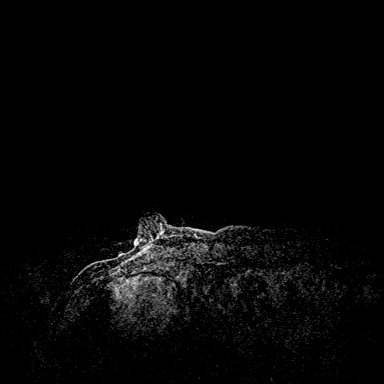

[Series 8: axial post 3 · axial · 1.3mm · 0.73mm/px · z∈[-68,+118]mm · 3 of 144 slices shown (2 of 3)]
[im 1/144]
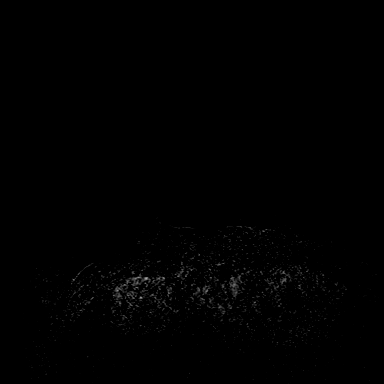
[im 72/144]
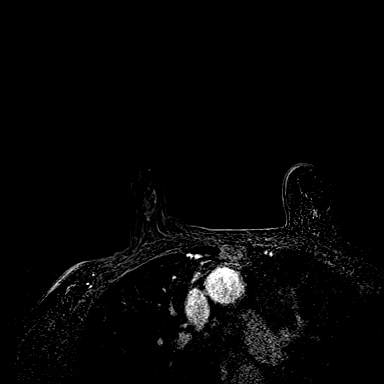
[im 144/144]
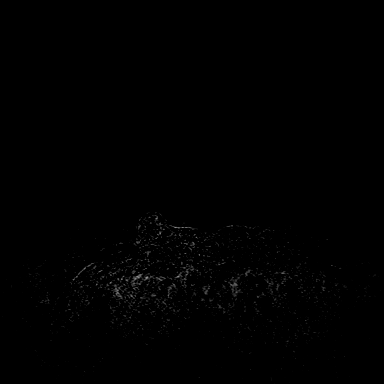

[Series 9: axial post 3 · axial · 1.3mm · 0.73mm/px · z∈[-68,+118]mm · 3 of 144 slices shown (3 of 3)]
[im 1/144]
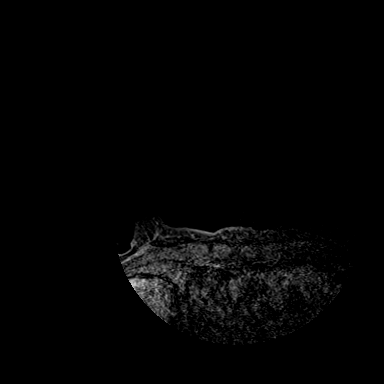
[im 72/144]
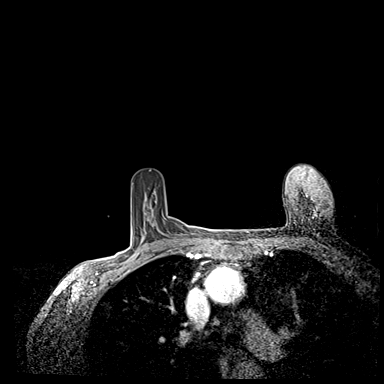
[im 144/144]
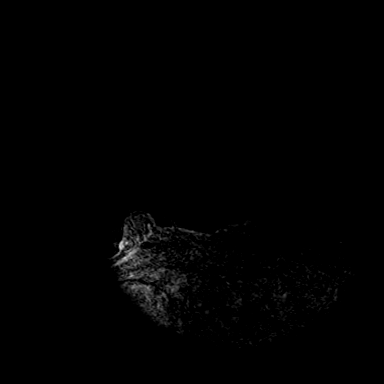

[Series 10: axial confirmation · axial · 1.3mm · 0.73mm/px · z∈[-68,+118]mm · 3 of 144 slices shown]
[im 1/144]
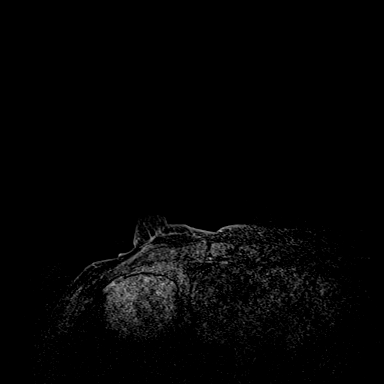
[im 72/144]
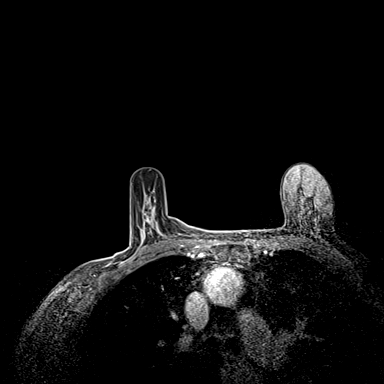
[im 144/144]
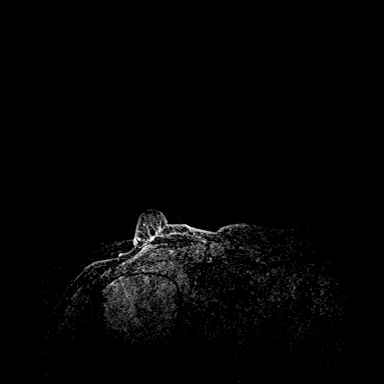

[33 of 48 positions shown; findings below may reference images not displayed]

FINDINGS: I met with the patient, and we discussed the procedure of MRI guided
biopsy, including risks, benefits, and alternatives. Specifically,
we discussed the risks of infection, bleeding, tissue injury, clip
migration, and inadequate sampling. Informed, written consent was
given. The usual time out protocol was performed immediately prior
to the procedure.

Using sterile technique, 1% Lidocaine, MRI guidance, and a 9 gauge
vacuum assisted device, biopsy was performed of area of linear
enhancement in the right breast lower outer quadrant using a lateral
approach. At the conclusion of the procedure, a dumbbell-shaped
tissue marker clip was deployed into the biopsy cavity. Follow-up
2-view mammogram was performed and dictated separately.
IMPRESSION: MRI guided biopsy of the right breast. No apparent complications.

## 2017-09-24 ENCOUNTER — Telehealth: Payer: Self-pay | Admitting: Hematology and Oncology

## 2017-09-24 ENCOUNTER — Ambulatory Visit (HOSPITAL_BASED_OUTPATIENT_CLINIC_OR_DEPARTMENT_OTHER): Payer: Medicare Other | Admitting: Hematology and Oncology

## 2017-09-24 DIAGNOSIS — Z170 Estrogen receptor positive status [ER+]: Secondary | ICD-10-CM | POA: Diagnosis not present

## 2017-09-24 DIAGNOSIS — C50412 Malignant neoplasm of upper-outer quadrant of left female breast: Secondary | ICD-10-CM | POA: Diagnosis not present

## 2017-09-24 DIAGNOSIS — C7A8 Other malignant neuroendocrine tumors: Secondary | ICD-10-CM | POA: Diagnosis not present

## 2017-09-24 DIAGNOSIS — D0511 Intraductal carcinoma in situ of right breast: Secondary | ICD-10-CM | POA: Diagnosis not present

## 2017-09-24 NOTE — Progress Notes (Signed)
Patient Care Team: Leighton Ruff, MD as PCP - General (Family Medicine) Nicholas Lose, MD as Consulting Physician (Hematology and Oncology) Fanny Skates, MD as Consulting Physician (General Surgery) Gery Pray, MD as Consulting Physician (Radiation Oncology)  DIAGNOSIS:  Encounter Diagnoses  Name Primary?  . Neuroendocrine carcinoma of lung ([place])   . Malignant neoplasm of upper-outer quadrant of left breast in female, estrogen receptor positive ([place])     SUMMARY OF ONCOLOGIC HISTORY:   Breast cancer of upper-outer quadrant of left female breast ([place])   05/20/2016 Initial Diagnosis    Left breast biopsy 2:30 position: IDC with DCIS, grade 1, ER 100%, PR 90%, Ki-67 5%, HER-2 negative ratio 1.33; is okay  1:30 position: 0.7 cm invasive lobular cancer, grade 1, ER/PR positive HER-2 negative Ki-67 10%, T1 N0 stage IA       06/09/2016 Breast MRI    Right breast: 1.3 x 0.5 x 0.8 cm linearly oriented mass extends posteriorly to the level of pectoralis muscle, no lymph nodes, 1.8 cm left lower lobe lung nodule      07/04/2016 PET scan    1.7 cm hypermetabolic nodule medial left lower lobe suspicious for lung cancer. Moderate emphysema, tiny subcentimeter pulmonary nodules too small to characterize      07/25/2016 Surgery    Right lumpectomy: DCIS 0.2 cm, margins negative, ER 95%, PR 70%; Tis N0 stage 0 Left lumpectomy: IDC with DCIS 0.8 cm ER 100%, PR 90%, HER-2 negative, Ki-67 5% and 0.5 cm ER 95%, PR 70%, HER-2 negative, Ki-67 10%, T1b NX stage I a multifocal       08/28/2016 -  Anti-estrogen oral therapy    Anastrozole 1 mg daily       Neuroendocrine carcinoma of lung ([place])   08/25/2016 Initial Diagnosis    Low Grade Neuroendocrine carcinoma of lung ([facility])       CHIEF COMPLIANT: unable to tolerate anastrozole  INTERVAL HISTORY: Victoria Rice is a 83 year old with above-mentioned history of right breast cancer and left breast DCIS treated with lumpectomies and was on  anastrozole for about a year and she developed side effects includingfatigue, difficulty breathing, hot flashes, swelling of the genital area, hair loss. She stopped taking anastrozole 2 weeks ago at our recommendation and she tells me that all the symptoms have improved. She does not want to take anastrozole any further.  REVIEW OF SYSTEMS:   Constitutional: Denies fevers, chills or abnormal weight loss Eyes: Denies blurriness of vision Ears, nose, mouth, throat, and face: Denies mucositis or sore throat Respiratory: Denies cough, dyspnea or wheezes Cardiovascular: Denies palpitation, chest discomfort Gastrointestinal:  Denies nausea, heartburn or change in bowel habits Skin: Denies abnormal skin rashes Lymphatics: Denies new lymphadenopathy or easy bruising Neurological:Denies numbness, tingling or new weaknesses Behavioral/Psych: Mood is stable, no new changes  Extremities: No lower extremity edema Breast:  denies any pain or lumps or nodules in either breasts All other systems were reviewed with the patient and are negative.  I have reviewed the past medical history, past surgical history, social history and family history with the patient and they are unchanged from previous note.  ALLERGIES:  is allergic to actonel [risedronate sodium].  MEDICATIONS:  Current Outpatient Prescriptions  Medication Sig Dispense Refill  . anastrozole (ARIMIDEX) 1 MG tablet Take 1 tablet (1 mg total) by mouth daily. 30 tablet 6  . cholecalciferol (VITAMIN D) 1000 units tablet Take 1,000 Units by mouth daily.    [name] levothyroxine (SYNTHROID, LEVOTHROID) 50 MCG tablet  Take 50 mcg by mouth daily before breakfast.    . naproxen sodium (ANAPROX) 220 MG tablet Take 220 mg by mouth 2 (two) times daily as needed (for pain.).      No current facility-administered medications for this visit.     PHYSICAL EXAMINATION: ECOG PERFORMANCE STATUS: 1 - Symptomatic but completely ambulatory  Vitals:   09/24/17 0959    BP: (!) 160/75  Pulse: 74  Resp: 18  Temp: 98.5 F (36.9 C)  SpO2: 97%   Filed Weights   09/24/17 0959  Weight: 112 lb 3.2 oz (50.9 kg)    GENERAL:alert, no distress and comfortable SKIN: skin color, texture, turgor are normal, no rashes or significant lesions EYES: normal, Conjunctiva are pink and non-injected, sclera clear OROPHARYNX:no exudate, no erythema and lips, buccal mucosa, and tongue normal  NECK: supple, thyroid normal size, non-tender, without nodularity LYMPH:  no palpable lymphadenopathy in the cervical, axillary or inguinal LUNGS: clear to auscultation and percussion with normal breathing effort HEART: regular rate & rhythm and no murmurs and no lower extremity edema ABDOMEN:abdomen soft, non-tender and normal bowel sounds MUSCULOSKELETAL:no cyanosis of digits and no clubbing  NEURO: alert & oriented x 3 with fluent speech, no focal motor/sensory deficits EXTREMITIES: No lower extremity edema  LABORATORY DATA:  I have reviewed the data as listed   Chemistry      Component Value Date/Time   NA 138 08/20/2016 1154   NA 139 05/28/2016 1232   K 4.7 08/20/2016 1154   K 4.6 05/28/2016 1232   CL 98 (L) 08/20/2016 1154   CO2 32 08/20/2016 1154   CO2 32 (H) 05/28/2016 1232   BUN 6 08/20/2016 1154   BUN 11.3 05/28/2016 1232   CREATININE 0.81 08/20/2016 1154   CREATININE 1.0 05/28/2016 1232      Component Value Date/Time   CALCIUM 9.8 08/20/2016 1154   CALCIUM 9.6 05/28/2016 1232   ALKPHOS 62 08/20/2016 1154   ALKPHOS 61 05/28/2016 1232   AST 22 08/20/2016 1154   AST 20 05/28/2016 1232   ALT 17 08/20/2016 1154   ALT 15 05/28/2016 1232   BILITOT 0.6 08/20/2016 1154   BILITOT 0.55 05/28/2016 1232       Lab Results  Component Value Date   WBC 5.9 08/20/2016   HGB 14.9 08/20/2016   HCT 47.4 (H) 08/20/2016   MCV 96.1 08/20/2016   PLT 280 08/20/2016   NEUTROABS 3.8 07/15/2016    ASSESSMENT & PLAN:  Neuroendocrine carcinoma of lung ([facility]) Given the  low grade nature of her disease and her advanced age, we elected to observe it and treat only if she develops carcinoid symptoms. CT chest August 2018: Stable nodule 1.7 cm Dr. Roxan Hockey is following her. He plans to do once a year CT scans.  Breast cancer of upper-outer quadrant of left female breast ([place]) Right lumpectomy: DCIS 0.2 cm, margins negative, ER 95%, PR 70%; Tis N0 stage 0  Left lumpectomy: IDC with DCIS 0.8 cm ER 100%, PR 90%, HER-2 negative, Ki-67 5% and 0.5 cm ER 95%, PR 70%, HER-2 negative, Ki-67 10%, T1b NX stage I a multifocal  Treatment plan: Adj Anti estrogen therapy with Anastrozole started September 2017 discontinued October 2018 due to toxicities Patient did not want to go on any further antiestrogen therapy given her advanced age  Surveillance: 74. Breast exam done 09/24/2017:Benign 2. Mammogram 07/22/2017: Benign breast density category B  Return to clinic in 1 year for follow-up   I spent 25 minutes  talking to the patient of which more than half was spent in counseling and coordination of care.  No orders of the defined types were placed in this encounter.  The patient has a good understanding of the overall plan. she agrees with it. she will call with any problems that may develop before the next visit here.   Rulon Eisenmenger, MD 09/24/17

## 2017-09-24 NOTE — Telephone Encounter (Signed)
Gave patient avs and calendar with appts per 10/18 los.

## 2017-09-24 NOTE — Assessment & Plan Note (Signed)
Given the low grade nature of her disease and her advanced age, we elected to observe it and treat only if she develops carcinoid symptoms.

## 2017-09-24 NOTE — Assessment & Plan Note (Signed)
Breast cancer of upper-outer quadrant of left female breast ([place]) Right lumpectomy: DCIS 0.2 cm, margins negative, ER 95%, PR 70%; Tis N0 stage 0  Left lumpectomy: IDC with DCIS 0.8 cm ER 100%, PR 90%, HER-2 negative, Ki-67 5% and 0.5 cm ER 95%, PR 70%, HER-2 negative, Ki-67 10%, T1b NX stage I a multifocal  Treatment plan: Adj Anti estrogen therapy with Anastrozole started September 2017 Anastrozole toxicities: 1. Occasional hot flashes Denies any myalgias or arthralgias.  Surveillance: 1. Breast exam done 09/24/2017:Benign 2. Mammogram 07/22/2017: Benign breast density category B  Return to clinic in 1 year for follow-up

## 2017-10-27 ENCOUNTER — Ambulatory Visit: Payer: Medicare Other | Admitting: Hematology and Oncology

## 2017-12-11 ENCOUNTER — Other Ambulatory Visit: Payer: Self-pay | Admitting: Family Medicine

## 2017-12-11 DIAGNOSIS — E041 Nontoxic single thyroid nodule: Secondary | ICD-10-CM

## 2018-06-09 ENCOUNTER — Ambulatory Visit: Payer: Self-pay | Admitting: Podiatry

## 2018-06-16 ENCOUNTER — Other Ambulatory Visit: Payer: Self-pay | Admitting: Hematology and Oncology

## 2018-06-16 ENCOUNTER — Other Ambulatory Visit: Payer: Self-pay | Admitting: Family Medicine

## 2018-06-16 DIAGNOSIS — Z9889 Other specified postprocedural states: Secondary | ICD-10-CM

## 2018-07-15 ENCOUNTER — Other Ambulatory Visit: Payer: Self-pay | Admitting: *Deleted

## 2018-07-15 DIAGNOSIS — D3A00 Benign carcinoid tumor of unspecified site: Secondary | ICD-10-CM

## 2018-07-23 ENCOUNTER — Ambulatory Visit
Admission: RE | Admit: 2018-07-23 | Discharge: 2018-07-23 | Disposition: A | Discharge status: Home / Self Care | Payer: Medicare Other | Source: Ambulatory Visit | Attending: Hematology and Oncology | Admitting: Hematology and Oncology

## 2018-07-23 DIAGNOSIS — Z9889 Other specified postprocedural states: Secondary | ICD-10-CM

## 2018-08-02 ENCOUNTER — Encounter (HOSPITAL_COMMUNITY): Payer: Self-pay

## 2018-08-02 ENCOUNTER — Telehealth: Payer: Self-pay | Admitting: Obstetrics and Gynecology

## 2018-08-02 ENCOUNTER — Other Ambulatory Visit: Payer: Self-pay

## 2018-08-02 ENCOUNTER — Inpatient Hospital Stay (HOSPITAL_COMMUNITY)
Admission: AD | Admit: 2018-08-02 | Discharge: 2018-08-02 | Disposition: A | Discharge status: Home / Self Care | Payer: Medicare Other | Source: Ambulatory Visit | Attending: Obstetrics and Gynecology | Admitting: Obstetrics and Gynecology

## 2018-08-02 DIAGNOSIS — E039 Hypothyroidism, unspecified: Secondary | ICD-10-CM | POA: Insufficient documentation

## 2018-08-02 DIAGNOSIS — Z8042 Family history of malignant neoplasm of prostate: Secondary | ICD-10-CM | POA: Diagnosis not present

## 2018-08-02 DIAGNOSIS — Z79890 Hormone replacement therapy (postmenopausal): Secondary | ICD-10-CM | POA: Insufficient documentation

## 2018-08-02 DIAGNOSIS — Z85828 Personal history of other malignant neoplasm of skin: Secondary | ICD-10-CM | POA: Diagnosis not present

## 2018-08-02 DIAGNOSIS — Z801 Family history of malignant neoplasm of trachea, bronchus and lung: Secondary | ICD-10-CM | POA: Insufficient documentation

## 2018-08-02 DIAGNOSIS — Z87891 Personal history of nicotine dependence: Secondary | ICD-10-CM | POA: Insufficient documentation

## 2018-08-02 DIAGNOSIS — Z9889 Other specified postprocedural states: Secondary | ICD-10-CM | POA: Insufficient documentation

## 2018-08-02 DIAGNOSIS — Z79899 Other long term (current) drug therapy: Secondary | ICD-10-CM | POA: Insufficient documentation

## 2018-08-02 DIAGNOSIS — Z888 Allergy status to other drugs, medicaments and biological substances status: Secondary | ICD-10-CM | POA: Diagnosis not present

## 2018-08-02 DIAGNOSIS — Z853 Personal history of malignant neoplasm of breast: Secondary | ICD-10-CM | POA: Diagnosis not present

## 2018-08-02 DIAGNOSIS — N764 Abscess of vulva: Secondary | ICD-10-CM | POA: Insufficient documentation

## 2018-08-02 HISTORY — DX: Malignant neoplasm of unspecified site of unspecified female breast: C50.919

## 2018-08-02 LAB — CBC WITH DIFFERENTIAL/PLATELET
BASOS ABS: 0 10*3/uL (ref 0.0–0.1)
BASOS PCT: 0 %
EOS ABS: 0.1 10*3/uL (ref 0.0–0.7)
Eosinophils Relative: 1 %
HEMATOCRIT: 43.8 % (ref 36.0–46.0)
HEMOGLOBIN: 14.1 g/dL (ref 12.0–15.0)
Lymphocytes Relative: 16 %
Lymphs Abs: 1.9 10*3/uL (ref 0.7–4.0)
MCH: 30.5 pg (ref 26.0–34.0)
MCHC: 32.2 g/dL (ref 30.0–36.0)
MCV: 94.8 fL (ref 78.0–100.0)
MONO ABS: 0.7 10*3/uL (ref 0.1–1.0)
MONOS PCT: 6 %
NEUTROS ABS: 9.2 10*3/uL — AB (ref 1.7–7.7)
NEUTROS PCT: 77 %
Platelets: 283 10*3/uL (ref 150–400)
RBC: 4.62 MIL/uL (ref 3.87–5.11)
RDW: 13.1 % (ref 11.5–15.5)
WBC: 12 10*3/uL — ABNORMAL HIGH (ref 4.0–10.5)

## 2018-08-02 LAB — BASIC METABOLIC PANEL
Anion gap: 10 (ref 5–15)
BUN: 15 mg/dL (ref 8–23)
CHLORIDE: 99 mmol/L (ref 98–111)
CO2: 27 mmol/L (ref 22–32)
CREATININE: 0.72 mg/dL (ref 0.44–1.00)
Calcium: 9 mg/dL (ref 8.9–10.3)
Glucose, Bld: 91 mg/dL (ref 70–99)
Potassium: 4.4 mmol/L (ref 3.5–5.1)
SODIUM: 136 mmol/L (ref 135–145)

## 2018-08-02 LAB — LACTIC ACID, PLASMA: LACTIC ACID, VENOUS: 0.7 mmol/L (ref 0.5–1.9)

## 2018-08-02 MED ORDER — LACTATED RINGERS IV SOLN
INTRAVENOUS | Status: DC
  Administered 2018-08-02: 18:00:00 via INTRAVENOUS

## 2018-08-02 MED ORDER — DOXYCYCLINE HYCLATE 100 MG PO CAPS: 100.0000 mg | ORAL_CAPSULE | Freq: Two times a day (BID) | ORAL | 0 refills | Status: AC

## 2018-08-02 MED ORDER — PIPERACILLIN-TAZOBACTAM 3.375 G IVPB 30 MIN
3.3750 g | Freq: Once | INTRAVENOUS | Status: AC
  Administered 2018-08-02: 3.375 g via INTRAVENOUS
  Filled 2018-08-02: qty 50

## 2018-08-02 MED ORDER — SULFAMETHOXAZOLE-TRIMETHOPRIM 800-160 MG PO TABS: 1.0000 | ORAL_TABLET | Freq: Two times a day (BID) | ORAL | 0 refills | Status: DC

## 2018-08-02 NOTE — Telephone Encounter (Signed)
Called patient about an appointment made for her on this Thursday. Left a VM for her to call us back for more information about the appointment.

## 2018-08-02 NOTE — MAU Provider Note (Signed)
History     CSN: [id number]  Arrival date and time: 08/02/18 1525   First Provider Initiated Contact with Patient 08/02/18 1708       HPI: Victoria Rice presents after seeing her PCP today for possible left labial abscess. Pt reports having a left labial "cyst" in April. Was given Doxycycline and the "cyst" decreased in size but never resolved. Was referred to OB/GYN for follow up but did not go since "cyst" was smaller. She denies any S/Sx of infection with the episode in April.  She reports the "cyst" started getting larger a few days ago. Became more tender and uncomfortable yesterday and today. Saw PCP who then referred her here.  She denies any fever, chills or drainage.   She is not sexual active nor has been in over 30 years. Postmenopausal Sx for over 30 yrs  NKDA  TSVD x 2   H/O left and right breast Ca. S/P lumpectomies. Stable. Sees Heme/Onc yearly. Last seen 10/18 Ho lung Cancer. Stable. Followed by CT.    Past Medical History:  Diagnosis Date  . Breast cancer ([place])   . Cataract   . Family history of adverse reaction to anesthesia    " my sisiter was unable to move for a while with the spinal anesthesia."  . Hypothyroidism    nodule   . Nodule of left lung    lower lobe  . Pneumonia    as an infant  . Shortness of breath dyspnea    recently increased   . Skin cancer    nose- tx. with [facility]   . Thyroid nodule   . Vaginal delivery    x2 /w  one being breech    Past Surgical History:  Procedure Laterality Date  . BREAST LUMPECTOMY Right 1970's   R breast  . BREAST LUMPECTOMY Bilateral 07/23/2016  . BREAST LUMPECTOMY WITH NEEDLE LOCALIZATION Left 07/23/2016   Procedure: LEFT BREAST LUMPECTOMY WITH DOUBLE NEEDLE LOCALIZATION;  Surgeon: Fanny Skates, MD;  Location: [facility];  Service: General;  Laterality: Left;  . BREAST LUMPECTOMY WITH RADIOACTIVE SEED LOCALIZATION Right 07/23/2016   Procedure: RIGHT BREAST LUMPECTOMY WITH RADIOACTIVE SEED  LOCALIZATION;  Surgeon: Fanny Skates, MD;  Location: [facility];  Service: General;  Laterality: Right;  . CATARACT EXTRACTION W/ INTRAOCULAR LENS IMPLANT     right eye  . EYE SURGERY Right    /w IOL- cataract removed   . FUDUCIAL PLACEMENT N/A 08/25/2016   Procedure: PLACEMENT OF FUDUCIAL;  Surgeon: Melrose Nakayama, MD;  Location: [facility];  Service: Thoracic;  Laterality: N/A;  . VARICOSE VEIN SURGERY Right    w/o stitches on R leg  . VIDEO BRONCHOSCOPY WITH ENDOBRONCHIAL NAVIGATION N/A 08/25/2016   Procedure: VIDEO BRONCHOSCOPY WITH ENDOBRONCHIAL NAVIGATION;  Surgeon: Melrose Nakayama, MD;  Location: [facility];  Service: Thoracic;  Laterality: N/A;    Family History  Problem Relation Age of Onset  . Prostate cancer Brother   . Lung cancer Brother     Social History   Tobacco Use  . Smoking status: Former Smoker    Packs/day: 0.75    Types: Cigarettes    Last attempt to quit: 05/08/1974    Years since quitting: 44.2  . Smokeless tobacco: Never Used  Substance Use Topics  . Alcohol use: Yes    Alcohol/week: 6.0 standard drinks    Types: 6 Glasses of wine per week    Comment: social  . Drug use: No    Allergies:  Allergies  Allergen Reactions  . Actonel [Risedronate Sodium] Other (See Comments)    Back pain     Medications Prior to Admission  Medication Sig Dispense Refill Last Dose  . cholecalciferol (VITAMIN D) 1000 units tablet Take 1,000 Units by mouth daily.   08/02/2018 at Unknown time  . levothyroxine (SYNTHROID, LEVOTHROID) 50 MCG tablet Take 50 mcg by mouth daily before breakfast.   08/02/2018 at Unknown time  . naproxen sodium (ANAPROX) 220 MG tablet Take 220 mg by mouth 2 (two) times daily as needed (for pain.).    Past Week at Unknown time  . Polyethyl Glycol-Propyl Glycol 0.4-0.3 % SOLN Apply 1 drop to eye as needed (dry eyes).   Past Week at Unknown time    Review of Systems  Constitutional: Negative.   Respiratory: Negative.   Cardiovascular: Negative.    Gastrointestinal: Negative.   Genitourinary: Positive for genital sores.   Physical Exam   Blood pressure (!) 156/68, pulse 88, temperature 98.2 F (36.8 C), temperature source Oral, resp. rate 18, weight 49.8 kg, SpO2 95 %.  Physical Exam  Constitutional: She appears well-developed and well-nourished.  Cardiovascular: Normal rate, regular rhythm and normal heart sounds.  Respiratory: Effort normal and breath sounds normal.  GI: Soft. Bowel sounds are normal.  Genitourinary:  Genitourinary Comments: Nl EGBUS, left labia abscess 6 cm x 2 cm, non fluctuant, min woody induration noted See picture in chart    MAU Course  Procedures None  MDM Pt was evaluated for sepsis. VS, Exam and labs do not indicate. Abscess is not mendable for I & D presently Pt given IV zosyn and started on Septra plus Doxycycline.    Assessment and Plan  Left Labia Abscess  As noted above. Septra and Doxycycline x 10 days F/U in clinic end of this week or first of next week. Instructed to return to MAU if Sx worsen, increased pain or swelling, no improvement or any concerns  Chancy Milroy 08/02/2018, 5:52 PM

## 2018-08-02 NOTE — Discharge Instructions (Signed)
Skin Abscess A skin abscess is an infected area on or under your skin that contains a collection of pus and other material. An abscess may also be called a furuncle, carbuncle, or boil. An abscess can occur in or on almost any part of your body. Some abscesses break open (rupture) on their own. Most continue to get worse unless they are treated. The infection can spread deeper into the body and eventually into your blood, which can make you feel ill. Treatment usually involves draining the abscess. What are the causes? An abscess occurs when germs, often bacteria, pass through your skin and cause an infection. This may be caused by:  A scrape or cut on your skin.  A puncture wound through your skin, including a needle injection.  Blocked oil or sweat glands.  Blocked and infected hair follicles.  A cyst that forms beneath your skin (sebaceous cyst) and becomes infected.  What increases the risk? This condition is more likely to develop in people who:  Have a weak body defense system (immune system).  Have diabetes.  Have dry and irritated skin.  Get frequent injections or use illegal IV drugs.  Have a foreign body in a wound, such as a splinter.  Have problems with their lymph system or veins.  What are the signs or symptoms? An abscess may start as a painful, firm bump under the skin. Over time, the abscess may get larger or become softer. Pus may appear at the top of the abscess, causing pressure and pain. It may eventually break through the skin and drain. Other symptoms include:  Redness.  Warmth.  Swelling.  Tenderness.  A sore on the skin.  How is this diagnosed? This condition is diagnosed based on your medical history and a physical exam. A sample of pus may be taken from the abscess to find out what is causing the infection and what antibiotics can be used to treat it. You also may have:  Blood tests to look for signs of infection or spread of an infection to  your blood.  Imaging studies such as ultrasound, CT scan, or MRI if the abscess is deep.  How is this treated? Small abscesses that drain on their own may not need treatment. Treatment for an abscess that does not rupture on its own may include:  Warm compresses applied to the area several times per day.  Incision and drainage. Your health care provider will make an incision to open the abscess and will remove pus and any foreign body or dead tissue. The incision area may be packed with gauze to keep it open for a few days while it heals.  Antibiotic medicines to treat infection. For a severe abscess, you may first get antibiotics through an IV and then change to oral antibiotics.  Follow these instructions at home: Abscess Care  If you have an abscess that has not drained, place a warm, clean, wet washcloth over the abscess several times a day. Do this as told by your health care provider.  Follow instructions from your health care provider about how to take care of your abscess. Make sure you: ? Cover the abscess with a bandage (dressing). ? Change your dressing or gauze as told by your health care provider. ? Wash your hands with soap and water before you change the dressing or gauze. If soap and water are not available, use hand sanitizer.  Check your abscess every day for signs of a worsening infection. Check for: ?  More redness, swelling, or pain. ? More fluid or blood. ? Warmth. ? More pus or a bad smell. Medicines  Take over-the-counter and prescription medicines only as told by your health care provider.  If you were prescribed an antibiotic medicine, take it as told by your health care provider. Do not stop taking the antibiotic even if you start to feel better. General instructions  To avoid spreading the infection: ? Do not share personal care items, towels, or hot tubs with others. ? Avoid making skin contact with other people.  Keep all follow-up visits as told by  your health care provider. This is important. Contact a health care provider if:  You have more redness, swelling, or pain around your abscess.  You have more fluid or blood coming from your abscess.  Your abscess feels warm to the touch.  You have more pus or a bad smell coming from your abscess.  You have a fever.  You have muscle aches.  You have chills or a general ill feeling. Get help right away if:  You have severe pain.  You see red streaks on your skin spreading away from the abscess. This information is not intended to replace advice given to you by your health care provider. Make sure you discuss any questions you have with your health care provider. Document Released: 09/03/2005 Document Revised: 07/20/2016 Document Reviewed: 10/03/2015 Elsevier Interactive Patient Education  2018 [employer].  How to Take a [employer] A sitz bath is a warm water bath that is taken while you are sitting down. The water should only come up to your hips and should cover your buttocks. Your health care provider may recommend a sitz bath to help you:  Clean the lower part of your body, including your genital area.  With itching.  With pain.  With sore muscles or muscles that tighten or spasm.  How to take a sitz bath Take 3-4 sitz baths per day or as told by your health care provider. 1. Partially fill a bathtub with warm water. You will only need the water to be deep enough to cover your hips and buttocks when you are sitting in it. 2. If your health care provider told you to put medicine in the water, follow the directions exactly. 3. Sit in the water and open the tub drain a little. 4. Turn on the warm water again to keep the tub at the correct level. Keep the water running constantly. 5. Soak in the water for 15-20 minutes or as told by your health care provider. 6. After the sitz bath, pat the affected area dry first. Do not rub it. 7. Be careful when you stand up after the  sitz bath because you may feel dizzy.  Contact a health care provider if:  Your symptoms get worse. Do not continue with sitz baths if your symptoms get worse.  You have new symptoms. Do not continue with sitz baths until you talk with your health care provider. This information is not intended to replace advice given to you by your health care provider. Make sure you discuss any questions you have with your health care provider. Document Released: 08/16/2004 Document Revised: 04/23/2016 Document Reviewed: 11/22/2014 Elsevier Interactive Patient Education  [employer].

## 2018-08-02 NOTE — MAU Note (Signed)
Several ? Cysts (small, hard, white) when she was seen  In April.  Was given rx, they decreased in size.  Has increased in size last few days, last night became very uncomfortable.  Say dr today, and she was referred here. Left labial cyst, very tender.

## 2018-08-05 ENCOUNTER — Ambulatory Visit (INDEPENDENT_AMBULATORY_CARE_PROVIDER_SITE_OTHER): Payer: Medicare Other | Admitting: Obstetrics and Gynecology

## 2018-08-05 ENCOUNTER — Encounter: Payer: Self-pay | Admitting: Obstetrics and Gynecology

## 2018-08-05 DIAGNOSIS — N764 Abscess of vulva: Secondary | ICD-10-CM | POA: Insufficient documentation

## 2018-08-05 NOTE — Progress Notes (Signed)
Victoria Rice is here for follow up of a left vulvar abscess. Seen in MAU on 08/02/18 and started on Septra Ds and Doxycycline. She reports feeling much better. Having little pain now. Denies fever or chills  PE AF VSS Lungs clear Heart RRR Abd soft + BS GU left vulvar abscess small, less erythema noted  A/P Left Vulvar abscess Improving with antibiotics. Will complete course. Warm compress/sitz baths bid. F/U in 2 weeks

## 2018-08-18 ENCOUNTER — Encounter: Payer: Self-pay | Admitting: Obstetrics and Gynecology

## 2018-08-18 ENCOUNTER — Ambulatory Visit: Payer: Medicare Other | Admitting: Obstetrics and Gynecology

## 2018-08-18 DIAGNOSIS — N907 Vulvar cyst: Secondary | ICD-10-CM

## 2018-08-18 HISTORY — DX: Vulvar cyst: N90.7

## 2018-08-18 NOTE — Progress Notes (Signed)
Victoria Rice presents for follow from her left vulvar abscess. She has completed her antibiotics She has no complaints today  PE AF VSS Lungs clear Heart RRR Abd soft + BS GU 3 sebaceous cyst noted on left labial as before, abscess has completely resolved  A/P Labial sebaceous cyst Discussed tx options for sebaceous cyst, conservative vs excision. Pt desires to follow for now. She will contact the office if she desires to have removed F/U PRN

## 2018-08-31 ENCOUNTER — Ambulatory Visit: Payer: Medicare Other | Admitting: Thoracic Surgery (Cardiothoracic Vascular Surgery)

## 2018-08-31 ENCOUNTER — Ambulatory Visit
Admission: RE | Admit: 2018-08-31 | Discharge: 2018-08-31 | Disposition: A | Discharge status: Home / Self Care | Payer: Medicare Other | Source: Ambulatory Visit | Attending: Thoracic Surgery (Cardiothoracic Vascular Surgery) | Admitting: Thoracic Surgery (Cardiothoracic Vascular Surgery)

## 2018-08-31 ENCOUNTER — Other Ambulatory Visit: Payer: Medicare Other

## 2018-08-31 ENCOUNTER — Encounter: Payer: Self-pay | Admitting: Thoracic Surgery (Cardiothoracic Vascular Surgery)

## 2018-08-31 ENCOUNTER — Other Ambulatory Visit: Payer: Self-pay

## 2018-08-31 VITALS — BP 160/62 | HR 83 | Resp 18 | Ht 60.0 in | Wt 109.2 lb

## 2018-08-31 DIAGNOSIS — D3A00 Benign carcinoid tumor of unspecified site: Secondary | ICD-10-CM

## 2018-08-31 NOTE — Progress Notes (Signed)
[address]       [place] [address]             [phone]    HPI: [name] returns for follow-up of a left lower lobe carcinoid tumor.  Victoria Rice is an 84 year old woman with history of breast cancer, COPD, and a left lower lobe carcinoid tumor.  She has remote history of tobacco abuse before quitting in 1975.  She was diagnosed with breast cancer back in 2017.  A CT of the chest showed a left lower lobe lung nodule.  This was hypermetabolic on PET/CT.  Her FEV1 was only 0.4.  Navigational bronchoscopy with biopsy and fiducial placement showed a low-grade neuroendocrine tumor (carcinoid).  We discussed the case at our multidisciplinary thoracic oncology conference and the consensus was to follow that nodule radiographically.  I last saw her in the office a year ago.  The nodule was unchanged at that time.  She did have some tree-in-bud changes in the right middle lobe.  She says her baseline respiratory status has not changed significantly over the past year.  She has good days and bad days.  She does not relate it much to the heat and humidity.  Past Medical History:  Diagnosis Date  . Breast cancer ([place])   . Cataract   . Family history of adverse reaction to anesthesia    " my sisiter was unable to move for a while with the spinal anesthesia."  . Hypothyroidism    nodule   . Nodule of left lung    lower lobe  . Pneumonia    as an infant  . Shortness of breath dyspnea    recently increased   . Skin cancer    nose- tx. with [facility]   . Thyroid nodule   . Vaginal delivery    x2 /w  one being breech    Current Outpatient Medications  Medication Sig Dispense Refill  . cholecalciferol (VITAMIN D) 1000 units tablet Take 1,000 Units by mouth daily.    [name] levothyroxine (SYNTHROID, LEVOTHROID) 50 MCG tablet Take 50 mcg by mouth daily before breakfast.    . naproxen sodium (ANAPROX) 220 MG tablet Take 220 mg by mouth 2 (two) times daily as needed  (for pain.).     [name] Polyethyl Glycol-Propyl Glycol 0.4-0.3 % SOLN Apply 1 drop to eye as needed (dry eyes).     No current facility-administered medications for this visit.     Physical Exam BP (!) 160/62 (BP Location: Right Arm, Patient Position: Sitting, Cuff Size: Small)   Pulse 83   Resp 18   Ht 5' (1.524 m)   Wt 109 lb 3.2 oz (49.5 kg)   SpO2 93% Comment: RA  BMI 21.18 kg/m  84 year old woman in no acute distress Alert and oriented x3 with no focal deficits Lungs with diminished breath sounds bilaterally no rales or wheezing No cervical or supraclavicular adenopathy Cardiac regular rate and rhythm normal S1 and S2  Diagnostic Tests: CT CHEST WITHOUT CONTRAST  TECHNIQUE: Multidetector CT imaging of the chest was performed following the standard protocol without IV contrast.  COMPARISON:  Chest CT 08/25/2017; chest CT 02/24/2017.  FINDINGS: Cardiovascular: Normal heart size. Trace fluid superior pericardial recess. Thoracic aortic and coronary arterial vascular calcifications.  Mediastinum/Nodes: No enlarged axillary, mediastinal or hilar lymphadenopathy. Small hiatal hernia. Otherwise normal appearance of the esophagus.  Lungs/Pleura: Central airways are patent. Stable 3 mm left lower lobe nodule (image 107;  series 8). Unchanged 1.6 x 1.0 cm solid left lower lobe nodule (image 96; series 8). Similar-appearing bronchiectasis within the lungs bilaterally with associated tree-in-bud nodularity, most focused in the right middle lobe. Grossly unchanged scattered bilateral pulmonary nodules. No pleural effusion or pneumothorax.  Upper Abdomen: No acute process.  Musculoskeletal: Thoracic spine degenerative changes. No aggressive or acute appearing osseous lesions.  IMPRESSION: 1. Unchanged 1.7 cm left lower lobe pulmonary nodule compatible with history of carcinoid. 2. No findings for metastatic disease in the chest. 3. Similar-appearing bilateral  nodularity favored represent atypical mycobacterial infection (MAI). 4. Aortic Atherosclerosis (ICD10-I70.0).   Electronically Signed   By: Lovey Newcomer M.D.   On: 08/31/2018 12:45  I personally reviewed the CT images and concur with the findings noted above.  The dominant left lower lobe nodule is not significantly changed.  There are multiple small granulomas that are unchanged.  She does have evolution of scarring in the right middle lobe   Impression: Victoria Rice is an 84 year old woman who was found to have a carcinoid tumor during a metastatic work-up for staging of breast cancer.  That was back in 2017.  Biopsy of the lung nodule showed a low-grade carcinoid tumor.  She was not a candidate for surgical resection due to severe COPD.  We have been following it since then.  She has not had any problems related to that.  I last saw her a year ago and the nodule is not significantly changed.  I also do not see a significant change over the past year.  We will continue to follow this radiographically.  COPD-relatively well controlled.  She says her respiratory status is stable overall.  Plan: Return in 1 year with CT chest  Melrose Nakayama, MD [facility] [phone]

## 2018-09-24 ENCOUNTER — Inpatient Hospital Stay: Payer: Medicare Other | Attending: Hematology and Oncology | Admitting: Hematology and Oncology

## 2018-09-24 ENCOUNTER — Telehealth: Payer: Self-pay | Admitting: Hematology and Oncology

## 2018-09-24 DIAGNOSIS — Z853 Personal history of malignant neoplasm of breast: Secondary | ICD-10-CM | POA: Diagnosis present

## 2018-09-24 DIAGNOSIS — Z79899 Other long term (current) drug therapy: Secondary | ICD-10-CM | POA: Diagnosis not present

## 2018-09-24 DIAGNOSIS — C50412 Malignant neoplasm of upper-outer quadrant of left female breast: Secondary | ICD-10-CM

## 2018-09-24 DIAGNOSIS — Z170 Estrogen receptor positive status [ER+]: Secondary | ICD-10-CM

## 2018-09-24 DIAGNOSIS — Z85118 Personal history of other malignant neoplasm of bronchus and lung: Secondary | ICD-10-CM | POA: Insufficient documentation

## 2018-09-24 NOTE — Progress Notes (Signed)
Patient Care Team: Leighton Ruff, MD as PCP - General (Family Medicine) Nicholas Lose, MD as Consulting Physician (Hematology and Oncology) Fanny Skates, MD as Consulting Physician (General Surgery) Gery Pray, MD as Consulting Physician (Radiation Oncology)  DIAGNOSIS:  Encounter Diagnosis  Name Primary?  . Malignant neoplasm of upper-outer quadrant of left breast in female, estrogen receptor positive ([place])     SUMMARY OF ONCOLOGIC HISTORY:   Breast cancer of upper-outer quadrant of left female breast ([place])   05/20/2016 Initial Diagnosis    Left breast biopsy 2:30 position: IDC with DCIS, grade 1, ER 100%, PR 90%, Ki-67 5%, HER-2 negative ratio 1.33; is okay  1:30 position: 0.7 cm invasive lobular cancer, grade 1, ER/PR positive HER-2 negative Ki-67 10%, T1 N0 stage IA     06/09/2016 Breast MRI    Right breast: 1.3 x 0.5 x 0.8 cm linearly oriented mass extends posteriorly to the level of pectoralis muscle, no lymph nodes, 1.8 cm left lower lobe lung nodule    07/04/2016 PET scan    1.7 cm hypermetabolic nodule medial left lower lobe suspicious for lung cancer. Moderate emphysema, tiny subcentimeter pulmonary nodules too small to characterize    07/25/2016 Surgery    Right lumpectomy: DCIS 0.2 cm, margins negative, ER 95%, PR 70%; Tis N0 stage 0 Left lumpectomy: IDC with DCIS 0.8 cm ER 100%, PR 90%, HER-2 negative, Ki-67 5% and 0.5 cm ER 95%, PR 70%, HER-2 negative, Ki-67 10%, T1b NX stage I a multifocal     08/28/2016 - 09/24/2017 Anti-estrogen oral therapy    Anastrozole 1 mg daily stopped for adverse effects like joint pain     Neuroendocrine carcinoma of lung ([place])   08/25/2016 Initial Diagnosis    Low Grade Neuroendocrine carcinoma of lung ([facility])     CHIEF COMPLIANT: Surveillance of breast cancer  INTERVAL HISTORY: Victoria Rice is a 84 year old with above-mentioned history of right breast cancer is currently on surveillance.  She could not tolerate antiestrogen  therapy and she stopped it a year ago.  Primarily she had loss of hair as well as facial hair development and muscle and joint pains.  She has been doing much better since she stopped it.  She denies any pain or discomfort.  August her mammograms were normal.  REVIEW OF SYSTEMS:   Constitutional: Denies fevers, chills or abnormal weight loss Eyes: Denies blurriness of vision Ears, nose, mouth, throat, and face: Denies mucositis or sore throat Respiratory: Denies cough, dyspnea or wheezes Cardiovascular: Denies palpitation, chest discomfort Gastrointestinal:  Denies nausea, heartburn or change in bowel habits Skin: Denies abnormal skin rashes Lymphatics: Denies new lymphadenopathy or easy bruising Neurological:Denies numbness, tingling or new weaknesses Behavioral/Psych: Mood is stable, no new changes  Extremities: No lower extremity edema Breast:  denies any pain or lumps or nodules in either breasts All other systems were reviewed with the patient and are negative.  I have reviewed the past medical history, past surgical history, social history and family history with the patient and they are unchanged from previous note.  ALLERGIES:  is allergic to actonel [risedronate sodium].  MEDICATIONS:  Current Outpatient Medications  Medication Sig Dispense Refill  . cholecalciferol (VITAMIN D) 1000 units tablet Take 1,000 Units by mouth daily.    [name] levothyroxine (SYNTHROID, LEVOTHROID) 50 MCG tablet Take 50 mcg by mouth daily before breakfast.    . naproxen sodium (ANAPROX) 220 MG tablet Take 220 mg by mouth 2 (two) times daily as needed (for pain.).     [name]  Polyethyl Glycol-Propyl Glycol 0.4-0.3 % SOLN Apply 1 drop to eye as needed (dry eyes).     No current facility-administered medications for this visit.     PHYSICAL EXAMINATION: ECOG PERFORMANCE STATUS: 1 - Symptomatic but completely ambulatory  Vitals:   09/24/18 0945  BP: (!) 150/85  Pulse: 75  Resp: 16  Temp: 98.3 F (36.8 C)    SpO2: 95%   Filed Weights   09/24/18 0945  Weight: 110 lb 1.6 oz (49.9 kg)    GENERAL:alert, no distress and comfortable SKIN: skin color, texture, turgor are normal, no rashes or significant lesions EYES: normal, Conjunctiva are pink and non-injected, sclera clear OROPHARYNX:no exudate, no erythema and lips, buccal mucosa, and tongue normal  NECK: supple, thyroid normal size, non-tender, without nodularity LYMPH:  no palpable lymphadenopathy in the cervical, axillary or inguinal LUNGS: clear to auscultation and percussion with normal breathing effort HEART: regular rate & rhythm and no murmurs and no lower extremity edema ABDOMEN:abdomen soft, non-tender and normal bowel sounds MUSCULOSKELETAL:no cyanosis of digits and no clubbing  NEURO: alert & oriented x 3 with fluent speech, no focal motor/sensory deficits EXTREMITIES: No lower extremity edema BREAST: No palpable masses or nodules in either right or left breasts. No palpable axillary supraclavicular or infraclavicular adenopathy no breast tenderness or nipple discharge. (exam performed in the presence of a chaperone)  LABORATORY DATA:  I have reviewed the data as listed CMP Latest Ref Rng & Units 08/02/2018 08/20/2016 07/15/2016  Glucose 70 - 99 mg/dL 91 93 95  BUN 8 - 23 mg/dL 15 6 9   Creatinine 0.44 - 1.00 mg/dL 0.72 0.81 0.74  Sodium 135 - 145 mmol/L 136 138 139  Potassium 3.5 - 5.1 mmol/L 4.4 4.7 4.4  Chloride 98 - 111 mmol/L 99 98(L) 101  CO2 22 - 32 mmol/L 27 32 31  Calcium 8.9 - 10.3 mg/dL 9.0 9.8 9.4  Total Protein 6.5 - 8.1 g/dL - 6.8 6.6  Total Bilirubin 0.3 - 1.2 mg/dL - 0.6 0.5  Alkaline Phos 38 - 126 U/L - 62 52  AST 15 - 41 U/L - 22 21  ALT 14 - 54 U/L - 17 16    Lab Results  Component Value Date   WBC 12.0 (H) 08/02/2018   HGB 14.1 08/02/2018   HCT 43.8 08/02/2018   MCV 94.8 08/02/2018   PLT 283 08/02/2018   NEUTROABS 9.2 (H) 08/02/2018    ASSESSMENT & PLAN:  Breast cancer of upper-outer quadrant of  left female breast ([place]) Given the low grade nature of her disease and her advanced age, we elected to observe it and treat only if she develops carcinoid symptoms. CT chest August 2018: Stable nodule 1.7 cm Dr. Roxan Hockey is following her. He plans to do once a year CT scans.  Breast cancer of upper-outer quadrant of left female breast ([place]) Right lumpectomy: DCIS 0.2 cm, margins negative, ER 95%, PR 70%; Tis N0 stage 0  Left lumpectomy: IDC with DCIS 0.8 cm ER 100%, PR 90%, HER-2 negative, Ki-67 5% and 0.5 cm ER 95%, PR 70%, HER-2 negative, Ki-67 10%, T1b NX stage I a multifocal  Treatment plan:Adj Anti estrogen therapy with Anastrozole started September 2017 discontinued October 2018 due to toxicities Patient did not want to go on any further antiestrogen therapy given her advanced age  Surveillance: 14. Breast exam done 09/24/2018:Benign 2. Mammogram 07/23/2018: Benign breast density category B  CT chest 08/31/2018: Unchanged 1.7 cm left lower lobe pulmonary nodule compatible with history  of carcinoid  Return to clinic in 1 year for follow-up   No orders of the defined types were placed in this encounter.  The patient has a good understanding of the overall plan. she agrees with it. she will call with any problems that may develop before the next visit here.   Harriette Ohara, MD 09/24/18

## 2018-09-24 NOTE — Telephone Encounter (Signed)
Gave pt avs and calendar  °

## 2018-09-24 NOTE — Assessment & Plan Note (Signed)
Given the low grade nature of her disease and her advanced age, we elected to observe it and treat only if she develops carcinoid symptoms. CT chest August 2018: Stable nodule 1.7 cm Dr. Roxan Hockey is following her. He plans to do once a year CT scans.  Breast cancer of upper-outer quadrant of left female breast ([place]) Right lumpectomy: DCIS 0.2 cm, margins negative, ER 95%, PR 70%; Tis N0 stage 0  Left lumpectomy: IDC with DCIS 0.8 cm ER 100%, PR 90%, HER-2 negative, Ki-67 5% and 0.5 cm ER 95%, PR 70%, HER-2 negative, Ki-67 10%, T1b NX stage I a multifocal  Treatment plan:Adj Anti estrogen therapy with Anastrozole started September 2017 discontinued October 2018 due to toxicities Patient did not want to go on any further antiestrogen therapy given her advanced age  Surveillance: 59. Breast exam done 09/24/2018:Benign 2. Mammogram 07/23/2018: Benign breast density category B  CT chest 08/31/2018: Unchanged 1.7 cm left lower lobe pulmonary nodule compatible with history of carcinoid  Return to clinic in 1 year for follow-up

## 2019-06-13 ENCOUNTER — Other Ambulatory Visit: Payer: Self-pay | Admitting: Hematology and Oncology

## 2019-06-13 DIAGNOSIS — Z9889 Other specified postprocedural states: Secondary | ICD-10-CM

## 2019-07-27 ENCOUNTER — Other Ambulatory Visit: Payer: Self-pay

## 2019-07-27 ENCOUNTER — Ambulatory Visit
Admission: RE | Admit: 2019-07-27 | Discharge: 2019-07-27 | Disposition: A | Discharge status: Home / Self Care | Payer: Medicare Other | Source: Ambulatory Visit | Attending: Hematology and Oncology | Admitting: Hematology and Oncology

## 2019-07-27 ENCOUNTER — Other Ambulatory Visit: Payer: Self-pay | Admitting: Thoracic Surgery (Cardiothoracic Vascular Surgery)

## 2019-07-27 DIAGNOSIS — Z9889 Other specified postprocedural states: Secondary | ICD-10-CM

## 2019-07-27 DIAGNOSIS — C3490 Malignant neoplasm of unspecified part of unspecified bronchus or lung: Secondary | ICD-10-CM

## 2019-09-01 ENCOUNTER — Ambulatory Visit
Admission: RE | Admit: 2019-09-01 | Discharge: 2019-09-01 | Disposition: A | Discharge status: Home / Self Care | Payer: Medicare Other | Source: Ambulatory Visit | Attending: Thoracic Surgery (Cardiothoracic Vascular Surgery) | Admitting: Thoracic Surgery (Cardiothoracic Vascular Surgery)

## 2019-09-01 ENCOUNTER — Telehealth: Payer: Self-pay | Admitting: Adult Health

## 2019-09-01 DIAGNOSIS — C3490 Malignant neoplasm of unspecified part of unspecified bronchus or lung: Secondary | ICD-10-CM

## 2019-09-01 NOTE — Telephone Encounter (Signed)
I talk with patient regarding move from 10/23 to 10/28

## 2019-09-06 ENCOUNTER — Encounter: Payer: Self-pay | Admitting: Thoracic Surgery (Cardiothoracic Vascular Surgery)

## 2019-09-06 ENCOUNTER — Ambulatory Visit (INDEPENDENT_AMBULATORY_CARE_PROVIDER_SITE_OTHER): Payer: Medicare Other | Admitting: Thoracic Surgery (Cardiothoracic Vascular Surgery)

## 2019-09-06 ENCOUNTER — Other Ambulatory Visit: Payer: Self-pay

## 2019-09-06 VITALS — BP 204/79 | HR 87 | Temp 97.9°F | Resp 18 | Ht 60.0 in | Wt 110.0 lb

## 2019-09-06 DIAGNOSIS — C7A8 Other malignant neuroendocrine tumors: Secondary | ICD-10-CM

## 2019-09-06 NOTE — Progress Notes (Signed)
[address]       [place] [address]             [phone]     HPI: Victoria Rice returns for an annual follow-up visit  Victoria Rice is an 85 year old woman with a history of breast cancer, remote tobacco abuse, COPD, and a left lower lobe carcinoid tumor.  Her carcinoid tumor was discovered back in 2017 when she was being worked up for breast cancer.  A CT showed a left lower lobe lung nodule.  On PET it was hypermetabolic.  Her FEV1 was only 0.4.  Navigational bronchoscopy showed a low-grade neuroendocrine tumor.  This was discussed in our multidisciplinary conference and the consensus was to follow this radiographically.  She has baseline dyspnea due to COPD.  That has not changed significantly.  She still has some good days and some bad days.  She had talked with Dr. Drema Dallas about seeing a pulmonologist back in June but decided to wait due to the [place] situation.  She has been taking appropriate precautions.  Past Medical History:  Diagnosis Date  . Breast cancer ([place])   . Cataract   . Family history of adverse reaction to anesthesia    " my sisiter was unable to move for a while with the spinal anesthesia."  . Hypothyroidism    nodule   . Nodule of left lung    lower lobe  . Pneumonia    as an infant  . Shortness of breath dyspnea    recently increased   . Skin cancer    nose- tx. with [facility]   . Thyroid nodule   . Vaginal delivery    x2 /w  one being breech     Current Outpatient Medications  Medication Sig Dispense Refill  . cholecalciferol (VITAMIN D) 1000 units tablet Take 1,000 Units by mouth daily.    [name] levothyroxine (SYNTHROID, LEVOTHROID) 50 MCG tablet Take 50 mcg by mouth daily before breakfast.    . naproxen sodium (ANAPROX) 220 MG tablet Take 220 mg by mouth 2 (two) times daily as needed (for pain.).     [name] Polyethyl Glycol-Propyl Glycol 0.4-0.3 % SOLN Apply 1 drop to eye as needed (dry eyes).     No current  facility-administered medications for this visit.     Physical Exam BP (!) 204/79 (BP Location: Left Arm, Patient Position: Sitting, Cuff Size: Small)   Pulse 87   Temp 97.9 F (36.6 C)   Resp 18   Ht 5' (1.524 m)   Wt 110 lb (49.9 kg)   SpO2 92% Comment: RA  BMI 21.48 kg/m  Repeat BP 28/65 85 year old woman in no acute distress Alert and oriented x3 with no focal deficits No cervical or supraclavicular adenopathy Cardiac regular rate and rhythm with normal S1 and S2 Lungs diminished breath sounds bilaterally, no wheezing  Diagnostic Tests: CT CHEST WITHOUT CONTRAST  TECHNIQUE: Multidetector CT imaging of the chest was performed following the standard protocol without IV contrast.  COMPARISON:  08/31/2018 chest CT.  FINDINGS: Cardiovascular: Normal heart size. No significant pericardial effusion/thickening. Three-vessel coronary atherosclerosis. Nonaneurysmal thoracic aorta. Stable ectatic aberrant right subclavian artery arising from the distal aortic arch with proximal diameter 1.7 cm with retroesophageal course. Normal caliber pulmonary arteries.  Mediastinum/Nodes: Stable heterogeneous thyroid gland with scattered small thyroid calcifications, without discrete thyroid nodules. Unremarkable esophagus. No pathologically enlarged axillary, mediastinal or hilar lymph nodes, noting limited sensitivity for the detection of hilar  adenopathy on this noncontrast study.  Lungs/Pleura: No pneumothorax. No pleural effusion. Stable calcified subcentimeter basilar right lower lobe granuloma. Dominant solid 1.7 x 1.0 cm anterior left lower lobe pulmonary nodule (series 8/image 90), previously 1.7 x 1.0 cm on 08/31/2018 chest CT using similar measurement technique, unchanged. A few additional subcentimeter solid pulmonary nodules scattered throughout both lungs, largest 6 mm in the basilar right upper lobe (series 8/image 69) and 6 mm in the posterior left upper lobe  (series 8/image 35) are all stable. No acute consolidative airspace disease or new significant pulmonary nodules. Stable scattered mild varicoid bronchiectasis in the right middle lobe, lingula and right lower lobe. Stable mosaic attenuation in both lungs.  Upper abdomen: Small hiatal hernia.  Cholelithiasis.  Musculoskeletal: No aggressive appearing focal osseous lesions. Moderate thoracic spondylosis.  IMPRESSION: 1. Dominant solid 1.7 cm anterior left lower lobe pulmonary nodule is stable, compatible with biopsy-proven pulmonary carcinoid. 2. Scattered subcentimeter solid pulmonary nodules in both lungs are all stable. Stable scattered bronchiectasis in the mid to lower lungs. Findings may represent the sequela of chronic atypical mycobacterial infection (MAI). 3. Chronic mosaic attenuation in both lungs, probably due to air trapping from small airways disease. 4. Three-vessel coronary atherosclerosis. 5. Ectatic hyper right subclavian artery. 6. Small hiatal hernia. 7. Cholelithiasis.  Aortic Atherosclerosis (ICD10-I70.0).   Electronically Signed   By: Ilona Sorrel M.D.   On: 09/01/2019 10:52 I personally reviewed the CT images and concur with the findings noted above  Impression: Victoria Rice is an 85 year old woman with a past history significant for remote tobacco abuse, severe COPD, carcinoid tumor of the left lower lobe, and breast cancer.  Left lower lobe carcinoid tumor-low-grade neuroendocrine tumor by biopsy.  Unchanged over 3 years.  We will continue with annual follow-up.  COPD-she will discuss possible pulmonary referral with Dr. Drema Dallas.  Hypertension-blood pressure was markedly elevated today.  She does not have any of history of hypertension.  She will have her blood pressure rechecked.  Coronary artery disease noted on CT.  No anginal symptoms.  Plan: I will plan to see her back in 1 year with a repeat CT of the chest  Melrose Nakayama,  MD [facility] [phone]

## 2019-09-21 ENCOUNTER — Ambulatory Visit: Payer: Medicare Other | Admitting: Cardiology

## 2019-09-21 ENCOUNTER — Encounter: Payer: Self-pay | Admitting: Cardiology

## 2019-09-21 ENCOUNTER — Other Ambulatory Visit: Payer: Self-pay

## 2019-09-21 VITALS — BP 138/80 | HR 87 | Ht 60.0 in | Wt 110.0 lb

## 2019-09-21 DIAGNOSIS — R0602 Shortness of breath: Secondary | ICD-10-CM | POA: Diagnosis not present

## 2019-09-21 DIAGNOSIS — J449 Chronic obstructive pulmonary disease, unspecified: Secondary | ICD-10-CM

## 2019-09-21 DIAGNOSIS — I493 Ventricular premature depolarization: Secondary | ICD-10-CM

## 2019-09-21 NOTE — Patient Instructions (Addendum)
Medication Instructions:  No changes If you need a refill on your cardiac medications before your next appointment, please call your pharmacy.   Lab work: none If you have labs (blood work) drawn today and your tests are completely normal, you will receive your results only by: [name] MyChart Message (if you have MyChart) OR . A paper copy in the mail If you have any lab test that is abnormal or we need to change your treatment, we will call you to review the results.  Testing/Procedures: Your physician has requested that you have an echocardiogram. Echocardiography is a painless test that uses sound waves to create images of your heart. It provides your doctor with information about the size and shape of your heart and how well your heart's chambers and valves are working. This procedure takes approximately one hour. There are no restrictions for this procedure.   Follow-Up: Follow up with your physician will depend on test results.  Any Other Special Instructions Will Be Listed Below (If Applicable). You have been referred to [facility] for further evaluation.

## 2019-09-21 NOTE — Progress Notes (Signed)
Cardiology Office Note:    Date:  09/21/2019   ID:  [name], DOB Aug 01, 1931, MRN [id number]  PCP:  Leighton Ruff, MD  Cardiologist:  Candee Furbish, MD  Electrophysiologist:  None   Referring MD: Leighton Ruff, MD     History of Present Illness:    Victoria Rice is a 85 y.o. female here for the evaluation of PVCs and shortness of breath at the request of Dr. Drema Dallas.  She is also seen by Dr. Roxan Hockey for left lower lobe carcinoid tumor, his history of breast cancer and COPD, quit smoking in 1975.  FEV1 was 0.4.  They are following this nodule radiographically.  Unchanged.  1.7 cm.  She also has aortic atherosclerosis present on CT scan.  Interesting CT scan finding: FINDINGS: Cardiovascular: Normal heart size. No significant pericardial effusion/thickening. Three-vessel coronary atherosclerosis. Nonaneurysmal thoracic aorta. Stable ectatic aberrant right subclavian artery arising from the distal aortic arch with proximal diameter 1.7 cm with retroesophageal course. Normal caliber pulmonary arteries.  Occasionally she can walk with only moderate shortness of breath but sometimes she walks and she has to stop more frequently to catch her breath.  Here while sitting on the exam table, I do notice that she does have some mild increased work of breathing.  She also notes that she has occasional cough with clear sputum production.  She denies any chest pressure when she is ambulating these distances.  Past Medical History:  Diagnosis Date  . Breast cancer ([place])   . Cataract   . COPD, severe ([place]) 07/31/2016  . Family history of adverse reaction to anesthesia    " my sisiter was unable to move for a while with the spinal anesthesia."  . Hypothyroidism    nodule   . Neuroendocrine carcinoma of lung ([place]) 10/26/2016  . Nodule of left lung    lower lobe  . Pneumonia    as an infant  . Sebaceous cyst of labia 08/18/2018  . Shortness of breath dyspnea    recently increased   . Skin cancer    nose- tx. with [facility]   . Thyroid nodule   . Vaginal delivery    x2 /w  one being breech    Past Surgical History:  Procedure Laterality Date  . BREAST LUMPECTOMY Right 1970's   R breast  . BREAST LUMPECTOMY Bilateral 07/23/2016  . BREAST LUMPECTOMY WITH NEEDLE LOCALIZATION Left 07/23/2016   Procedure: LEFT BREAST LUMPECTOMY WITH DOUBLE NEEDLE LOCALIZATION;  Surgeon: Fanny Skates, MD;  Location: [place];  Service: General;  Laterality: Left;  . BREAST LUMPECTOMY WITH RADIOACTIVE SEED LOCALIZATION Right 07/23/2016   Procedure: RIGHT BREAST LUMPECTOMY WITH RADIOACTIVE SEED LOCALIZATION;  Surgeon: Fanny Skates, MD;  Location: [place];  Service: General;  Laterality: Right;  . CATARACT EXTRACTION W/ INTRAOCULAR LENS IMPLANT     right eye  . EYE SURGERY Right    /w IOL- cataract removed   . FUDUCIAL PLACEMENT N/A 08/25/2016   Procedure: PLACEMENT OF FUDUCIAL;  Surgeon: Melrose Nakayama, MD;  Location: [place];  Service: Thoracic;  Laterality: N/A;  . VARICOSE VEIN SURGERY Right    w/o stitches on R leg  . VIDEO BRONCHOSCOPY WITH ENDOBRONCHIAL NAVIGATION N/A 08/25/2016   Procedure: VIDEO BRONCHOSCOPY WITH ENDOBRONCHIAL NAVIGATION;  Surgeon: Melrose Nakayama, MD;  [facility];  Service: Thoracic;  Laterality: N/A;    Current Medications: Current Meds  Medication Sig  . cholecalciferol (VITAMIN D) 1000 units tablet Take 1,000 Units by mouth  daily.  . levothyroxine (SYNTHROID, LEVOTHROID) 50 MCG tablet Take 50 mcg by mouth daily before breakfast.  . naproxen sodium (ANAPROX) 220 MG tablet Take 220 mg by mouth 2 (two) times daily as needed (for pain.).   [name] Polyethyl Glycol-Propyl Glycol 0.4-0.3 % SOLN Apply 1 drop to eye as needed (dry eyes).     Allergies:   Actonel [risedronate sodium]   Social History   Socioeconomic History  . Marital status: Widowed    Spouse name: Not on file  . Number of children: 2  . Years of education:  Not on file  . Highest education level: Not on file  Occupational History  . Not on file  Social Needs  . Financial resource strain: Not on file  . Food insecurity    Worry: Not on file    Inability: Not on file  . Transportation needs    Medical: Not on file    Non-medical: Not on file  Tobacco Use  . Smoking status: Former Smoker    Packs/day: 0.75    Types: Cigarettes    Quit date: 05/08/1974    Years since quitting: 45.4  . Smokeless tobacco: Never Used  Substance and Sexual Activity  . Alcohol use: Yes    Alcohol/week: 6.0 standard drinks    Types: 6 Glasses of wine per week    Comment: social  . Drug use: No  . Sexual activity: Never  Lifestyle  . Physical activity    Days per week: Not on file    Minutes per session: Not on file  . Stress: Not on file  Relationships  . Social Herbalist on phone: Not on file    Gets together: Not on file    Attends religious service: Not on file    Active member of club or organization: Not on file    Attends meetings of clubs or organizations: Not on file    Relationship status: Not on file  Other Topics Concern  . Not on file  Social History Narrative  . Not on file     Family History: The patient's family history includes Lung cancer in her brother; Prostate cancer in her brother.  ROS:   Please see the history of present illness.    No fevers chills nausea vomiting all other systems reviewed and are negative.  EKGs/Labs/Other Studies Reviewed:    The following studies were reviewed today: CT scan personally reviewed of chest, EKG, PFTs  EKG: 09/16/2019 shows sinus rhythm with PVC no other significant abnormalities.  Left atrial enlargement possible.  Recent Labs: No results found for requested labs within last 8760 hours.  Recent Lipid Panel No results found for: CHOL, TRIG, HDL, CHOLHDL, VLDL, LDLCALC, LDLDIRECT  Physical Exam:    VS:  BP 138/80   Pulse 87   Ht 5' (1.524 m)   Wt 110 lb (49.9 kg)    SpO2 94%   BMI 21.48 kg/m     Wt Readings from Last 3 Encounters:  09/21/19 110 lb (49.9 kg)  09/06/19 110 lb (49.9 kg)  09/24/18 110 lb 1.6 oz (49.9 kg)     GEN:  Well nourished, well developed in no acute distress HEENT: Normal NECK: No JVD; No carotid bruits LYMPHATICS: No lymphadenopathy CARDIAC: RRR, no murmurs, rubs, gallops RESPIRATORY: Distant breath sounds bilaterally.  Increased respiratory effort noted at rest  ABDOMEN: Soft, non-tender, non-distended MUSCULOSKELETAL:  No edema; No deformity  SKIN: Warm and dry NEUROLOGIC:  Alert and  oriented x 3 PSYCHIATRIC:  Normal affect   ASSESSMENT:    1. SOB (shortness of breath)   2. COPD, severe ([place])   3. PVC (premature ventricular contraction)    PLAN:    In order of problems listed above:  Shortness of breath -Her FEV1 is 0.4, quite significant COPD in 2017.  This is likely the overwhelming reason for her continued shortness of breath/dyspnea on exertion.  She has not been on any inhaler etc. -She does have coronary artery calcification noted on CT scan.  I think given her advanced age and continued surveillance of carcinoid tumor as well as advanced COPD, I will continue with conservative management approach.  Invasive angiography would play a reasonable role if she were having chest discomfort.  If she did have severe three-vessel coronary artery disease, I do not think that she would be an optimal candidate for bypass surgery. -I would like to go ahead and check an echocardiogram to evaluate her structure and function of her heart.  I mentioned to her the possibility of diagnostic angiography but she does not wish to go forward with any further aggressive testing.  I understand. -I will have her see pulmonary medicine as well.  Aberrant right subclavian artery -If she does undergo diagnostic angiography, a right femoral approach for left radial approach would be warranted.  Carcinoid tumor -1.7 x 1.0 cm stable.   Dr. Roxan Hockey  PVC -Seen on ECG benign.  No adverse arrhythmias detected.   Medication Adjustments/Labs and Tests Ordered: Current medicines are reviewed at length with the patient today.  Concerns regarding medicines are outlined above.  Orders Placed This Encounter  Procedures  . Ambulatory referral to Pulmonology  . ECHOCARDIOGRAM COMPLETE   No orders of the defined types were placed in this encounter.   Patient Instructions  Medication Instructions:  No changes If you need a refill on your cardiac medications before your next appointment, please call your pharmacy.   Lab work: none If you have labs (blood work) drawn today and your tests are completely normal, you will receive your results only by: [name] MyChart Message (if you have MyChart) OR . A paper copy in the mail If you have any lab test that is abnormal or we need to change your treatment, we will call you to review the results.  Testing/Procedures: Your physician has requested that you have an echocardiogram. Echocardiography is a painless test that uses sound waves to create images of your heart. It provides your doctor with information about the size and shape of your heart and how well your heart's chambers and valves are working. This procedure takes approximately one hour. There are no restrictions for this procedure.   Follow-Up: Follow up with your physician will depend on test results.  Any Other Special Instructions Will Be Listed Below (If Applicable). You have been referred to [facility] for further evaluation.     Signed, Candee Furbish, MD  09/21/2019 4:15 PM    [facility]

## 2019-09-26 ENCOUNTER — Other Ambulatory Visit: Payer: Self-pay

## 2019-09-26 ENCOUNTER — Ambulatory Visit (HOSPITAL_COMMUNITY): Payer: Medicare Other | Attending: Cardiovascular Disease

## 2019-09-26 DIAGNOSIS — J449 Chronic obstructive pulmonary disease, unspecified: Secondary | ICD-10-CM

## 2019-09-26 DIAGNOSIS — R0602 Shortness of breath: Secondary | ICD-10-CM | POA: Diagnosis present

## 2019-09-30 ENCOUNTER — Encounter: Payer: Medicare Other | Admitting: Adult Health

## 2019-10-04 ENCOUNTER — Other Ambulatory Visit: Payer: Self-pay

## 2019-10-04 ENCOUNTER — Encounter: Payer: Self-pay | Admitting: Critical Care Medicine

## 2019-10-04 ENCOUNTER — Ambulatory Visit: Payer: Medicare Other | Admitting: Critical Care Medicine

## 2019-10-04 VITALS — BP 144/78 | HR 91 | Temp 97.7°F | Ht 60.0 in | Wt 108.4 lb

## 2019-10-04 DIAGNOSIS — J479 Bronchiectasis, uncomplicated: Secondary | ICD-10-CM | POA: Diagnosis not present

## 2019-10-04 DIAGNOSIS — C7A8 Other malignant neuroendocrine tumors: Secondary | ICD-10-CM | POA: Diagnosis not present

## 2019-10-04 DIAGNOSIS — J449 Chronic obstructive pulmonary disease, unspecified: Secondary | ICD-10-CM

## 2019-10-04 MED ORDER — COMBIVENT RESPIMAT 20-100 MCG/ACT IN AERS: 1.0000 | INHALATION_SPRAY | Freq: Four times a day (QID) | RESPIRATORY_TRACT | 11 refills | Status: DC | PRN

## 2019-10-04 NOTE — Patient Instructions (Addendum)
Thank you for visiting Dr. Carlis Abbott at [facility]. We recommend the following:   Meds ordered this encounter  Medications  . Ipratropium-Albuterol (COMBIVENT RESPIMAT) 20-100 MCG/ACT AERS respimat    Sig: Inhale 1 puff into the lungs every 6 (six) hours as needed for wheezing or shortness of breath.    Dispense:  4 g    Refill:  11    Return in about 3 months (around 01/04/2020).    Please do your part to reduce the spread of COVID-19.

## 2019-10-04 NOTE — Progress Notes (Signed)
Synopsis: Referred in October 2020 for SOB by Jerline Pain, MD.  Subjective:   PATIENT ID: Victoria Rice GENDER: female DOB: 13-Aug-1931, MRN: [id number]  Chief Complaint  Patient presents with  . Consult    Referred by cardiology for copd, hx lung ca.     Victoria Rice is an 85 year old woman referred by cardiology for evaluation of COPD.  She had PFTs performed in 2017 and work-up of a lung nodule, and was found to have very severe COPD with bronchodilator reversibility.  She has never used maintenance inhalers and is resistant to doing so.  She has used nebulizers in the past and doctors offices associated with exacerbations of symptoms.  At baseline she has a morning cough productive of sputum, but does not cough throughout the day.  She has dyspnea on exertion periodically.  Some days she is able to walk up stairs or about a quarter of a mile without symptoms, but other days she has to stop multiple times to catch her breath.  She has not used albuterol in the past.  She denies a history of allergies.  Her symptoms are not worse with weather changes.  There is no family history of asthma or COPD, but she had a brother who had lung cancer and a cousin with cystic fibrosis.  She quit smoking in 1975 after about 20 years smoking less than 1 pack/day.  He recently was evaluated for shortness of breath by cardiology, who felt that her symptoms are more likely due to COPD.  She has never been hospitalized or required an ED visit for breathing symptoms.  She last had prednisone or antibiotics many years ago.  She has a stable left lower lobe carcinoid, for which she follows with Dr. Roxan Hockey from cardiothoracic surgery.  Today she feels like her symptoms are at baseline.  She has had her seasonal flu shot this year.     Past Medical History:  Diagnosis Date  . Breast cancer ([place])    lumpectomy, no chemo or XRT; 1 year of anastrazole  . Cataract   . COPD, severe ([place]) 07/31/2016  .  Family history of adverse reaction to anesthesia    " my sisiter was unable to move for a while with the spinal anesthesia."  . Hypothyroidism    nodule   . Neuroendocrine carcinoma of lung ([place]) 10/26/2016  . Nodule of left lung    lower lobe  . Pneumonia    as an infant  . Sebaceous cyst of labia 08/18/2018  . Shortness of breath dyspnea    recently increased   . Skin cancer    nose- tx. with [facility]   . Thyroid nodule   . Vaginal delivery    x2 /w  one being breech     Family History  Problem Relation Age of Onset  . Prostate cancer Brother   . Lung cancer Brother   . Cystic fibrosis Cousin      Past Surgical History:  Procedure Laterality Date  . BREAST LUMPECTOMY Right 1970's   R breast  . BREAST LUMPECTOMY Bilateral 07/23/2016  . BREAST LUMPECTOMY WITH NEEDLE LOCALIZATION Left 07/23/2016   Procedure: LEFT BREAST LUMPECTOMY WITH DOUBLE NEEDLE LOCALIZATION;  Surgeon: Fanny Skates, MD;  Location: [place];  Service: General;  Laterality: Left;  . BREAST LUMPECTOMY WITH RADIOACTIVE SEED LOCALIZATION Right 07/23/2016   Procedure: RIGHT BREAST LUMPECTOMY WITH RADIOACTIVE SEED LOCALIZATION;  Surgeon: Fanny Skates, MD;  Location: [place];  Service: General;  Laterality: Right;  . CATARACT EXTRACTION W/ INTRAOCULAR LENS IMPLANT     right eye  . EYE SURGERY Right    /w IOL- cataract removed   . FUDUCIAL PLACEMENT N/A 08/25/2016   Procedure: PLACEMENT OF FUDUCIAL;  Surgeon: Melrose Nakayama, MD;  Location: [place];  Service: Thoracic;  Laterality: N/A;  . VARICOSE VEIN SURGERY Right    w/o stitches on R leg  . VIDEO BRONCHOSCOPY WITH ENDOBRONCHIAL NAVIGATION N/A 08/25/2016   Procedure: VIDEO BRONCHOSCOPY WITH ENDOBRONCHIAL NAVIGATION;  Surgeon: Melrose Nakayama, MD;  Location: [place];  Service: Thoracic;  Laterality: N/A;    Social History   Socioeconomic History  . Marital status: Widowed    Spouse name: Not on file  . Number of children: 2  . Years of education:  Not on file  . Highest education level: Not on file  Occupational History  . Not on file  Social Needs  . Financial resource strain: Not on file  . Food insecurity    Worry: Not on file    Inability: Not on file  . Transportation needs    Medical: Not on file    Non-medical: Not on file  Tobacco Use  . Smoking status: Former Smoker    Packs/day: 0.75    Years: 20.00    Pack years: 15.00    Types: Cigarettes    Quit date: 05/08/1974    Years since quitting: 45.4  . Smokeless tobacco: Never Used  Substance and Sexual Activity  . Alcohol use: Yes    Alcohol/week: 6.0 standard drinks    Types: 6 Glasses of wine per week    Comment: social  . Drug use: No  . Sexual activity: Never  Lifestyle  . Physical activity    Days per week: Not on file    Minutes per session: Not on file  . Stress: Not on file  Relationships  . Social Herbalist on phone: Not on file    Gets together: Not on file    Attends religious service: Not on file    Active member of club or organization: Not on file    Attends meetings of clubs or organizations: Not on file    Relationship status: Not on file  . Intimate partner violence    Fear of current or ex partner: Not on file    Emotionally abused: Not on file    Physically abused: Not on file    Forced sexual activity: Not on file  Other Topics Concern  . Not on file  Social History Narrative  . Not on file     Allergies  Allergen Reactions  . Actonel [Risedronate Sodium] Other (See Comments)    indigestion     Immunization History  Administered Date(s) Administered  . Influenza, High Dose Seasonal PF 09/20/2019    Outpatient Medications Prior to Visit  Medication Sig Dispense Refill  . cholecalciferol (VITAMIN D) 1000 units tablet Take 1,000 Units by mouth daily.    [name] levothyroxine (SYNTHROID, LEVOTHROID) 50 MCG tablet Take 50 mcg by mouth daily before breakfast.    . naproxen sodium (ANAPROX) 220 MG tablet Take 220 mg by  mouth 2 (two) times daily as needed (for pain.).     [name] Polyethyl Glycol-Propyl Glycol 0.4-0.3 % SOLN Apply 1 drop to eye as needed (dry eyes).     No facility-administered medications prior to visit.     Review of Systems  Constitutional: Negative for chills, fever, malaise/fatigue  and weight loss.  HENT: Negative for congestion and sore throat.        Postnasal drip  Eyes: Negative.   Respiratory: Positive for cough, sputum production, shortness of breath and wheezing. Negative for hemoptysis.   Cardiovascular: Negative for chest pain, palpitations and leg swelling.  Gastrointestinal: Negative for blood in stool, heartburn, nausea and vomiting.  Genitourinary: Negative for hematuria.  Musculoskeletal: Negative.   Skin: Negative for rash.  Neurological: Negative for focal weakness.  Endo/Heme/Allergies: Negative for environmental allergies.  Psychiatric/Behavioral: The patient is nervous/anxious.      Objective:   Vitals:   10/04/19 1144  BP: (!) 144/78  Pulse: 91  Temp: 97.7 F (36.5 C)  TempSrc: Oral  SpO2: 96%  Weight: 108 lb 6.4 oz (49.2 kg)  Height: 5' (1.524 m)   96% on   RA BMI Readings from Last 3 Encounters:  10/04/19 21.17 kg/m  09/21/19 21.48 kg/m  09/06/19 21.48 kg/m   Wt Readings from Last 3 Encounters:  10/04/19 108 lb 6.4 oz (49.2 kg)  09/21/19 110 lb (49.9 kg)  09/06/19 110 lb (49.9 kg)    Physical Exam Vitals signs reviewed.  Constitutional:      General: She is not in acute distress.    Appearance: Normal appearance. She is not ill-appearing or diaphoretic.     Comments: Thin elderly woman  HENT:     Head: Normocephalic and atraumatic.     Nose:     Comments: Deferred due to masking requirement.    Mouth/Throat:     Comments: Deferred due to masking requirement. Eyes:     General: No scleral icterus. Neck:     Musculoskeletal: Neck supple.  Cardiovascular:     Rate and Rhythm: Normal rate and regular rhythm.     Heart sounds: No  murmur.  Pulmonary:     Comments: Tachypnea, mildly truncated speech.  Clear to auscultation bilaterally. Abdominal:     General: There is no distension.     Palpations: Abdomen is soft.     Tenderness: There is no abdominal tenderness.  Musculoskeletal:        General: No swelling or deformity.  Lymphadenopathy:     Cervical: No cervical adenopathy.  Skin:    General: Skin is warm.     Findings: No rash.  Neurological:     Mental Status: She is alert.  Psychiatric:        Mood and Affect: Mood normal.        Behavior: Behavior normal.      CBC    Component Value Date/Time   WBC 12.0 (H) 08/02/2018 1737   RBC 4.62 08/02/2018 1737   HGB 14.1 08/02/2018 1737   HGB 14.7 05/28/2016 1232   HCT 43.8 08/02/2018 1737   HCT 45.8 05/28/2016 1232   PLT 283 08/02/2018 1737   PLT 261 05/28/2016 1232   MCV 94.8 08/02/2018 1737   MCV 95.2 05/28/2016 1232   MCH 30.5 08/02/2018 1737   MCHC 32.2 08/02/2018 1737   RDW 13.1 08/02/2018 1737   RDW 13.0 05/28/2016 1232   LYMPHSABS 1.9 08/02/2018 1737   LYMPHSABS 1.5 05/28/2016 1232   MONOABS 0.7 08/02/2018 1737   MONOABS 0.5 05/28/2016 1232   EOSABS 0.1 08/02/2018 1737   EOSABS 0.1 05/28/2016 1232   BASOSABS 0.0 08/02/2018 1737   BASOSABS 0.0 05/28/2016 1232    CHEMISTRY CMP Latest Ref Rng & Units 08/02/2018 08/20/2016 07/15/2016  Glucose 70 - 99 mg/dL 91 93 95  BUN  8 - 23 mg/dL 15 6 9   Creatinine 0.44 - 1.00 mg/dL 0.72 0.81 0.74  Sodium 135 - 145 mmol/L 136 138 139  Potassium 3.5 - 5.1 mmol/L 4.4 4.7 4.4  Chloride 98 - 111 mmol/L 99 98(L) 101  CO2 22 - 32 mmol/L 27 32 31  Calcium 8.9 - 10.3 mg/dL 9.0 9.8 9.4  Total Protein 6.5 - 8.1 g/dL - 6.8 6.6  Total Bilirubin 0.3 - 1.2 mg/dL - 0.6 0.5  Alkaline Phos 38 - 126 U/L - 62 52  AST 15 - 41 U/L - 22 21  ALT 14 - 54 U/L - 17 16     Chest Imaging- films reviewed: CT chest 09/01/2019-mosaicism, emphysema, prominent airway thickening.  Metallic fiducial marker in left lower lobe.   Nodule in inferior lingula bronchiectasis in the right middle lobe with associated peripheral nodules.  Pulmonary Functions Testing Results: PFT Results Latest Ref Rng & Units 07/18/2016  FVC-Pre L 0.96  FVC-Predicted Pre % 50  FVC-Post L 1.24  FVC-Predicted Post % 65  Pre FEV1/FVC % % 49  Post FEV1/FCV % % 48  FEV1-Pre L 0.47  FEV1-Predicted Pre % 33  FEV1-Post L 0.60  DLCO UNC% % 80  DLCO COR %Predicted % 167  TLC L 5.06  TLC % Predicted % 117  RV % Predicted % 170  Very severe obstruction significant bronchodilator reversibility.  Significant air trapping.  Normal diffusion.  Pathology: 08/25/2016 left lower lobe-low-grade neuroendocrine tumor, carcinoid  Echocardiogram 09/26/2019: LVEF 60 to 62%, diastolic dysfunction.  Normal LA, RV, RA.  Trace MR, and TR.  Normal aortic valve.     Assessment & Plan:     ICD-10-CM   1. COPD, severe ([place])  J44.9 Ipratropium-Albuterol (COMBIVENT RESPIMAT) 20-100 MCG/ACT AERS respimat  2. Neuroendocrine carcinoma of lung ([place])  C7A.8   3. Bronchiectasis without complication ([facility])  I29.7     Severe COPD; FEV1 0.6 L in 2017.  Infrequent exacerbations, but frequent symptoms. -Recommend LAMA/LABA, which she is resistant to.  She does not want to be on a maintenance medication long-term despite the thorough explanation of the disease process and chronicity, and likely faster progression of lung function decline off maintenance meds.  Her main concern is side effects and needing a medication long-term.  She previously had hair loss when using anastrozole, and since then has been very hesitant to use medications.  We have discussed the low chance of significant side effects and possible side effects associated with inhalers, but she does not want to use a long-acting inhaler. -We will start Combivent as needed for symptoms.  She understands that this is more likely to cause side effects compared to long-acting medications. -Up-to-date on flu and  pneumonia vaccinations  LLL carcinoid- stable -Followed by CTS  RML bronchiectasis noted on CT scan -no follow up required at this time  RTC in 3 months.     Current Outpatient Medications:  .  cholecalciferol (VITAMIN D) 1000 units tablet, Take 1,000 Units by mouth daily., Disp: , Rfl:  .  levothyroxine (SYNTHROID, LEVOTHROID) 50 MCG tablet, Take 50 mcg by mouth daily before breakfast., Disp: , Rfl:  .  naproxen sodium (ANAPROX) 220 MG tablet, Take 220 mg by mouth 2 (two) times daily as needed (for pain.). , Disp: , Rfl:  .  Ipratropium-Albuterol (COMBIVENT RESPIMAT) 20-100 MCG/ACT AERS respimat, Inhale 1 puff into the lungs every 6 (six) hours as needed for wheezing or shortness of breath., Disp: 4 g, Rfl: 11  Julian Hy, DO [facility] 10/04/2019 1:27 PM

## 2019-10-05 ENCOUNTER — Other Ambulatory Visit: Payer: Self-pay

## 2019-10-05 ENCOUNTER — Inpatient Hospital Stay: Payer: Medicare Other | Attending: Adult Health | Admitting: Adult Health

## 2019-10-05 ENCOUNTER — Encounter: Payer: Self-pay | Admitting: Adult Health

## 2019-10-05 VITALS — BP 130/99 | HR 75 | Temp 98.2°F | Resp 18 | Wt 110.1 lb

## 2019-10-05 DIAGNOSIS — C7A8 Other malignant neuroendocrine tumors: Secondary | ICD-10-CM | POA: Diagnosis not present

## 2019-10-05 DIAGNOSIS — Z85828 Personal history of other malignant neoplasm of skin: Secondary | ICD-10-CM | POA: Diagnosis not present

## 2019-10-05 DIAGNOSIS — Z79811 Long term (current) use of aromatase inhibitors: Secondary | ICD-10-CM | POA: Diagnosis not present

## 2019-10-05 DIAGNOSIS — E039 Hypothyroidism, unspecified: Secondary | ICD-10-CM | POA: Insufficient documentation

## 2019-10-05 DIAGNOSIS — Z170 Estrogen receptor positive status [ER+]: Secondary | ICD-10-CM

## 2019-10-05 DIAGNOSIS — Z853 Personal history of malignant neoplasm of breast: Secondary | ICD-10-CM | POA: Insufficient documentation

## 2019-10-05 DIAGNOSIS — C50412 Malignant neoplasm of upper-outer quadrant of left female breast: Secondary | ICD-10-CM

## 2019-10-05 DIAGNOSIS — Z79899 Other long term (current) drug therapy: Secondary | ICD-10-CM | POA: Insufficient documentation

## 2019-10-05 DIAGNOSIS — J449 Chronic obstructive pulmonary disease, unspecified: Secondary | ICD-10-CM | POA: Insufficient documentation

## 2019-10-05 NOTE — Progress Notes (Signed)
CLINIC:  Survivorship   REASON FOR VISIT:  Routine follow-up for history of breast cancer.   BRIEF ONCOLOGIC HISTORY:  Oncology History  Breast cancer of upper-outer quadrant of left female breast ([place])  05/20/2016 Initial Diagnosis   Left breast biopsy 2:30 position: IDC with DCIS, grade 1, ER 100%, PR 90%, Ki-67 5%, HER-2 negative ratio 1.33; is okay  1:30 position: 0.7 cm invasive lobular cancer, grade 1, ER/PR positive HER-2 negative Ki-67 10%, T1 N0 stage IA    06/09/2016 Breast MRI   Right breast: 1.3 x 0.5 x 0.8 cm linearly oriented mass extends posteriorly to the level of pectoralis muscle, no lymph nodes, 1.8 cm left lower lobe lung nodule   07/04/2016 PET scan   1.7 cm hypermetabolic nodule medial left lower lobe suspicious for lung cancer. Moderate emphysema, tiny subcentimeter pulmonary nodules too small to characterize   07/25/2016 Surgery   Right lumpectomy: DCIS 0.2 cm, margins negative, ER 95%, PR 70%; Tis N0 stage 0 Left lumpectomy: IDC with DCIS 0.8 cm ER 100%, PR 90%, HER-2 negative, Ki-67 5% and 0.5 cm ER 95%, PR 70%, HER-2 negative, Ki-67 10%, T1b NX stage I a multifocal    08/28/2016 - 09/24/2017 Anti-estrogen oral therapy   Anastrozole 1 mg daily stopped for adverse effects like joint pain   Neuroendocrine carcinoma of lung ([place])  08/25/2016 Initial Diagnosis   Low Grade Neuroendocrine carcinoma of lung ([place])      INTERVAL HISTORY:  Victoria Rice presents to the Survivorship Clinic today for routine follow-up for her history of breast cancer.  Overall, she reports feeling quite well.   She underwent mammogram in 07/2019 that showed no evidence of malignancy and breast density category B.    She has COPD.  She is as active as she can be.  She is very upset that her hair has not regrown since stopping the Anastrozole.  She wants to know if I have any suggestions for this.  She denies any new issues today.    REVIEW OF SYSTEMS:  Review of Systems   Constitutional: Negative for appetite change, chills, fatigue, fever and unexpected weight change.  HENT:   Negative for hearing loss, lump/mass, nosebleeds, sore throat and trouble swallowing.   Eyes: Negative for eye problems and icterus.  Respiratory: Negative for chest tightness, cough and shortness of breath.   Cardiovascular: Negative for chest pain and leg swelling.  Gastrointestinal: Negative for abdominal distention, abdominal pain, constipation, diarrhea, nausea and vomiting.  Endocrine: Negative for hot flashes.  Musculoskeletal: Negative for arthralgias.  Skin: Negative for itching and rash.  Neurological: Negative for dizziness, extremity weakness, headaches and numbness.  Hematological: Negative for adenopathy. Does not bruise/bleed easily.  Psychiatric/Behavioral: Negative for depression. The patient is not nervous/anxious.   Breast: Denies any new nodularity, masses, tenderness, nipple changes, or nipple discharge.       PAST MEDICAL/SURGICAL HISTORY:  Past Medical History:  Diagnosis Date  . Breast cancer ([place])    lumpectomy, no chemo or XRT; 1 year of anastrazole  . Cataract   . COPD, severe ([place]) 07/31/2016  . Family history of adverse reaction to anesthesia    " my sisiter was unable to move for a while with the spinal anesthesia."  . Hypothyroidism    nodule   . Neuroendocrine carcinoma of lung ([place]) 10/26/2016  . Nodule of left lung    lower lobe  . Pneumonia    as an infant  . Sebaceous cyst of labia 08/18/2018  .  Shortness of breath dyspnea    recently increased   . Skin cancer    nose- tx. with [facility]   . Thyroid nodule   . Vaginal delivery    x2 /w  one being breech   Past Surgical History:  Procedure Laterality Date  . BREAST LUMPECTOMY Right 1970's   R breast  . BREAST LUMPECTOMY Bilateral 07/23/2016  . BREAST LUMPECTOMY WITH NEEDLE LOCALIZATION Left 07/23/2016   Procedure: LEFT BREAST LUMPECTOMY WITH DOUBLE NEEDLE LOCALIZATION;   Surgeon: Fanny Skates, MD;  Location: [facility];  Service: General;  Laterality: Left;  . BREAST LUMPECTOMY WITH RADIOACTIVE SEED LOCALIZATION Right 07/23/2016   Procedure: RIGHT BREAST LUMPECTOMY WITH RADIOACTIVE SEED LOCALIZATION;  Surgeon: Fanny Skates, MD;  Location: [facility];  Service: General;  Laterality: Right;  . CATARACT EXTRACTION W/ INTRAOCULAR LENS IMPLANT     right eye  . EYE SURGERY Right    /w IOL- cataract removed   . FUDUCIAL PLACEMENT N/A 08/25/2016   Procedure: PLACEMENT OF FUDUCIAL;  Surgeon: Melrose Nakayama, MD;  Location: [facility];  Service: Thoracic;  Laterality: N/A;  . VARICOSE VEIN SURGERY Right    w/o stitches on R leg  . VIDEO BRONCHOSCOPY WITH ENDOBRONCHIAL NAVIGATION N/A 08/25/2016   Procedure: VIDEO BRONCHOSCOPY WITH ENDOBRONCHIAL NAVIGATION;  Surgeon: Melrose Nakayama, MD;  Location: [facility];  Service: Thoracic;  Laterality: N/A;     ALLERGIES:  Allergies  Allergen Reactions  . Actonel [Risedronate Sodium] Other (See Comments)    indigestion     CURRENT MEDICATIONS:  Outpatient Encounter Medications as of 10/05/2019  Medication Sig  . cholecalciferol (VITAMIN D) 1000 units tablet Take 1,000 Units by mouth daily.  . Ipratropium-Albuterol (COMBIVENT RESPIMAT) 20-100 MCG/ACT AERS respimat Inhale 1 puff into the lungs every 6 (six) hours as needed for wheezing or shortness of breath.  . levothyroxine (SYNTHROID, LEVOTHROID) 50 MCG tablet Take 50 mcg by mouth daily before breakfast.  . naproxen sodium (ANAPROX) 220 MG tablet Take 220 mg by mouth 2 (two) times daily as needed (for pain.).    No facility-administered encounter medications on file as of 10/05/2019.      ONCOLOGIC FAMILY HISTORY:  Family History  Problem Relation Age of Onset  . Prostate cancer Brother   . Lung cancer Brother   . Cystic fibrosis Cousin     GENETIC COUNSELING/TESTING: Not at this tim  SOCIAL HISTORY:  Social History   Socioeconomic History  . Marital status:  Widowed    Spouse name: Not on file  . Number of children: 2  . Years of education: Not on file  . Highest education level: Not on file  Occupational History  . Not on file  Social Needs  . Financial resource strain: Not on file  . Food insecurity    Worry: Not on file    Inability: Not on file  . Transportation needs    Medical: Not on file    Non-medical: Not on file  Tobacco Use  . Smoking status: Former Smoker    Packs/day: 0.75    Years: 20.00    Pack years: 15.00    Types: Cigarettes    Quit date: 05/08/1974    Years since quitting: 45.4  . Smokeless tobacco: Never Used  Substance and Sexual Activity  . Alcohol use: Yes    Alcohol/week: 6.0 standard drinks    Types: 6 Glasses of wine per week    Comment: social  . Drug use: No  . Sexual  activity: Never  Lifestyle  . Physical activity    Days per week: Not on file    Minutes per session: Not on file  . Stress: Not on file  Relationships  . Social Herbalist on phone: Not on file    Gets together: Not on file    Attends religious service: Not on file    Active member of club or organization: Not on file    Attends meetings of clubs or organizations: Not on file    Relationship status: Not on file  . Intimate partner violence    Fear of current or ex partner: Not on file    Emotionally abused: Not on file    Physically abused: Not on file    Forced sexual activity: Not on file  Other Topics Concern  . Not on file  Social History Narrative  . Not on file      PHYSICAL EXAMINATION:  Vital Signs: Vitals:   10/05/19 1302  BP: (!) 130/99  Pulse: 75  Resp: 18  Temp: 98.2 F (36.8 C)  SpO2: 91%   Filed Weights   10/05/19 1302  Weight: 110 lb 1.6 oz (49.9 kg)   General: Well-nourished, well-appearing female in no acute distress.  Unaccompanied today.   HEENT: Head is normocephalic.  Pupils equal and reactive to light. Conjunctivae clear without exudate.  Sclerae anicteric. Oral mucosa is  pink, moist.  Oropharynx is pink without lesions or erythema.  Lymph: No cervical, supraclavicular, or infraclavicular lymphadenopathy noted on palpation.  Cardiovascular: Regular rate and rhythm.[name] Respiratory: Clear to auscultation bilaterally. Chest expansion symmetric; breathing non-labored.  Breast Exam:  -Left breast: No appreciable masses on palpation. No skin redness, thickening, or peau d'orange appearance; no nipple retraction or nipple discharge; mild distortion in symmetry at previous lumpectomy site well healed scar without erythema or nodularity.  -Right breast: No appreciable masses on palpation. No skin redness, thickening, or peau d'orange appearance; no nipple retraction or nipple discharge;. -Axilla: No axillary adenopathy bilaterally.  GI: Abdomen soft and round; non-tender, non-distended. Bowel sounds normoactive. No hepatosplenomegaly.   GU: Deferred.  Neuro: No focal deficits. Steady gait.  Psych: Mood and affect normal and appropriate for situation.  MSK: No focal spinal tenderness to palpation, full range of motion in bilateral upper extremities Extremities: No edema. Skin: Warm and dry.  LABORATORY DATA:  None for this visit   DIAGNOSTIC IMAGING:  Most recent mammogram:  EXAM: DIGITAL DIAGNOSTIC BILATERAL MAMMOGRAM WITH CAD AND TOMO  COMPARISON:  Previous exam(s).  ACR Breast Density Category b: There are scattered areas of fibroglandular density.  FINDINGS: Bilateral lumpectomy sites are stable. No suspicious masses, calcifications, or distortion in either breast.  Mammographic images were processed with CAD.  IMPRESSION: No mammographic evidence of malignancy.  RECOMMENDATION: Annual diagnostic mammography.  I have discussed the findings and recommendations with the patient. Results were also provided in writing at the conclusion of the visit. If applicable, a reminder letter will be sent to the patient regarding the next appointment.   BI-RADS CATEGORY  2: Benign.   Electronically Signed   By: Dorise Bullion III M.D   On: 07/27/2019 10:55    ASSESSMENT AND PLAN:  Ms.. Rice is a pleasant 85 y.o. female with history of Stage IA left breast invasive ductal carcinoma, ER+/PR+/HER2-, diagnosed in 05/2016, treated with lumpectomy and anti estrogen therapy with Anastrozole x 1 year (unable to tolerate and completed in 09/2017).  She presents to the Survivorship Clinic  for surveillance and routine follow-up.   1. History of breast cancer:  Victoria Rice is currently clinically and radiographically without evidence of disease or recurrence of breast cancer. She will be due for mammogram in 07/2020; orders placed today.  She and I discussed the role of survivorship for her during this phase.  She questions why she needs to return here if she isn't taking any anti estrogen therapy.  I let her know that we recommend that she undergo surveillance for recurrence.  She understands this, however is going to consider whether to seek surveillance with her PCP or with [place], which is entirely up to her.  I reviewed the recommendation for annual diagnostic mammograms through 2022.  I reviewed her overall health recommendations  I encouraged her to call me with any questions or concerns before her next visit at the cancer center, and I would be happy to see her sooner, if needed.    2. Hair thinning: We reviewed things that can reduced breakage of hair and use of biotin.  I placed a referral to dermatology today.   3. Bone health:  Given Victoria Rice's age, history of breast cancer, and her previous anti-estrogen therapy with Anastrozole, she is at risk for bone demineralization. She does not want to undergo bone density testing at this time.  She was given education on specific food and activities to promote bone health.  4. Cancer screening:  Due to Victoria Rice's history and her age, she should receive screening for skin cancers, colon cancer. She  was encouraged to follow-up with her PCP for appropriate cancer screenings.   5. Health maintenance and wellness promotion: Victoria Rice was encouraged to consume 5-7 servings of fruits and vegetables per day. She was also encouraged to engage in moderate to vigorous exercise for 30 minutes per day most days of the week. She was instructed to limit her alcohol consumption and continue to abstain from tobacco use.      Dispo:  -Return to cancer center in one year for LTS f/u -Mammogram in 07/2020 -referral to dermatology   A total of (30) minutes of face-to-face time was spent with this patient with greater than 50% of that time in counseling and care-coordination.   Gardenia Phlegm, NP Surviv[facility] [place] [phone]   Note: PRIMARY CARE PROVIDER Leighton Ruff, [place] [phone]

## 2019-10-05 NOTE — Patient Instructions (Signed)
Bone Health Bones protect organs, store calcium, anchor muscles, and support the whole body. Keeping your bones strong is important, especially as you get older. You can take actions to help keep your bones strong and healthy. Why is keeping my bones healthy important?  Keeping your bones healthy is important because your body constantly replaces bone cells. Cells get old, and new cells take their place. As we age, we lose bone cells because the body may not be able to make enough new cells to replace the old cells. The amount of bone cells and bone tissue you have is referred to as bone mass. The higher your bone mass, the stronger your bones. The aging process leads to an overall loss of bone mass in the body, which can increase the likelihood of:  Joint pain and stiffness.  Broken bones.  A condition in which the bones become weak and brittle (osteoporosis). A large decline in bone mass occurs in older adults. In women, it occurs about the time of menopause. What actions can I take to keep my bones healthy? Good health habits are important for maintaining healthy bones. This includes eating nutritious foods and exercising regularly. To have healthy bones, you need to get enough of the right minerals and vitamins. Most nutrition experts recommend getting these nutrients from the foods that you eat. In some cases, taking supplements may also be recommended. Doing certain types of exercise is also important for bone health. What are the nutritional recommendations for healthy bones?  Eating a well-balanced diet with plenty of calcium and vitamin D will help to protect your bones. Nutritional recommendations vary from person to person. Ask your health care provider what is healthy for you. Here are some general guidelines. Get enough calcium Calcium is the most important (essential) mineral for bone health. Most people can get enough calcium from their diet, but supplements may be recommended for  people who are at risk for osteoporosis. Good sources of calcium include:  Dairy products, such as low-fat or nonfat milk, cheese, and yogurt.  Dark green leafy vegetables, such as bok choy and broccoli.  Calcium-fortified foods, such as orange juice, cereal, bread, soy beverages, and tofu products.  Nuts, such as almonds. Follow these recommended amounts for daily calcium intake:  Children, age 61-3: 700 mg.  Children, age 81-8: 1,000 mg.  Children, age 57-13: 1,300 mg.  Teens, age 819-18: 1,300 mg.  Adults, age 811-50: 1,000 mg.  Adults, age 83-70: ? Men: 1,000 mg. ? Women: 1,200 mg.  Adults, age 57 or older: 1,200 mg.  Pregnant and breastfeeding females: ? Teens: 1,300 mg. ? Adults: 1,000 mg. Get enough vitamin D Vitamin D is the most essential vitamin for bone health. It helps the body absorb calcium. Sunlight stimulates the skin to make vitamin D, so be sure to get enough sunlight. If you live in a cold climate or you do not get outside often, your health care provider may recommend that you take vitamin D supplements. Good sources of vitamin D in your diet include:  Egg yolks.  Saltwater fish.  Milk and cereal fortified with vitamin D. Follow these recommended amounts for daily vitamin D intake:  Children and teens, age 61-18: 600 international units.  Adults, age 28 or younger: 400-800 international units.  Adults, age 77 or older: 800-1,000 international units. Get other important nutrients Other nutrients that are important for bone health include:  Phosphorus. This mineral is found in meat, poultry, dairy foods, nuts, and legumes. The  recommended daily intake for adult men and adult women is 700 mg.  Magnesium. This mineral is found in seeds, nuts, dark green vegetables, and legumes. The recommended daily intake for adult men is 400-420 mg. For adult women, it is 310-320 mg.  Vitamin K. This vitamin is found in green leafy vegetables. The recommended daily  intake is 120 mg for adult men and 90 mg for adult women. What type of physical activity is best for building and maintaining healthy bones? Weight-bearing and strength-building activities are important for building and maintaining healthy bones. Weight-bearing activities cause muscles and bones to work against gravity. Strength-building activities increase the strength of the muscles that support bones. Weight-bearing and muscle-building activities include:  Walking and hiking.  Jogging and running.  Dancing.  Gym exercises.  Lifting weights.  Tennis and racquetball.  Climbing stairs.  Aerobics. Adults should get at least 30 minutes of moderate physical activity on most days. Children should get at least 60 minutes of moderate physical activity on most days. Ask your health care provider what type of exercise is best for you. How can I find out if my bone mass is low? Bone mass can be measured with an X-ray test called a bone mineral density (BMD) test. This test is recommended for all women who are age 50 or older. It may also be recommended for:  Men who are age 52 or older.  People who are at risk for osteoporosis because of: ? Having bones that break easily. ? Having a long-term disease that weakens bones, such as kidney disease or rheumatoid arthritis. ? Having menopause earlier than normal. ? Taking medicine that weakens bones, such as steroids, thyroid hormones, or hormone treatment for breast cancer or prostate cancer. ? Smoking. ? Drinking three or more alcoholic drinks a day. If you find that you have a low bone mass, you may be able to prevent osteoporosis or further bone loss by changing your diet and lifestyle. Where can I find more information? For more information, check out the following websites:  [place]: AviationTales.fr  [employer] of Health: www.bones.SouthExposed.es  International Osteoporosis Foundation:  Administrator.iofbonehealth.org Summary  The aging process leads to an overall loss of bone mass in the body, which can increase the likelihood of broken bones and osteoporosis.  Eating a well-balanced diet with plenty of calcium and vitamin D will help to protect your bones.  Weight-bearing and strength-building activities are also important for building and maintaining strong bones.  Bone mass can be measured with an X-ray test called a bone mineral density (BMD) test. This information is not intended to replace advice given to you by your health care provider. Make sure you discuss any questions you have with your health care provider. Document Released: 02/14/2004 Document Revised: 12/21/2017 Document Reviewed: 12/21/2017 Elsevier Patient Education  2020 [employer].

## 2019-10-06 ENCOUNTER — Telehealth: Payer: Self-pay | Admitting: Adult Health

## 2019-10-06 NOTE — Telephone Encounter (Signed)
I could not reach patient regarding schedule I will mail

## 2020-01-01 ENCOUNTER — Ambulatory Visit: Payer: Medicare PPO | Attending: Internal Medicine

## 2020-01-01 DIAGNOSIS — Z23 Encounter for immunization: Secondary | ICD-10-CM | POA: Insufficient documentation

## 2020-01-01 NOTE — Progress Notes (Signed)
   Covid-19 Vaccination Clinic  Name:  Victoria Rice    MRN: [id number] DOB: 04/12/1931  01/01/2020  Victoria Rice was observed post Covid-19 immunization for 15 minutes without incidence. She was provided with Vaccine Information Sheet and instruction to access the V-Safe system.   Victoria Rice was instructed to call 911 with any severe reactions post vaccine: [name] Difficulty breathing  . Swelling of your face and throat  . A fast heartbeat  . A bad rash all over your body  . Dizziness and weakness    Immunizations Administered    Name Date Dose VIS Date Route   Pfizer COVID-19 Vaccine 01/01/2020  1:19 PM 0.3 mL 11/18/2019 Intramuscular   Manufacturer: [place]   Lot: [id number]   [place]: [id number]

## 2020-01-04 ENCOUNTER — Ambulatory Visit: Payer: Medicare Other | Admitting: Critical Care Medicine

## 2020-01-23 ENCOUNTER — Ambulatory Visit: Payer: Medicare PPO | Attending: Internal Medicine

## 2020-01-23 DIAGNOSIS — Z23 Encounter for immunization: Secondary | ICD-10-CM | POA: Insufficient documentation

## 2020-01-23 NOTE — Progress Notes (Signed)
   Covid-19 Vaccination Clinic  Name:  Victoria Rice    MRN: [id number] DOB: 1930/12/31  01/23/2020  Victoria Rice was observed post Covid-19 immunization for 15 minutes without incidence. She was provided with Vaccine Information Sheet and instruction to access the V-Safe system.   Victoria Rice was instructed to call 911 with any severe reactions post vaccine: [name] Difficulty breathing  . Swelling of your face and throat  . A fast heartbeat  . A bad rash all over your body  . Dizziness and weakness    Immunizations Administered    Name Date Dose VIS Date Route   Pfizer COVID-19 Vaccine 01/23/2020 12:04 PM 0.3 mL 11/18/2019 Intramuscular   Manufacturer: [place]   Lot: [id number]   [place]: [id number]

## 2020-06-18 ENCOUNTER — Other Ambulatory Visit: Payer: Self-pay | Admitting: Hematology and Oncology

## 2020-06-18 DIAGNOSIS — Z9889 Other specified postprocedural states: Secondary | ICD-10-CM

## 2020-06-28 ENCOUNTER — Telehealth: Payer: Self-pay | Admitting: Adult Health

## 2020-06-28 NOTE — Telephone Encounter (Signed)
Rescheduled appointment per 7/22 provider message. Left message on patient's voicemail with updated appointment date and time.

## 2020-07-31 ENCOUNTER — Other Ambulatory Visit: Payer: Self-pay

## 2020-07-31 ENCOUNTER — Ambulatory Visit
Admission: RE | Admit: 2020-07-31 | Discharge: 2020-07-31 | Disposition: A | Discharge status: Home / Self Care | Payer: Medicare PPO | Source: Ambulatory Visit | Attending: Hematology and Oncology | Admitting: Hematology and Oncology

## 2020-07-31 DIAGNOSIS — Z9889 Other specified postprocedural states: Secondary | ICD-10-CM

## 2020-07-31 DIAGNOSIS — R928 Other abnormal and inconclusive findings on diagnostic imaging of breast: Secondary | ICD-10-CM | POA: Diagnosis not present

## 2020-07-31 DIAGNOSIS — Z853 Personal history of malignant neoplasm of breast: Secondary | ICD-10-CM | POA: Diagnosis not present

## 2020-08-06 ENCOUNTER — Other Ambulatory Visit: Payer: Self-pay | Admitting: Thoracic Surgery (Cardiothoracic Vascular Surgery)

## 2020-08-06 DIAGNOSIS — C3490 Malignant neoplasm of unspecified part of unspecified bronchus or lung: Secondary | ICD-10-CM

## 2020-08-29 ENCOUNTER — Ambulatory Visit
Admission: RE | Admit: 2020-08-29 | Discharge: 2020-08-29 | Disposition: A | Discharge status: Home / Self Care | Payer: Medicare PPO | Source: Ambulatory Visit | Attending: Thoracic Surgery (Cardiothoracic Vascular Surgery) | Admitting: Thoracic Surgery (Cardiothoracic Vascular Surgery)

## 2020-08-29 DIAGNOSIS — Q278 Other specified congenital malformations of peripheral vascular system: Secondary | ICD-10-CM | POA: Diagnosis not present

## 2020-08-29 DIAGNOSIS — I700 Atherosclerosis of aorta: Secondary | ICD-10-CM | POA: Diagnosis not present

## 2020-08-29 DIAGNOSIS — I2510 Atherosclerotic heart disease of native coronary artery without angina pectoris: Secondary | ICD-10-CM | POA: Diagnosis not present

## 2020-08-29 DIAGNOSIS — C3490 Malignant neoplasm of unspecified part of unspecified bronchus or lung: Secondary | ICD-10-CM

## 2020-08-29 DIAGNOSIS — J432 Centrilobular emphysema: Secondary | ICD-10-CM | POA: Diagnosis not present

## 2020-09-04 ENCOUNTER — Other Ambulatory Visit: Payer: Self-pay

## 2020-09-04 ENCOUNTER — Encounter: Payer: Self-pay | Admitting: Thoracic Surgery (Cardiothoracic Vascular Surgery)

## 2020-09-04 ENCOUNTER — Ambulatory Visit: Payer: Medicare PPO | Admitting: Thoracic Surgery (Cardiothoracic Vascular Surgery)

## 2020-09-04 VITALS — BP 160/80 | HR 76 | Temp 97.9°F | Resp 20 | Ht 60.0 in | Wt 110.0 lb

## 2020-09-04 DIAGNOSIS — C7A8 Other malignant neuroendocrine tumors: Secondary | ICD-10-CM | POA: Diagnosis not present

## 2020-09-04 NOTE — Progress Notes (Signed)
[address]       [place] [address]             [phone]     HPI: Victoria Rice returns for follow-up of a carcinoid tumor  Victoria Rice is an 86 year old woman with a history of remote tobacco abuse, breast cancer, COPD, and a left lower lobe carcinoid tumor.  In 2017 she was having a metastatic work-up for breast cancer.  She was found to have a lung nodule.  It was mildly hypermetabolic on PET/CT.  Navigational bronchoscopy showed a low-grade endocrine tumor.  She had severe COPD with an FEV1 of 0.4.  Given the low-grade nature of the lesion it was elected to follow this radiographically.  Respiratory status has been stable.  She has good days and bad days.  Particularly on hot and humid days she has some trouble with wheezing and shortness of breath.  She did get the Covid vaccination.  She has not yet had a booster shot.  Past Medical History:  Diagnosis Date  . Breast cancer ([place])    lumpectomy, no chemo or XRT; 1 year of anastrazole  . Cataract   . COPD, severe ([place]) 07/31/2016  . Family history of adverse reaction to anesthesia    " my sisiter was unable to move for a while with the spinal anesthesia."  . Hypothyroidism    nodule   . Neuroendocrine carcinoma of lung ([place]) 10/26/2016  . Nodule of left lung    lower lobe  . Pneumonia    as an infant  . Sebaceous cyst of labia 08/18/2018  . Shortness of breath dyspnea    recently increased   . Skin cancer    nose- tx. [facility]   . Thyroid nodule   . Vaginal delivery    x2 /w  one being breech    Current Outpatient Medications  Medication Sig Dispense Refill  . cholecalciferol (VITAMIN D) 1000 units tablet Take 1,000 Units by mouth daily.    [name] levothyroxine (SYNTHROID, LEVOTHROID) 50 MCG tablet Take 50 mcg by mouth daily before breakfast.    . naproxen sodium (ANAPROX) 220 MG tablet Take 220 mg by mouth 2 (two) times daily as needed (for pain.).     [name] Ipratropium-Albuterol  (COMBIVENT RESPIMAT) 20-100 MCG/ACT AERS respimat Inhale 1 puff into the lungs every 6 (six) hours as needed for wheezing or shortness of breath. (Patient not taking: Reported on 09/04/2020) 4 g 11   No current facility-administered medications for this visit.    Physical Exam BP (!) 160/80   Pulse 76   Temp 97.9 F (36.6 C) (Skin)   Resp 20   Ht 5' (1.524 m)   Wt 110 lb (49.9 kg)   SpO2 96% Comment: RA  BMI 21.70 kg/m  86 year old woman in no acute distress Alert and oriented x3 with no focal deficits Lungs diminished breath sounds bilaterally, no wheezing Cardiac regular rate and rhythm  Diagnostic Tests: CT CHEST WITHOUT CONTRAST  TECHNIQUE: Multidetector CT imaging of the chest was performed following the standard protocol without IV contrast.  COMPARISON:  09/01/2019  FINDINGS: Cardiovascular: The heart size appears within normal limits. No pericardial effusion identified. Aortic atherosclerosis. Aberrant right subclavian artery. Coronary artery calcifications.  Mediastinum/Nodes: No enlarged axillary, supraclavicular, or mediastinal lymph nodes. Hilar lymph nodes are suboptimally evaluated due to lack of IV contrast. The trachea appears patent and is midline. Normal appearance of the esophagus.  Lungs/Pleura: Centrilobular emphysema.  No pleural effusion, airspace consolidation or atelectasis. Within the left lower lobe adjacent to the left heart border is a ovoid, solid nodule which measures 1.7 cm, image 98/8. This is compared with 1.6 cm (when remeasured) previously.  3 mm left upper lobe lung nodule is unchanged, image 78/8.  A  Additional, small lung nodules appear unchanged from previous exam.  Stable chronic appearing postinflammatory changes within the right middle lobe noted with mild bronchiectasis and areas of peripheral airway impaction. Unchanged from previous exam.  Upper Abdomen: Small hiatal hernia. Aortic  atherosclerosis. Gallstones. Unchanged appearance of left adrenal nodule, image 144/2.  Musculoskeletal: Scoliosis and degenerative disc disease noted. No acute or suspicious osseous findings.  IMPRESSION: 1. No significant interval change in the appearance of left lower lobe lung nodule compatible with the history of pulmonary carcinoid. 2. Other small lung nodules are unchanged from previous exam. 3. Coronary artery calcifications noted. 4. Aberrant right subclavian artery. 5. Gallstones. 6. Stable appearance of left adrenal nodule.  Aortic Atherosclerosis (ICD10-I70.0) and Emphysema (ICD10-J43.9).  I   Electronically Signed   By: Kerby Moors M.D.   On: 08/29/2020 13:12 I personally reviewed the CT images and concur with the findings noted above  Impression: Victoria Rice is an 86 year old woman with a history of remote tobacco abuse, COPD, breast cancer, and a left lower lobe carcinoid tumor.    Carcinoid tumor-low-grade by pathology, was found back in 2017.  It has been followed radiographically since then.  Minimal interval change.  No indication for intervention in light of severe COPD.  COPD-FEV1 0.47 in 2017.  Stable symptomatically.  Uses Combivent inhaler as needed  Plan: Return in 1 year with CT chest  I spent 10 minutes in review of records, images, and in consultation with Victoria Rice today. Melrose Nakayama, MD [facility] [phone]

## 2020-09-22 ENCOUNTER — Ambulatory Visit: Payer: Medicare PPO | Attending: Internal Medicine

## 2020-09-22 DIAGNOSIS — Z23 Encounter for immunization: Secondary | ICD-10-CM

## 2020-09-22 NOTE — Progress Notes (Signed)
   Covid-19 Vaccination Clinic  Name:  Victoria Rice    MRN: [id number] DOB: 1931-09-04  09/22/2020  Victoria Rice was observed post Covid-19 immunization for 15 minutes without incident. She was provided with Vaccine Information Sheet and instruction to access the V-Safe system.   Victoria Rice was instructed to call 911 with any severe reactions post vaccine: [name] Difficulty breathing  . Swelling of face and throat  . A fast heartbeat  . A bad rash all over body  . Dizziness and weakness

## 2020-10-30 ENCOUNTER — Encounter: Payer: Medicare Other | Admitting: Adult Health

## 2020-10-31 ENCOUNTER — Telehealth: Payer: Self-pay | Admitting: Adult Health

## 2020-10-31 ENCOUNTER — Encounter: Payer: Self-pay | Admitting: Adult Health

## 2020-10-31 ENCOUNTER — Other Ambulatory Visit: Payer: Self-pay

## 2020-10-31 ENCOUNTER — Inpatient Hospital Stay: Payer: Medicare PPO | Attending: Adult Health | Admitting: Adult Health

## 2020-10-31 VITALS — BP 164/90 | HR 84 | Temp 97.8°F | Resp 18 | Ht 60.0 in | Wt 109.7 lb

## 2020-10-31 DIAGNOSIS — J431 Panlobular emphysema: Secondary | ICD-10-CM

## 2020-10-31 DIAGNOSIS — Z85828 Personal history of other malignant neoplasm of skin: Secondary | ICD-10-CM | POA: Insufficient documentation

## 2020-10-31 DIAGNOSIS — C50412 Malignant neoplasm of upper-outer quadrant of left female breast: Secondary | ICD-10-CM | POA: Insufficient documentation

## 2020-10-31 DIAGNOSIS — E039 Hypothyroidism, unspecified: Secondary | ICD-10-CM | POA: Diagnosis not present

## 2020-10-31 DIAGNOSIS — J439 Emphysema, unspecified: Secondary | ICD-10-CM | POA: Diagnosis not present

## 2020-10-31 DIAGNOSIS — Z170 Estrogen receptor positive status [ER+]: Secondary | ICD-10-CM | POA: Insufficient documentation

## 2020-10-31 DIAGNOSIS — C7A8 Other malignant neuroendocrine tumors: Secondary | ICD-10-CM | POA: Diagnosis not present

## 2020-10-31 DIAGNOSIS — Z85118 Personal history of other malignant neoplasm of bronchus and lung: Secondary | ICD-10-CM | POA: Diagnosis not present

## 2020-10-31 DIAGNOSIS — I700 Atherosclerosis of aorta: Secondary | ICD-10-CM | POA: Diagnosis not present

## 2020-10-31 DIAGNOSIS — Z79811 Long term (current) use of aromatase inhibitors: Secondary | ICD-10-CM | POA: Insufficient documentation

## 2020-10-31 DIAGNOSIS — Z87891 Personal history of nicotine dependence: Secondary | ICD-10-CM | POA: Insufficient documentation

## 2020-10-31 NOTE — Progress Notes (Signed)
CLINIC:  Survivorship   REASON FOR VISIT:  Routine follow-up for history of breast cancer.   BRIEF ONCOLOGIC HISTORY:  Oncology History  Breast cancer of upper-outer quadrant of left female breast ([place])  05/20/2016 Initial Diagnosis   Left breast biopsy 2:30 position: IDC with DCIS, grade 1, ER 100%, PR 90%, Ki-67 5%, HER-2 negative ratio 1.33; is okay  1:30 position: 0.7 cm invasive lobular cancer, grade 1, ER/PR positive HER-2 negative Ki-67 10%, T1 N0 stage IA    06/09/2016 Breast MRI   Right breast: 1.3 x 0.5 x 0.8 cm linearly oriented mass extends posteriorly to the level of pectoralis muscle, no lymph nodes, 1.8 cm left lower lobe lung nodule   07/04/2016 PET scan   1.7 cm hypermetabolic nodule medial left lower lobe suspicious for lung cancer. Moderate emphysema, tiny subcentimeter pulmonary nodules too small to characterize   07/25/2016 Surgery   Right lumpectomy: DCIS 0.2 cm, margins negative, ER 95%, PR 70%; Tis N0 stage 0 Left lumpectomy: IDC with DCIS 0.8 cm ER 100%, PR 90%, HER-2 negative, Ki-67 5% and 0.5 cm ER 95%, PR 70%, HER-2 negative, Ki-67 10%, T1b NX stage I a multifocal    08/28/2016 - 09/24/2017 Anti-estrogen oral therapy   Anastrozole 1 mg daily stopped for adverse effects like joint pain   Neuroendocrine carcinoma of lung ([place])  08/25/2016 Initial Diagnosis   Low Grade Neuroendocrine carcinoma of lung ([place])      INTERVAL HISTORY:  Ms. Capes presents to the Survivorship Clinic today for routine follow-up for her history of breast cancer.  Overall, she reports feeling quite well.   She underwent a bilateral breast diagnostic mammogram on 07/31/2020 that demonstrated no mammographic evidence of malignancy and breast density category B.  She is followed by Dr Roxan Hockey for her neuroendocrine carcinoma of the lung.  She underwent CT chest on 08/29/2020 and it showed no change in left lower lung nodule, unchanged small lung nodules.  Incidentally it noted aortic  atherosclerosis and Emphysema.  She notes that she is feeling well.  She is as active as she can be considering her emphysema, and the COVID 19 pandemic.  She is up to date with her PCP visits and f/u with Dr. Roxan Hockey. She has no breast concerns or new issues today.      REVIEW OF SYSTEMS:  Review of Systems  Constitutional: Negative for appetite change, chills, fatigue, fever and unexpected weight change.  HENT:   Negative for hearing loss, lump/mass, nosebleeds, sore throat and trouble swallowing.   Eyes: Negative for eye problems and icterus.  Respiratory: Negative for chest tightness, cough and shortness of breath.   Cardiovascular: Negative for chest pain, leg swelling and palpitations.  Gastrointestinal: Negative for abdominal distention, abdominal pain, constipation, diarrhea, nausea and vomiting.  Endocrine: Negative for hot flashes.  Genitourinary: Negative for difficulty urinating.   Musculoskeletal: Negative for arthralgias.  Skin: Negative for itching and rash.  Neurological: Negative for dizziness, extremity weakness, headaches and numbness.  Hematological: Negative for adenopathy. Does not bruise/bleed easily.  Psychiatric/Behavioral: Negative for depression. The patient is not nervous/anxious.   Breast: Denies any new nodularity, masses, tenderness, nipple changes, or nipple discharge.       PAST MEDICAL/SURGICAL HISTORY:  Past Medical History:  Diagnosis Date  . Breast cancer ([place])    lumpectomy, no chemo or XRT; 1 year of anastrazole  . Cataract   . COPD, severe ([place]) 07/31/2016  . Family history of adverse reaction to anesthesia    "  my sisiter was unable to move for a while with the spinal anesthesia."  . Hypothyroidism    nodule   . Neuroendocrine carcinoma of lung ([place]) 10/26/2016  . Nodule of left lung    lower lobe  . Pneumonia    as an infant  . Sebaceous cyst of labia 08/18/2018  . Shortness of breath dyspnea    recently increased   . Skin  cancer    nose- tx. [facility]   . Thyroid nodule   . Vaginal delivery    x2 /w  one being breech   Past Surgical History:  Procedure Laterality Date  . BREAST LUMPECTOMY Right 1970's   R breast  . BREAST LUMPECTOMY Bilateral 07/23/2016  . BREAST LUMPECTOMY WITH NEEDLE LOCALIZATION Left 07/23/2016   Procedure: LEFT BREAST LUMPECTOMY WITH DOUBLE NEEDLE LOCALIZATION;  Surgeon: Fanny Skates, MD;  Location: [facility];  Service: General;  Laterality: Left;  . BREAST LUMPECTOMY WITH RADIOACTIVE SEED LOCALIZATION Right 07/23/2016   Procedure: RIGHT BREAST LUMPECTOMY WITH RADIOACTIVE SEED LOCALIZATION;  Surgeon: Fanny Skates, MD;  Location: [facility];  Service: General;  Laterality: Right;  . CATARACT EXTRACTION W/ INTRAOCULAR LENS IMPLANT     right eye  . EYE SURGERY Right    /w IOL- cataract removed   . FUDUCIAL PLACEMENT N/A 08/25/2016   Procedure: PLACEMENT OF FUDUCIAL;  Surgeon: Melrose Nakayama, MD;  Location: [facility];  Service: Thoracic;  Laterality: N/A;  . VARICOSE VEIN SURGERY Right    w/o stitches on R leg  . VIDEO BRONCHOSCOPY WITH ENDOBRONCHIAL NAVIGATION N/A 08/25/2016   Procedure: VIDEO BRONCHOSCOPY WITH ENDOBRONCHIAL NAVIGATION;  Surgeon: Melrose Nakayama, MD;  Location: [facility];  Service: Thoracic;  Laterality: N/A;     ALLERGIES:  Allergies  Allergen Reactions  . Actonel [Risedronate Sodium] Other (See Comments)    indigestion     CURRENT MEDICATIONS:  Outpatient Encounter Medications as of 10/31/2020  Medication Sig  . cholecalciferol (VITAMIN D) 1000 units tablet Take 1,000 Units by mouth daily.  . Ipratropium-Albuterol (COMBIVENT RESPIMAT) 20-100 MCG/ACT AERS respimat Inhale 1 puff into the lungs every 6 (six) hours as needed for wheezing or shortness of breath.  . levothyroxine (SYNTHROID, LEVOTHROID) 50 MCG tablet Take 50 mcg by mouth daily before breakfast.  . naproxen sodium (ANAPROX) 220 MG tablet Take 220 mg by mouth 2 (two) times daily as needed (for  pain.).    No facility-administered encounter medications on file as of 10/31/2020.     ONCOLOGIC FAMILY HISTORY:  Family History  Problem Relation Age of Onset  . Prostate cancer Brother   . Lung cancer Brother   . Cystic fibrosis Cousin     GENETIC COUNSELING/TESTING: Not at this tim  SOCIAL HISTORY:  Social History   Socioeconomic History  . Marital status: Widowed    Spouse name: Not on file  . Number of children: 2  . Years of education: Not on file  . Highest education level: Not on file  Occupational History  . Not on file  Tobacco Use  . Smoking status: Former Smoker    Packs/day: 0.75    Years: 20.00    Pack years: 15.00    Types: Cigarettes    Quit date: 05/08/1974    Years since quitting: 46.5  . Smokeless tobacco: Never Used  Vaping Use  . Vaping Use: Never used  Substance and Sexual Activity  . Alcohol use: Yes    Alcohol/week: 6.0 standard drinks    Types:  6 Glasses of wine per week    Comment: social  . Drug use: No  . Sexual activity: Never  Other Topics Concern  . Not on file  Social History Narrative  . Not on file   Social Determinants of Health   Financial Resource Strain:   . Difficulty of Paying Living Expenses: Not on file  Food Insecurity:   . Worried About Charity fundraiser in the Last Year: Not on file  . Ran Out of Food in the Last Year: Not on file  Transportation Needs:   . Lack of Transportation (Medical): Not on file  . Lack of Transportation (Non-Medical): Not on file  Physical Activity:   . Days of Exercise per Week: Not on file  . Minutes of Exercise per Session: Not on file  Stress:   . Feeling of Stress : Not on file  Social Connections:   . Frequency of Communication with Friends and Family: Not on file  . Frequency of Social Gatherings with Friends and Family: Not on file  . Attends Religious Services: Not on file  . Active Member of Clubs or Organizations: Not on file  . Attends Archivist  Meetings: Not on file  . Marital Status: Not on file  Intimate Partner Violence:   . Fear of Current or Ex-Partner: Not on file  . Emotionally Abused: Not on file  . Physically Abused: Not on file  . Sexually Abused: Not on file      PHYSICAL EXAMINATION:  Vital Signs: Vitals:   10/31/20 1100 10/31/20 1130  BP: (!) 177/71 (!) 164/90  Pulse: 84   Resp: 18   Temp: 97.8 F (36.6 C)   SpO2: 94%    Filed Weights   10/31/20 1100  Weight: 109 lb 11.2 oz (49.8 kg)   General: Well-nourished, well-appearing female in no acute distress.  Unaccompanied today.   HEENT: Head is normocephalic.  Pupils equal and reactive to light. Conjunctivae clear without exudate.  Sclerae anicteric. Mask in place.  Lymph: No cervical, supraclavicular, or infraclavicular lymphadenopathy noted on palpation.  Cardiovascular: Regular rate and rhythm.[name] Respiratory: Clear to auscultation bilaterally. Chest expansion symmetric; breathing non-labored.  Breast Exam:  -Left breast: No appreciable masses on palpation. No skin redness, thickening, or peau d'orange appearance; no nipple retraction or nipple discharge; mild distortion in symmetry at previous lumpectomy site well healed scar without erythema or nodularity.  -Right breast: No appreciable masses on palpation. No skin redness, thickening, or peau d'orange appearance; no nipple retraction or nipple discharge;. -Axilla: No axillary adenopathy bilaterally.  GI: Abdomen soft and round; non-tender, non-distended. Bowel sounds normoactive. No hepatosplenomegaly.   GU: Deferred.  Neuro: No focal deficits. Steady gait.  Psych: Mood and affect normal and appropriate for situation.  MSK: No focal spinal tenderness to palpation, full range of motion in bilateral upper extremities Extremities: No edema. Skin: Warm and dry.  LABORATORY DATA:  None for this visit   DIAGNOSTIC IMAGING:  Most recent mammogram:  CLINICAL DATA:  86 year old female with history of  bilateral breast cancer in 2017 status post lumpectomy. Patient presents for annual exam. No new problems today.  EXAM: DIGITAL DIAGNOSTIC BILATERAL MAMMOGRAM WITH TOMO AND CAD  COMPARISON:  Previous exam(s).  ACR Breast Density Category b: There are scattered areas of fibroglandular density.  FINDINGS: Right breast: Spot 2D magnification views were performed in addition to standard views. There are stable postsurgical changes. No suspicious mass, distortion, or microcalcifications are identified to suggest presence  of malignancy.  Left breast: Spot 2D magnification views were performed in addition to standard views. There are stable postsurgical changes. No suspicious mass, distortion, or microcalcifications are identified to suggest presence of malignancy.  Mammographic images were processed with CAD.  IMPRESSION: No mammographic evidence of malignancy bilaterally.  RECOMMENDATION: Diagnostic mammogram is suggested in 1 year. (Code:DM-B-01Y)  I have discussed the findings and recommendations with the patient. If applicable, a reminder letter will be sent to the patient regarding the next appointment.  BI-RADS CATEGORY  2: Benign.   Electronically Signed   By: Audie Pinto M.D.   On: 07/31/2020 12:07    ASSESSMENT AND PLAN:  Ms.. Rice is a pleasant 86 y.o. female with history of Stage IA left breast invasive ductal carcinoma, ER+/PR+/HER2-, diagnosed in 05/2016, treated with lumpectomy and anti estrogen therapy with Anastrozole x 1 year (unable to tolerate and completed in 09/2017).  She presents to the Survivorship Clinic for surveillance and routine follow-up.   1. History of breast cancer:  Ms. Wallenstein is currently clinically and radiographically without evidence of disease or recurrence of breast cancer. She will be due for mammogram in 07/2021; orders placed today.  I reviewed the recommendation for annual diagnostic mammograms through 2022.  She  will return in 1 year for continued surveillance and monitoring.  I reviewed her overall health recommendations  I encouraged her to call me with any questions or concerns before her next visit at the cancer center, and I would be happy to see her sooner, if needed.    2. Neuroendocrine carcinoma of lung: no sign of recurrence, most recent CT in 08/2021 stable.  She will continue to f/u with Dr. Roxan Hockey about this.    3. Aortic Atherosclerosis: noted on recent CT chest, following with PCP regularly.    4. Emphysema: This is stable and only mildly limits her activity.  She will continue to f/u with her PCP about this.  5. Bone health:  Given Ms. Schley's age, history of breast cancer, and her previous anti-estrogen therapy with Anastrozole, she is at risk for bone demineralization. She does not want to undergo bone density testing at this time.  She was given education on specific food and activities to promote bone health.  6. Cancer screening:  Due to Ms. Aylesworth's history and her age, she should receive screening for skin cancers, colon cancer. She was encouraged to follow-up with her PCP for appropriate cancer screenings.   7. Health maintenance and wellness promotion: Ms. Cavendish was encouraged to consume 5-7 servings of fruits and vegetables per day. She was also encouraged to engage in moderate to vigorous exercise for 30 minutes per day most days of the week. She was instructed to limit her alcohol consumption and continue to abstain from tobacco use.      Dispo:  -Return to cancer center in one year for LTS f/u -Mammogram in 07/2021  Total encounter time: 20 minutes*  Wilber Bihari, NP 11/02/20 9:44 AM Medical Oncology and Hematology [facility] [place], [place] [address] Tel. [phone]    Fax. [phone]  *Total Encounter Time as defined by the Centers for Medicare and Medicaid Services includes, in addition to the face-to-face time of a  patient visit (documented in the note above) non-face-to-face time: obtaining and reviewing outside history, ordering and reviewing medications, tests or procedures, care coordination (communications with other health care professionals or caregivers) and documentation in the medical record.   Note: PRIMARY CARE PROVIDER  Leighton Ruff, [place] [phone]

## 2020-10-31 NOTE — Telephone Encounter (Signed)
Scheduled appts per 11/24 los. Gave pt a print out of appt calendar.

## 2020-10-31 NOTE — Patient Instructions (Signed)

## 2021-01-03 DIAGNOSIS — E559 Vitamin D deficiency, unspecified: Secondary | ICD-10-CM | POA: Diagnosis not present

## 2021-01-03 DIAGNOSIS — R000 Tachycardia, unspecified: Secondary | ICD-10-CM | POA: Diagnosis not present

## 2021-01-03 DIAGNOSIS — E039 Hypothyroidism, unspecified: Secondary | ICD-10-CM | POA: Diagnosis not present

## 2021-01-03 DIAGNOSIS — R030 Elevated blood-pressure reading, without diagnosis of hypertension: Secondary | ICD-10-CM | POA: Diagnosis not present

## 2021-01-07 ENCOUNTER — Telehealth: Payer: Self-pay

## 2021-01-07 NOTE — Telephone Encounter (Signed)
ekg on file 

## 2021-01-10 ENCOUNTER — Ambulatory Visit: Payer: Medicare PPO | Admitting: Cardiology

## 2021-03-07 DIAGNOSIS — E039 Hypothyroidism, unspecified: Secondary | ICD-10-CM | POA: Diagnosis not present

## 2021-03-07 DIAGNOSIS — I10 Essential (primary) hypertension: Secondary | ICD-10-CM | POA: Diagnosis not present

## 2021-03-07 DIAGNOSIS — J449 Chronic obstructive pulmonary disease, unspecified: Secondary | ICD-10-CM | POA: Diagnosis not present

## 2021-03-07 DIAGNOSIS — E041 Nontoxic single thyroid nodule: Secondary | ICD-10-CM | POA: Diagnosis not present

## 2021-03-07 DIAGNOSIS — M810 Age-related osteoporosis without current pathological fracture: Secondary | ICD-10-CM | POA: Diagnosis not present

## 2021-03-07 DIAGNOSIS — D3A090 Benign carcinoid tumor of the bronchus and lung: Secondary | ICD-10-CM | POA: Diagnosis not present

## 2021-03-07 DIAGNOSIS — E559 Vitamin D deficiency, unspecified: Secondary | ICD-10-CM | POA: Diagnosis not present

## 2021-03-07 DIAGNOSIS — J432 Centrilobular emphysema: Secondary | ICD-10-CM | POA: Diagnosis not present

## 2021-03-21 DIAGNOSIS — J432 Centrilobular emphysema: Secondary | ICD-10-CM | POA: Diagnosis not present

## 2021-03-21 DIAGNOSIS — E559 Vitamin D deficiency, unspecified: Secondary | ICD-10-CM | POA: Diagnosis not present

## 2021-03-21 DIAGNOSIS — E039 Hypothyroidism, unspecified: Secondary | ICD-10-CM | POA: Diagnosis not present

## 2021-03-21 DIAGNOSIS — M810 Age-related osteoporosis without current pathological fracture: Secondary | ICD-10-CM | POA: Diagnosis not present

## 2021-03-21 DIAGNOSIS — I10 Essential (primary) hypertension: Secondary | ICD-10-CM | POA: Diagnosis not present

## 2021-03-21 DIAGNOSIS — D3A090 Benign carcinoid tumor of the bronchus and lung: Secondary | ICD-10-CM | POA: Diagnosis not present

## 2021-03-21 DIAGNOSIS — E041 Nontoxic single thyroid nodule: Secondary | ICD-10-CM | POA: Diagnosis not present

## 2021-03-21 DIAGNOSIS — J449 Chronic obstructive pulmonary disease, unspecified: Secondary | ICD-10-CM | POA: Diagnosis not present

## 2021-04-08 DIAGNOSIS — L309 Dermatitis, unspecified: Secondary | ICD-10-CM | POA: Diagnosis not present

## 2021-04-08 DIAGNOSIS — L820 Inflamed seborrheic keratosis: Secondary | ICD-10-CM | POA: Diagnosis not present

## 2021-04-08 DIAGNOSIS — D485 Neoplasm of uncertain behavior of skin: Secondary | ICD-10-CM | POA: Diagnosis not present

## 2021-06-13 DIAGNOSIS — Z1389 Encounter for screening for other disorder: Secondary | ICD-10-CM | POA: Diagnosis not present

## 2021-06-13 DIAGNOSIS — Z0000 Encounter for general adult medical examination without abnormal findings: Secondary | ICD-10-CM | POA: Diagnosis not present

## 2021-06-18 DIAGNOSIS — D3A090 Benign carcinoid tumor of the bronchus and lung: Secondary | ICD-10-CM | POA: Diagnosis not present

## 2021-06-18 DIAGNOSIS — I10 Essential (primary) hypertension: Secondary | ICD-10-CM | POA: Diagnosis not present

## 2021-06-18 DIAGNOSIS — J449 Chronic obstructive pulmonary disease, unspecified: Secondary | ICD-10-CM | POA: Diagnosis not present

## 2021-06-18 DIAGNOSIS — J432 Centrilobular emphysema: Secondary | ICD-10-CM | POA: Diagnosis not present

## 2021-06-18 DIAGNOSIS — E559 Vitamin D deficiency, unspecified: Secondary | ICD-10-CM | POA: Diagnosis not present

## 2021-06-18 DIAGNOSIS — E041 Nontoxic single thyroid nodule: Secondary | ICD-10-CM | POA: Diagnosis not present

## 2021-06-18 DIAGNOSIS — M810 Age-related osteoporosis without current pathological fracture: Secondary | ICD-10-CM | POA: Diagnosis not present

## 2021-06-18 DIAGNOSIS — R0981 Nasal congestion: Secondary | ICD-10-CM | POA: Diagnosis not present

## 2021-06-18 DIAGNOSIS — E039 Hypothyroidism, unspecified: Secondary | ICD-10-CM | POA: Diagnosis not present

## 2021-06-24 ENCOUNTER — Other Ambulatory Visit: Payer: Self-pay | Admitting: Hematology and Oncology

## 2021-06-24 DIAGNOSIS — Z1231 Encounter for screening mammogram for malignant neoplasm of breast: Secondary | ICD-10-CM

## 2021-06-26 DIAGNOSIS — E875 Hyperkalemia: Secondary | ICD-10-CM | POA: Diagnosis not present

## 2021-06-26 DIAGNOSIS — R899 Unspecified abnormal finding in specimens from other organs, systems and tissues: Secondary | ICD-10-CM | POA: Diagnosis not present

## 2021-07-09 ENCOUNTER — Other Ambulatory Visit: Payer: Self-pay | Admitting: *Deleted

## 2021-07-09 DIAGNOSIS — D3A090 Benign carcinoid tumor of the bronchus and lung: Secondary | ICD-10-CM

## 2021-08-06 DIAGNOSIS — H43391 Other vitreous opacities, right eye: Secondary | ICD-10-CM | POA: Diagnosis not present

## 2021-08-06 DIAGNOSIS — H04123 Dry eye syndrome of bilateral lacrimal glands: Secondary | ICD-10-CM | POA: Diagnosis not present

## 2021-08-06 DIAGNOSIS — H5703 Miosis: Secondary | ICD-10-CM | POA: Diagnosis not present

## 2021-08-06 DIAGNOSIS — H2512 Age-related nuclear cataract, left eye: Secondary | ICD-10-CM | POA: Diagnosis not present

## 2021-08-06 DIAGNOSIS — Z961 Presence of intraocular lens: Secondary | ICD-10-CM | POA: Diagnosis not present

## 2021-08-16 ENCOUNTER — Other Ambulatory Visit: Payer: Self-pay

## 2021-08-16 ENCOUNTER — Ambulatory Visit
Admission: RE | Admit: 2021-08-16 | Discharge: 2021-08-16 | Disposition: A | Discharge status: Home / Self Care | Payer: Medicare PPO | Source: Ambulatory Visit | Attending: Hematology and Oncology | Admitting: Hematology and Oncology

## 2021-08-16 DIAGNOSIS — Z1231 Encounter for screening mammogram for malignant neoplasm of breast: Secondary | ICD-10-CM | POA: Diagnosis not present

## 2021-08-22 DIAGNOSIS — E039 Hypothyroidism, unspecified: Secondary | ICD-10-CM | POA: Diagnosis not present

## 2021-08-22 DIAGNOSIS — R600 Localized edema: Secondary | ICD-10-CM | POA: Diagnosis not present

## 2021-08-22 DIAGNOSIS — D3A090 Benign carcinoid tumor of the bronchus and lung: Secondary | ICD-10-CM | POA: Diagnosis not present

## 2021-08-22 DIAGNOSIS — I872 Venous insufficiency (chronic) (peripheral): Secondary | ICD-10-CM | POA: Diagnosis not present

## 2021-08-22 DIAGNOSIS — J449 Chronic obstructive pulmonary disease, unspecified: Secondary | ICD-10-CM | POA: Diagnosis not present

## 2021-08-22 DIAGNOSIS — Z23 Encounter for immunization: Secondary | ICD-10-CM | POA: Diagnosis not present

## 2021-08-22 DIAGNOSIS — I10 Essential (primary) hypertension: Secondary | ICD-10-CM | POA: Diagnosis not present

## 2021-08-22 DIAGNOSIS — Z6820 Body mass index (BMI) 20.0-20.9, adult: Secondary | ICD-10-CM | POA: Diagnosis not present

## 2021-09-10 ENCOUNTER — Ambulatory Visit: Payer: Medicare PPO | Admitting: Thoracic Surgery (Cardiothoracic Vascular Surgery)

## 2021-09-10 ENCOUNTER — Ambulatory Visit
Admission: RE | Admit: 2021-09-10 | Discharge: 2021-09-10 | Disposition: A | Discharge status: Home / Self Care | Payer: Medicare PPO | Source: Ambulatory Visit | Attending: Thoracic Surgery (Cardiothoracic Vascular Surgery) | Admitting: Thoracic Surgery (Cardiothoracic Vascular Surgery)

## 2021-09-10 ENCOUNTER — Other Ambulatory Visit: Payer: Self-pay

## 2021-09-10 ENCOUNTER — Encounter: Payer: Self-pay | Admitting: Thoracic Surgery (Cardiothoracic Vascular Surgery)

## 2021-09-10 VITALS — BP 148/78 | HR 84 | Resp 20 | Ht 60.0 in | Wt 105.2 lb

## 2021-09-10 DIAGNOSIS — R911 Solitary pulmonary nodule: Secondary | ICD-10-CM | POA: Diagnosis not present

## 2021-09-10 DIAGNOSIS — D3A090 Benign carcinoid tumor of the bronchus and lung: Secondary | ICD-10-CM

## 2021-09-10 DIAGNOSIS — I700 Atherosclerosis of aorta: Secondary | ICD-10-CM | POA: Diagnosis not present

## 2021-09-10 DIAGNOSIS — R918 Other nonspecific abnormal finding of lung field: Secondary | ICD-10-CM | POA: Diagnosis not present

## 2021-09-10 DIAGNOSIS — J479 Bronchiectasis, uncomplicated: Secondary | ICD-10-CM | POA: Diagnosis not present

## 2021-09-10 NOTE — Progress Notes (Signed)
[address]       [place] [address]             [phone]     HPI: Victoria Rice returns for follow-up of a carcinoid tumor of the left lower lobe.  Victoria Rice is an 87 year old woman with a history of remote tobacco abuse, COPD, breast cancer, and a left lower lobe carcinoid tumor.  She was found to have a left lower lobe lung nodule on a metastatic work-up in 2017.  It was mildly hypermetabolic on PET/CT.  Navigational bronchoscopy was performed and biopsy showed a low-grade endocrine tumor.  She had severe COPD with an FEV1 of 0.47 and was not a candidate for surgical resection.  She has been followed radiographically ever since.  She has not had any new major health issues over the past year.  She still has respiratory issues, which are stable.  She is not having any flushing or wheezing.  No chest pain, pressure, or tightness.  Past Medical History:  Diagnosis Date   Breast cancer ([place])    lumpectomy, no chemo or XRT; 1 year of anastrazole   Cataract    COPD, severe ([place]) 07/31/2016   Family history of adverse reaction to anesthesia    " my sisiter was unable to move for a while with the spinal anesthesia."   Hypothyroidism    nodule    Neuroendocrine carcinoma of lung ([facility]) 10/26/2016   Nodule of left lung    lower lobe   Pneumonia    as an infant   Sebaceous cyst of labia 08/18/2018   Shortness of breath dyspnea    recently increased    Skin cancer    nose- tx. with [facility]    Thyroid nodule    Vaginal delivery    x2 /w  one being breech    Current Outpatient Medications  Medication Sig Dispense Refill   AMLODIPINE BENZOATE PO Take 2.5 mg by mouth.     cholecalciferol (VITAMIN D) 1000 units tablet Take 1,000 Units by mouth daily.     furosemide (LASIX) 40 MG tablet Take 40 mg by mouth. Stopping due to leg cramps     levothyroxine (SYNTHROID, LEVOTHROID) 50 MCG tablet Take 50 mcg by mouth daily before breakfast.     naproxen sodium  (ANAPROX) 220 MG tablet Take 220 mg by mouth 2 (two) times daily as needed (for pain.).      Ipratropium-Albuterol (COMBIVENT RESPIMAT) 20-100 MCG/ACT AERS respimat Inhale 1 puff into the lungs every 6 (six) hours as needed for wheezing or shortness of breath. (Patient not taking: Reported on 09/10/2021) 4 g 11   No current facility-administered medications for this visit.    Physical Exam BP (!) 148/78 (BP Location: Left Arm, Patient Position: Sitting, Cuff Size: Normal)   Pulse 84   Resp 20   Ht 5' (1.524 m)   Wt 105 lb 3.2 oz (47.7 kg)   SpO2 91%   BMI 20.67 kg/m  87 year old woman in no acute distress Alert and oriented x3 with no focal deficits No cervical or supraclavicular adenopathy Lungs diminished breath sounds bilaterally with no rales or wheezing Cardiac regular rate and rhythm  Diagnostic Tests: CT CHEST WITHOUT CONTRAST   TECHNIQUE: Multidetector CT imaging of the chest was performed following the standard protocol without IV contrast.   COMPARISON:  Chest CT 08/29/2020.   FINDINGS: Cardiovascular: Heart size is normal. There is no significant pericardial fluid, thickening or  pericardial calcification. There is aortic atherosclerosis, as well as atherosclerosis of the great vessels of the mediastinum and the coronary arteries, including calcified atherosclerotic plaque in the left anterior descending, left circumflex and right coronary arteries. Mild calcification of the aortic valve. Aberrant right subclavian artery (normal anatomical variant) incidentally noted   Mediastinum/Nodes: No pathologically enlarged mediastinal or hilar lymph nodes. Please note that accurate exclusion of hilar adenopathy is limited on noncontrast CT scans. Esophagus is unremarkable in appearance. No axillary lymphadenopathy.   Lungs/Pleura: Again noted is an ovoid shaped smoothly marginated macrolobulated nodule in the anterior aspect of the left lower lobe (axial image 102 of  series 8), which currently measures 1.9 x 1.0 cm (previously 1.7 x 0.9 cm on 08/29/2020). Multiple other small 2-4 mm pulmonary nodules scattered throughout the lungs bilaterally, stable in size and number compared to the prior study, favored to be benign. No other larger more suspicious appearing pulmonary nodules or masses are noted. No acute consolidative airspace disease. No pleural effusions. Scattered areas of cylindrical and varicose bronchiectasis with some associated thickening of the peribronchovascular interstitium and regional architectural distortion, most severe in the right middle lobe and inferior segment of the lingula, most compatible with areas of chronic post infectious or inflammatory scarring.   Upper Abdomen: Aortic atherosclerosis. Multiple tiny calcified gallstones lying dependently in the visualized portions of the gallbladder.   Musculoskeletal: There are no aggressive appearing lytic or blastic lesions noted in the visualized portions of the skeleton.   IMPRESSION: 1. Similar size and appearance of smoothly marginated macrolobulated left lower lobe pulmonary nodule which currently measures 1.9 x 1.0 cm, compatible with reported pulmonary carcinoid. 2. Other smaller 2-4 mm pulmonary nodules are stable in size and number compared to the prior study. 3. Chronic areas of post post infectious/inflammatory scarring, similar to the prior study, as above. 4. Aortic atherosclerosis, in addition to 3 vessel coronary artery disease. 5. There are calcifications of the aortic valve. Echocardiographic correlation for evaluation of potential valvular dysfunction may be warranted if clinically indicated. 6. Cholelithiasis.   Aortic Atherosclerosis (ICD10-I70.0).     Electronically Signed   By: Vinnie Langton M.D.   On: 09/10/2021 09:50 I personally reviewed the CT images.  There has been minimal if any change in her left lower lobe lung nodule.  Multiple other  smaller nodules unchanged.  Chronic changes of COPD.  Impression: Victoria Rice is an 87 year old woman with a history of remote tobacco abuse, COPD, breast cancer, and a left lower lobe carcinoid tumor.  She has remote history of tobacco abuse but quit in 1975.  She was found to have a left lower lobe lung nodule on a CT of the chest back in 2017.  Biopsy showed a low-grade carcinoid tumor.  She was not a candidate for surgical resection and has been followed radiographically.  Low-grade carcinoid tumor-has been stable for 5 years.  She has multiple other small lung nodules that are stable as well.  She is not a candidate for surgical resection.  We discussed discontinuing follow-up at this point since the nodule is minimally if any changed over the last 5 years.  She does not want to continue to follow this nodule.  Aortic valve calcifications-she had an echocardiogram in 2020 which did not show any aortic valve pathology.  Plan: Follow-up with Dr. Drema Dallas I will be happy to see Mrs. Venezia back anytime in the future if I can be of any further assistance with her care  Revonda Standard  Roxan Hockey, MD [facility] [phone]

## 2021-10-23 DIAGNOSIS — I10 Essential (primary) hypertension: Secondary | ICD-10-CM | POA: Diagnosis not present

## 2021-10-23 DIAGNOSIS — Z6821 Body mass index (BMI) 21.0-21.9, adult: Secondary | ICD-10-CM | POA: Diagnosis not present

## 2021-10-23 DIAGNOSIS — J449 Chronic obstructive pulmonary disease, unspecified: Secondary | ICD-10-CM | POA: Diagnosis not present

## 2021-10-23 DIAGNOSIS — R0602 Shortness of breath: Secondary | ICD-10-CM | POA: Diagnosis not present

## 2021-11-05 ENCOUNTER — Other Ambulatory Visit: Payer: Self-pay

## 2021-11-05 ENCOUNTER — Inpatient Hospital Stay: Payer: Medicare PPO | Attending: Adult Health | Admitting: Adult Health

## 2021-11-05 ENCOUNTER — Telehealth: Payer: Self-pay | Admitting: Critical Care Medicine

## 2021-11-05 ENCOUNTER — Ambulatory Visit (HOSPITAL_COMMUNITY)
Admission: RE | Admit: 2021-11-05 | Discharge: 2021-11-05 | Disposition: A | Discharge status: Home / Self Care | Payer: Medicare PPO | Source: Ambulatory Visit | Attending: Adult Health | Admitting: Adult Health

## 2021-11-05 ENCOUNTER — Telehealth: Payer: Self-pay

## 2021-11-05 VITALS — BP 160/63 | HR 92 | Temp 98.1°F | Resp 18 | Ht 60.0 in | Wt 109.0 lb

## 2021-11-05 DIAGNOSIS — Z801 Family history of malignant neoplasm of trachea, bronchus and lung: Secondary | ICD-10-CM | POA: Diagnosis not present

## 2021-11-05 DIAGNOSIS — Z87891 Personal history of nicotine dependence: Secondary | ICD-10-CM | POA: Insufficient documentation

## 2021-11-05 DIAGNOSIS — N6310 Unspecified lump in the right breast, unspecified quadrant: Secondary | ICD-10-CM | POA: Diagnosis not present

## 2021-11-05 DIAGNOSIS — C7A8 Other malignant neuroendocrine tumors: Secondary | ICD-10-CM | POA: Diagnosis not present

## 2021-11-05 DIAGNOSIS — Z85828 Personal history of other malignant neoplasm of skin: Secondary | ICD-10-CM | POA: Insufficient documentation

## 2021-11-05 DIAGNOSIS — J439 Emphysema, unspecified: Secondary | ICD-10-CM | POA: Diagnosis not present

## 2021-11-05 DIAGNOSIS — I2510 Atherosclerotic heart disease of native coronary artery without angina pectoris: Secondary | ICD-10-CM | POA: Insufficient documentation

## 2021-11-05 DIAGNOSIS — I700 Atherosclerosis of aorta: Secondary | ICD-10-CM | POA: Insufficient documentation

## 2021-11-05 DIAGNOSIS — R262 Difficulty in walking, not elsewhere classified: Secondary | ICD-10-CM | POA: Diagnosis not present

## 2021-11-05 DIAGNOSIS — C50412 Malignant neoplasm of upper-outer quadrant of left female breast: Secondary | ICD-10-CM | POA: Insufficient documentation

## 2021-11-05 DIAGNOSIS — J449 Chronic obstructive pulmonary disease, unspecified: Secondary | ICD-10-CM | POA: Diagnosis not present

## 2021-11-05 DIAGNOSIS — M7989 Other specified soft tissue disorders: Secondary | ICD-10-CM | POA: Diagnosis not present

## 2021-11-05 DIAGNOSIS — Z85118 Personal history of other malignant neoplasm of bronchus and lung: Secondary | ICD-10-CM | POA: Diagnosis not present

## 2021-11-05 DIAGNOSIS — Z6821 Body mass index (BMI) 21.0-21.9, adult: Secondary | ICD-10-CM | POA: Diagnosis not present

## 2021-11-05 DIAGNOSIS — Z79899 Other long term (current) drug therapy: Secondary | ICD-10-CM | POA: Insufficient documentation

## 2021-11-05 DIAGNOSIS — R0602 Shortness of breath: Secondary | ICD-10-CM | POA: Insufficient documentation

## 2021-11-05 DIAGNOSIS — Z170 Estrogen receptor positive status [ER+]: Secondary | ICD-10-CM | POA: Insufficient documentation

## 2021-11-05 DIAGNOSIS — Z8042 Family history of malignant neoplasm of prostate: Secondary | ICD-10-CM | POA: Insufficient documentation

## 2021-11-05 DIAGNOSIS — J441 Chronic obstructive pulmonary disease with (acute) exacerbation: Secondary | ICD-10-CM | POA: Diagnosis not present

## 2021-11-05 DIAGNOSIS — Z7289 Other problems related to lifestyle: Secondary | ICD-10-CM | POA: Insufficient documentation

## 2021-11-05 NOTE — Telephone Encounter (Signed)
Patient hypoxic in the Oncology office (to low 80s) today and Oncology has reached out requesting have requested to have rapid follow up in Pulmonary clinic for oxygen evaluation due to desaturations during her visit. She has apparently refused to go to the ED. She needs follow up ASAP for an acute appointment and will need to establish with a new Pulmonology office provider eventually as well. Please call to schedule appointment.  I agree with this provider's recommendations and also recommend that this patient should be seen in the ED given the degree of desaturations.  Julian Hy, DO 11/05/21 3:34 PM [facility]

## 2021-11-05 NOTE — Telephone Encounter (Signed)
Called patient but she did not answer. I left a message for her to call us back. Will leave message in triage and mark red due to the nature of the message.

## 2021-11-05 NOTE — Telephone Encounter (Signed)
Attempted to call pt per NP. Pt did not answer. LVM for return call.

## 2021-11-05 NOTE — Progress Notes (Signed)
CLINIC:  Survivorship   REASON FOR VISIT:  Routine follow-up for history of breast cancer.   BRIEF ONCOLOGIC HISTORY:  Oncology History  Breast cancer of upper-outer quadrant of left female breast ([place])  05/20/2016 Initial Diagnosis   Left breast biopsy 2:30 position: IDC with DCIS, grade 1, ER 100%, PR 90%, Ki-67 5%, HER-2 negative ratio 1.33; is okay  1:30 position: 0.7 cm invasive lobular cancer, grade 1, ER/PR positive HER-2 negative Ki-67 10%, T1 N0 stage IA    06/09/2016 Breast MRI   Right breast: 1.3 x 0.5 x 0.8 cm linearly oriented mass extends posteriorly to the level of pectoralis muscle, no lymph nodes, 1.8 cm left lower lobe lung nodule   07/04/2016 PET scan   1.7 cm hypermetabolic nodule medial left lower lobe suspicious for lung cancer. Moderate emphysema, tiny subcentimeter pulmonary nodules too small to characterize   07/25/2016 Surgery   Right lumpectomy: DCIS 0.2 cm, margins negative, ER 95%, PR 70%; Tis N0 stage 0 Left lumpectomy: IDC with DCIS 0.8 cm ER 100%, PR 90%, HER-2 negative, Ki-67 5% and 0.5 cm ER 95%, PR 70%, HER-2 negative, Ki-67 10%, T1b NX stage I a multifocal    08/28/2016 - 09/24/2017 Anti-estrogen oral therapy   Anastrozole 1 mg daily stopped for adverse effects like joint pain   Neuroendocrine carcinoma of lung ([place])  08/25/2016 Initial Diagnosis   Low Grade Neuroendocrine carcinoma of lung ([place])      INTERVAL HISTORY:  Victoria Rice presents to the Survivorship Clinic today for routine follow-up for her history of breast cancer.    Upon the nurse tech getting her vital she noticed that her oxygen saturation after walking from the waiting area to the exam room was about low to mid 80 percentile.  After sitting for a couple minutes she did slowly recover to 88 to 90%.  [name] notes that her oxygen saturation was similar when she saw Dr. Servando Snare in October to review her CT scan of her lungs which was completed on September 10, 2021 and showed a smoothly  marginated macrolobulated left lower lobe pulmonary nodule measuring 1.9 x 1 cm consistent with pulmonary carcinoid which had been grossly stable over a few years.  She had some stable smaller pulmonary nodules chronic postinfectious/postinflammatory scarring that was stable and chronic in nature, aortic atherosclerosis in addition to three-vessel coronary artery disease, aortic calcifications, and cholelithiasis.  Bisbee has a history of COPD, she was seen by Dr. Carlis Abbott at [facility]ology in October 2021.  She was prescribed an inhaler which she did not use.  She was supposed to follow-up 3 months later however she did not.  She said she felt like she was getting better and she, "did not need any of that stuff."  At that time her oxygen saturation was 96% on room air.  Meia denies any fever chills chest pain cough nasal drainage.  She does notice that she gets a little bit more winded after activity, however she denies any new breathing concerns or feeling overly winded.  [name] underwent a bilateral breast screening mammogram with tomosynthesis and CAD on August 20, 2021 which showed no mammographic evidence of malignancy and breast density category B  [name] notes that she has recently been started on amlodipine.  Since starting on this medication she has developed some swelling in her bilateral lower extremities.  Due to the swelling she was started on Lasix however she thinks that it dries her out too much and does not take  it every day.  She has an appointment with her primary care provider this afternoon.    REVIEW OF SYSTEMS:  Review of Systems  Constitutional:  Negative for appetite change, chills, fatigue, fever and unexpected weight change.  HENT:   Negative for hearing loss, lump/mass, nosebleeds, sore throat and trouble swallowing.   Eyes:  Negative for eye problems and icterus.  Respiratory:  Positive for shortness of breath. Negative for chest tightness, cough and  wheezing.   Cardiovascular:  Positive for leg swelling. Negative for chest pain and palpitations.  Gastrointestinal:  Negative for abdominal distention, abdominal pain, constipation, diarrhea, nausea and vomiting.  Endocrine: Negative for hot flashes.  Genitourinary:  Negative for difficulty urinating.   Musculoskeletal:  Negative for arthralgias.  Skin:  Negative for itching and rash.  Neurological:  Negative for dizziness, extremity weakness, headaches and numbness.  Hematological:  Negative for adenopathy. Does not bruise/bleed easily.  Psychiatric/Behavioral:  Negative for depression. The patient is not nervous/anxious.  Breast: Denies any new nodularity, masses, tenderness, nipple changes, or nipple discharge.       PAST MEDICAL/SURGICAL HISTORY:  Past Medical History:  Diagnosis Date   Breast cancer ([place])    lumpectomy, no chemo or XRT; 1 year of anastrazole   Cataract    COPD, severe ([place]) 07/31/2016   Family history of adverse reaction to anesthesia    " my sisiter was unable to move for a while with the spinal anesthesia."   Hypothyroidism    nodule    Neuroendocrine carcinoma of lung ([place]) 10/26/2016   Nodule of left lung    lower lobe   Pneumonia    as an infant   Sebaceous cyst of labia 08/18/2018   Shortness of breath dyspnea    recently increased    Skin cancer    nose- tx. with [facility]    Thyroid nodule    Vaginal delivery    x2 /w  one being breech   Past Surgical History:  Procedure Laterality Date   BREAST LUMPECTOMY Right 1970's   R breast   BREAST LUMPECTOMY Bilateral 07/23/2016   BREAST LUMPECTOMY WITH NEEDLE LOCALIZATION Left 07/23/2016   Procedure: LEFT BREAST LUMPECTOMY WITH DOUBLE NEEDLE LOCALIZATION;  Surgeon: Fanny Skates, MD;  Location: [facility];  Service: General;  Laterality: Left;   BREAST LUMPECTOMY WITH RADIOACTIVE SEED LOCALIZATION Right 07/23/2016   Procedure: RIGHT BREAST LUMPECTOMY WITH RADIOACTIVE SEED LOCALIZATION;  Surgeon:  Fanny Skates, MD;  Location: [facility];  Service: General;  Laterality: Right;   CATARACT EXTRACTION W/ INTRAOCULAR LENS IMPLANT     right eye   EYE SURGERY Right    /w IOL- cataract removed    FUDUCIAL PLACEMENT N/A 08/25/2016   Procedure: PLACEMENT OF FUDUCIAL;  Surgeon: Melrose Nakayama, MD;  Location: [facility];  Service: Thoracic;  Laterality: N/A;   VARICOSE VEIN SURGERY Right    w/o stitches on R leg   [place] N/A 08/25/2016   Procedure: VIDEO BRONCHOSCOPY WITH ENDOBRONCHIAL NAVIGATION;  Surgeon: Melrose Nakayama, MD;  Location: [facility];  Service: Thoracic;  Laterality: N/A;     ALLERGIES:  Allergies  Allergen Reactions   Actonel [Risedronate Sodium] Other (See Comments)    indigestion     CURRENT MEDICATIONS:  Outpatient Encounter Medications as of 11/05/2021  Medication Sig   AMLODIPINE BENZOATE PO Take 2.5 mg by mouth.   cholecalciferol (VITAMIN D) 1000 units tablet Take 1,000 Units by mouth daily.   furosemide (LASIX) 40  MG tablet Take 40 mg by mouth. Stopping due to leg cramps   Ipratropium-Albuterol (COMBIVENT RESPIMAT) 20-100 MCG/ACT AERS respimat Inhale 1 puff into the lungs every 6 (six) hours as needed for wheezing or shortness of breath. (Patient not taking: Reported on 09/10/2021)   levothyroxine (SYNTHROID, LEVOTHROID) 50 MCG tablet Take 50 mcg by mouth daily before breakfast.   naproxen sodium (ANAPROX) 220 MG tablet Take 220 mg by mouth 2 (two) times daily as needed (for pain.).    No facility-administered encounter medications on file as of 11/05/2021.     ONCOLOGIC FAMILY HISTORY:  Family History  Problem Relation Age of Onset   Prostate cancer Brother    Lung cancer Brother    Cystic fibrosis Cousin     GENETIC COUNSELING/TESTING: Not at this tim  SOCIAL HISTORY:  Social History   Socioeconomic History   Marital status: Widowed    Spouse name: Not on file   Number of children: 2   Years of education:  Not on file   Highest education level: Not on file  Occupational History   Not on file  Tobacco Use   Smoking status: Former    Packs/day: 0.75    Years: 20.00    Pack years: 15.00    Types: Cigarettes    Quit date: 05/08/1974    Years since quitting: 47.5   Smokeless tobacco: Never  Vaping Use   Vaping Use: Never used  Substance and Sexual Activity   Alcohol use: Yes    Alcohol/week: 6.0 standard drinks    Types: 6 Glasses of wine per week    Comment: social   Drug use: No   Sexual activity: Never  Other Topics Concern   Not on file  Social History Narrative   Not on file   Social Determinants of Health   Financial Resource Strain: Not on file  Food Insecurity: Not on file  Transportation Needs: Not on file  Physical Activity: Not on file  Stress: Not on file  Social Connections: Not on file  Intimate Partner Violence: Not on file      PHYSICAL EXAMINATION:  Vital Signs: Vitals:   11/05/21 1317  BP: (!) 160/63  Pulse: 92  Resp: 18  Temp: 98.1 F (36.7 C)  SpO2: 90%   Filed Weights   11/05/21 1317  Weight: 109 lb (49.4 kg)   General: Well-nourished, well-appearing female in no acute distress.  Unaccompanied today.   HEENT: Head is normocephalic.  Pupils equal and reactive to light. Conjunctivae clear without exudate.  Sclerae anicteric. Mask in place.  Lymph: No cervical, supraclavicular, or infraclavicular lymphadenopathy noted on palpation.  Cardiovascular: Regular rate and rhythm.[name] Respiratory: Decreased air movement throughout she does appear truncated and slightly tachypneic. Breast Exam:  Deferred today. -Axilla: No axillary adenopathy bilaterally.  GI: Abdomen soft and round; non-tender, non-distended. Bowel sounds normoactive. No hepatosplenomegaly.   GU: Deferred.  Neuro: No focal deficits. Steady gait.  Psych: Mood and affect normal and appropriate for situation.  MSK: No focal spinal tenderness to palpation, full range of motion in bilateral  upper extremities Extremities: No edema. Skin: Warm and dry.  LABORATORY DATA:  None for this visit   DIAGNOSTIC IMAGING:  Most recent mammogram:  CLINICAL DATA:  Screening.   EXAM: DIGITAL SCREENING BILATERAL MAMMOGRAM WITH TOMOSYNTHESIS AND CAD   TECHNIQUE: Bilateral screening digital craniocaudal and mediolateral oblique mammograms were obtained. Bilateral screening digital breast tomosynthesis was performed. The images were evaluated with computer-aided detection.   COMPARISON:  Previous exam(s).   ACR Breast Density Category b: There are scattered areas of fibroglandular density.   FINDINGS: There are no findings suspicious for malignancy.   IMPRESSION: No mammographic evidence of malignancy. A result letter of this screening mammogram will be mailed directly to the patient.   RECOMMENDATION: Screening mammogram in one year. (Code:SM-B-01Y)   BI-RADS CATEGORY  1: Negative.     Electronically Signed   By: Lajean Manes M.D.   On: 08/20/2021 16:03     ASSESSMENT AND PLAN:  Ms.. Hanger is a pleasant 87 y.o. female with history of Stage IA left breast invasive ductal carcinoma, ER+/PR+/HER2-, diagnosed in 05/2016, treated with lumpectomy and anti estrogen therapy with Anastrozole x 1 year (unable to tolerate and completed in 09/2017).  She presents to the Survivorship Clinic for surveillance and routine follow-up.   1. History of breast cancer:  Ms. Leckrone is currently clinically and radiographically without evidence of disease or recurrence of breast cancer.  She is due for a repeat mammogram in September 2023.  Due to my concern for her lungs I did not do a clinical breast exam today.  She is without breast concerns she will return in 1 year for continued surveillance and monitoring.  I reviewed her overall health recommendations  I encouraged her to call me with any questions or concerns before her next visit at the cancer center, and I would be happy to see her  sooner, if needed.    2. Neuroendocrine carcinoma of lung: no sign of recurrence, most recent CT in 09/2021 stable.    3. Aortic Atherosclerosis: noted on recent CT chest, following with PCP regularly.    4. Emphysema/COPD: This has worsened since her last visit 1 year ago.  I will let [place] know that I am concerned about her difficulty in walking and maintaining her oxygen saturations.  Considering the fact that she dropped down to the mid to low 80s while walking I did suggest that she go to the emergency room for further evaluation and a faster work-up then we could do here.  She does not want to do this and refused, she did agree to have a chest x-ray before she leaves today and goes to her primary care follow-up today at 3:30 PM.  We did discuss reasons to call 911 for EMS to come and get her in detail.  I will reach out to her pulmonologist to see if she can get her back in.  I reviewed with Vicki my recommendation for her to see her lung specialist again.    5. Bone health: [name] has several increased risk factors for bone demineralization however declines bone density testing at this time.  6. Cancer screening:  Due to Ms. Crossman's history and her age, she should receive screening for skin cancers. She was encouraged to follow-up with her PCP for appropriate cancer screenings.   7. Health maintenance and wellness promotion: Ms. Tsou was encouraged to consume 5-7 servings of fruits and vegetables per day. She was also encouraged to engage in moderate to vigorous exercise for 30 minutes per day most days of the week. She was instructed to limit her alcohol consumption and continue to abstain from tobacco use.      Dispo:  -Return to cancer center in one year for LTS f/u -Mammogram in 08/2022 -See pulmonology promptly.  Total encounter time: 40 minutes*in face-to-face visit time, chart review, lab review, order entry, imaging review, care coordination, and documentation of the  encounter.  Wilber Bihari, NP 11/05/21 2:41 PM Medical Oncology and Hematology [facility] [place], [place] [address] Tel. [phone]    Fax. [phone]  *Total Encounter Time as defined by the Centers for Medicare and Medicaid Services includes, in addition to the face-to-face time of a patient visit (documented in the note above) non-face-to-face time: obtaining and reviewing outside history, ordering and reviewing medications, tests or procedures, care coordination (communications with other health care professionals or caregivers) and documentation in the medical record.   Note: PRIMARY CARE PROVIDER Faustino Congress, [place] [phone]  Addendum: Her chest x-ray shows pulmonary hyperinflation without acute abnormality.  This was completed today.  The patient had already left by the time the results reported, I asked my nurse to call the patient with the results and fax the results to the patient's primary care provider.  My nurse also plans on following up with the patient about her shortness of breath.  I also reviewed the above case with Dr. Chase Caller and Dr. Carlis Abbott, who agree with recommendation for ER and are working to get the patient in this week, unless her PCP is able to convince her to go.

## 2021-11-05 NOTE — Telephone Encounter (Signed)
-----   Message from Gardenia Phlegm, NP sent at 11/05/2021  3:27 PM EST ----- Please fax these results to the patient's primary care provider and call the patient and let her know that I have reached out to her pulmonologist to get her back in for better management of her COPD.  The results are consistent with her COPD like we thought they might be. ----- Message ----- From: Interface, Rad Results In Sent: 11/05/2021   2:38 PM EST To: Gardenia Phlegm, NP

## 2021-11-06 NOTE — Telephone Encounter (Signed)
I called and spoke with the pt and notified her of recommendation to make office visit here to eval for home o2. She declined at this time, stating that she has already seen her PCP about this. She states they did not order o2 for her. I advised again, that she should consider coming in for visit with [place] as soon as possible so that we can assess her needs. She kindly refused, stating she will call back if she changes her mind. She has our contact information. Will forward to Wilber Bihari, NP with oncology and also Dr Carlis Abbott.

## 2021-11-08 DIAGNOSIS — Z6821 Body mass index (BMI) 21.0-21.9, adult: Secondary | ICD-10-CM | POA: Diagnosis not present

## 2021-11-08 DIAGNOSIS — R0602 Shortness of breath: Secondary | ICD-10-CM | POA: Diagnosis not present

## 2021-11-08 DIAGNOSIS — M7989 Other specified soft tissue disorders: Secondary | ICD-10-CM | POA: Diagnosis not present

## 2021-11-08 DIAGNOSIS — J441 Chronic obstructive pulmonary disease with (acute) exacerbation: Secondary | ICD-10-CM | POA: Diagnosis not present

## 2021-11-25 DIAGNOSIS — D3A090 Benign carcinoid tumor of the bronchus and lung: Secondary | ICD-10-CM | POA: Diagnosis not present

## 2021-11-25 DIAGNOSIS — R0902 Hypoxemia: Secondary | ICD-10-CM | POA: Diagnosis not present

## 2021-11-25 DIAGNOSIS — J432 Centrilobular emphysema: Secondary | ICD-10-CM | POA: Diagnosis not present

## 2021-11-25 DIAGNOSIS — M545 Low back pain, unspecified: Secondary | ICD-10-CM | POA: Diagnosis not present

## 2021-11-25 DIAGNOSIS — J449 Chronic obstructive pulmonary disease, unspecified: Secondary | ICD-10-CM | POA: Diagnosis not present

## 2021-11-25 DIAGNOSIS — R0602 Shortness of breath: Secondary | ICD-10-CM | POA: Diagnosis not present

## 2021-12-03 DIAGNOSIS — R1011 Right upper quadrant pain: Secondary | ICD-10-CM | POA: Diagnosis not present

## 2021-12-03 DIAGNOSIS — J42 Unspecified chronic bronchitis: Secondary | ICD-10-CM | POA: Diagnosis not present

## 2021-12-15 ENCOUNTER — Other Ambulatory Visit: Payer: Self-pay

## 2021-12-15 ENCOUNTER — Emergency Department (HOSPITAL_COMMUNITY): Payer: Medicare PPO

## 2021-12-15 ENCOUNTER — Encounter (HOSPITAL_COMMUNITY): Payer: Self-pay

## 2021-12-15 ENCOUNTER — Inpatient Hospital Stay (HOSPITAL_COMMUNITY)
Admission: EM | Admit: 2021-12-15 | Discharge: 2021-12-30 | DRG: 190 | Disposition: A | Discharge status: Skilled Nursing Facility | Payer: Medicare PPO | Attending: Internal Medicine | Admitting: Internal Medicine

## 2021-12-15 DIAGNOSIS — J209 Acute bronchitis, unspecified: Secondary | ICD-10-CM | POA: Diagnosis not present

## 2021-12-15 DIAGNOSIS — E871 Hypo-osmolality and hyponatremia: Secondary | ICD-10-CM | POA: Diagnosis present

## 2021-12-15 DIAGNOSIS — J449 Chronic obstructive pulmonary disease, unspecified: Secondary | ICD-10-CM | POA: Diagnosis not present

## 2021-12-15 DIAGNOSIS — R0602 Shortness of breath: Secondary | ICD-10-CM | POA: Diagnosis not present

## 2021-12-15 DIAGNOSIS — J471 Bronchiectasis with (acute) exacerbation: Secondary | ICD-10-CM | POA: Diagnosis not present

## 2021-12-15 DIAGNOSIS — E860 Dehydration: Secondary | ICD-10-CM | POA: Diagnosis present

## 2021-12-15 DIAGNOSIS — R609 Edema, unspecified: Secondary | ICD-10-CM | POA: Diagnosis not present

## 2021-12-15 DIAGNOSIS — R5381 Other malaise: Secondary | ICD-10-CM | POA: Diagnosis not present

## 2021-12-15 DIAGNOSIS — D3A090 Benign carcinoid tumor of the bronchus and lung: Secondary | ICD-10-CM | POA: Diagnosis present

## 2021-12-15 DIAGNOSIS — I700 Atherosclerosis of aorta: Secondary | ICD-10-CM

## 2021-12-15 DIAGNOSIS — E874 Mixed disorder of acid-base balance: Secondary | ICD-10-CM | POA: Diagnosis not present

## 2021-12-15 DIAGNOSIS — R64 Cachexia: Secondary | ICD-10-CM | POA: Diagnosis present

## 2021-12-15 DIAGNOSIS — I10 Essential (primary) hypertension: Secondary | ICD-10-CM | POA: Diagnosis not present

## 2021-12-15 DIAGNOSIS — Z66 Do not resuscitate: Secondary | ICD-10-CM | POA: Diagnosis present

## 2021-12-15 DIAGNOSIS — G4700 Insomnia, unspecified: Secondary | ICD-10-CM | POA: Diagnosis present

## 2021-12-15 DIAGNOSIS — Z6821 Body mass index (BMI) 21.0-21.9, adult: Secondary | ICD-10-CM

## 2021-12-15 DIAGNOSIS — K219 Gastro-esophageal reflux disease without esophagitis: Secondary | ICD-10-CM | POA: Diagnosis present

## 2021-12-15 DIAGNOSIS — J9811 Atelectasis: Secondary | ICD-10-CM | POA: Diagnosis not present

## 2021-12-15 DIAGNOSIS — J9622 Acute and chronic respiratory failure with hypercapnia: Secondary | ICD-10-CM | POA: Diagnosis not present

## 2021-12-15 DIAGNOSIS — Z853 Personal history of malignant neoplasm of breast: Secondary | ICD-10-CM

## 2021-12-15 DIAGNOSIS — R0689 Other abnormalities of breathing: Secondary | ICD-10-CM | POA: Diagnosis not present

## 2021-12-15 DIAGNOSIS — Z85828 Personal history of other malignant neoplasm of skin: Secondary | ICD-10-CM

## 2021-12-15 DIAGNOSIS — J441 Chronic obstructive pulmonary disease with (acute) exacerbation: Principal | ICD-10-CM | POA: Diagnosis present

## 2021-12-15 DIAGNOSIS — Z7401 Bed confinement status: Secondary | ICD-10-CM | POA: Diagnosis not present

## 2021-12-15 DIAGNOSIS — E46 Unspecified protein-calorie malnutrition: Secondary | ICD-10-CM | POA: Diagnosis present

## 2021-12-15 DIAGNOSIS — I110 Hypertensive heart disease with heart failure: Secondary | ICD-10-CM | POA: Diagnosis present

## 2021-12-15 DIAGNOSIS — K5909 Other constipation: Secondary | ICD-10-CM | POA: Diagnosis present

## 2021-12-15 DIAGNOSIS — C3490 Malignant neoplasm of unspecified part of unspecified bronchus or lung: Secondary | ICD-10-CM | POA: Diagnosis present

## 2021-12-15 DIAGNOSIS — E039 Hypothyroidism, unspecified: Secondary | ICD-10-CM | POA: Diagnosis present

## 2021-12-15 DIAGNOSIS — R918 Other nonspecific abnormal finding of lung field: Secondary | ICD-10-CM | POA: Diagnosis not present

## 2021-12-15 DIAGNOSIS — C7A8 Other malignant neuroendocrine tumors: Secondary | ICD-10-CM | POA: Diagnosis not present

## 2021-12-15 DIAGNOSIS — R000 Tachycardia, unspecified: Secondary | ICD-10-CM | POA: Diagnosis not present

## 2021-12-15 DIAGNOSIS — R0603 Acute respiratory distress: Secondary | ICD-10-CM | POA: Diagnosis not present

## 2021-12-15 DIAGNOSIS — R0600 Dyspnea, unspecified: Secondary | ICD-10-CM | POA: Diagnosis not present

## 2021-12-15 DIAGNOSIS — I2781 Cor pulmonale (chronic): Secondary | ICD-10-CM | POA: Diagnosis present

## 2021-12-15 DIAGNOSIS — J90 Pleural effusion, not elsewhere classified: Secondary | ICD-10-CM

## 2021-12-15 DIAGNOSIS — J9621 Acute and chronic respiratory failure with hypoxia: Secondary | ICD-10-CM | POA: Diagnosis present

## 2021-12-15 DIAGNOSIS — C7A090 Malignant carcinoid tumor of the bronchus and lung: Secondary | ICD-10-CM | POA: Diagnosis not present

## 2021-12-15 DIAGNOSIS — Z87891 Personal history of nicotine dependence: Secondary | ICD-10-CM

## 2021-12-15 DIAGNOSIS — F419 Anxiety disorder, unspecified: Secondary | ICD-10-CM | POA: Diagnosis present

## 2021-12-15 DIAGNOSIS — J9602 Acute respiratory failure with hypercapnia: Secondary | ICD-10-CM | POA: Diagnosis not present

## 2021-12-15 DIAGNOSIS — Z7951 Long term (current) use of inhaled steroids: Secondary | ICD-10-CM

## 2021-12-15 DIAGNOSIS — R0682 Tachypnea, not elsewhere classified: Secondary | ICD-10-CM | POA: Diagnosis not present

## 2021-12-15 DIAGNOSIS — E878 Other disorders of electrolyte and fluid balance, not elsewhere classified: Secondary | ICD-10-CM | POA: Diagnosis present

## 2021-12-15 DIAGNOSIS — J9601 Acute respiratory failure with hypoxia: Secondary | ICD-10-CM

## 2021-12-15 DIAGNOSIS — Z79899 Other long term (current) drug therapy: Secondary | ICD-10-CM

## 2021-12-15 DIAGNOSIS — E861 Hypovolemia: Secondary | ICD-10-CM | POA: Diagnosis present

## 2021-12-15 DIAGNOSIS — Z801 Family history of malignant neoplasm of trachea, bronchus and lung: Secondary | ICD-10-CM

## 2021-12-15 DIAGNOSIS — C50412 Malignant neoplasm of upper-outer quadrant of left female breast: Secondary | ICD-10-CM | POA: Diagnosis not present

## 2021-12-15 DIAGNOSIS — J440 Chronic obstructive pulmonary disease with acute lower respiratory infection: Secondary | ICD-10-CM | POA: Diagnosis not present

## 2021-12-15 DIAGNOSIS — Z20822 Contact with and (suspected) exposure to covid-19: Secondary | ICD-10-CM | POA: Diagnosis not present

## 2021-12-15 DIAGNOSIS — R109 Unspecified abdominal pain: Secondary | ICD-10-CM | POA: Diagnosis not present

## 2021-12-15 DIAGNOSIS — R0989 Other specified symptoms and signs involving the circulatory and respiratory systems: Secondary | ICD-10-CM

## 2021-12-15 DIAGNOSIS — I5032 Chronic diastolic (congestive) heart failure: Secondary | ICD-10-CM | POA: Diagnosis present

## 2021-12-15 DIAGNOSIS — I2510 Atherosclerotic heart disease of native coronary artery without angina pectoris: Secondary | ICD-10-CM | POA: Diagnosis present

## 2021-12-15 DIAGNOSIS — G9341 Metabolic encephalopathy: Secondary | ICD-10-CM | POA: Diagnosis present

## 2021-12-15 DIAGNOSIS — M2550 Pain in unspecified joint: Secondary | ICD-10-CM | POA: Diagnosis not present

## 2021-12-15 DIAGNOSIS — J439 Emphysema, unspecified: Secondary | ICD-10-CM | POA: Diagnosis not present

## 2021-12-15 DIAGNOSIS — Z79890 Hormone replacement therapy (postmenopausal): Secondary | ICD-10-CM

## 2021-12-15 DIAGNOSIS — J810 Acute pulmonary edema: Secondary | ICD-10-CM | POA: Diagnosis not present

## 2021-12-15 DIAGNOSIS — R0902 Hypoxemia: Secondary | ICD-10-CM | POA: Diagnosis not present

## 2021-12-15 DIAGNOSIS — E875 Hyperkalemia: Secondary | ICD-10-CM | POA: Diagnosis not present

## 2021-12-15 DIAGNOSIS — Z8042 Family history of malignant neoplasm of prostate: Secondary | ICD-10-CM

## 2021-12-15 DIAGNOSIS — R54 Age-related physical debility: Secondary | ICD-10-CM | POA: Diagnosis present

## 2021-12-15 LAB — CBC WITH DIFFERENTIAL/PLATELET
Abs Immature Granulocytes: 0.06 10*3/uL (ref 0.00–0.07)
Basophils Absolute: 0 10*3/uL (ref 0.0–0.1)
Basophils Relative: 0 %
Eosinophils Absolute: 0 10*3/uL (ref 0.0–0.5)
Eosinophils Relative: 0 %
HCT: 49 % — ABNORMAL HIGH (ref 36.0–46.0)
Hemoglobin: 16 g/dL — ABNORMAL HIGH (ref 12.0–15.0)
Immature Granulocytes: 1 %
Lymphocytes Relative: 5 %
Lymphs Abs: 0.7 10*3/uL (ref 0.7–4.0)
MCH: 29.6 pg (ref 26.0–34.0)
MCHC: 32.7 g/dL (ref 30.0–36.0)
MCV: 90.6 fL (ref 80.0–100.0)
Monocytes Absolute: 1.2 10*3/uL — ABNORMAL HIGH (ref 0.1–1.0)
Monocytes Relative: 9 %
Neutro Abs: 11.3 10*3/uL — ABNORMAL HIGH (ref 1.7–7.7)
Neutrophils Relative %: 85 %
Platelets: 312 10*3/uL (ref 150–400)
RBC: 5.41 MIL/uL — ABNORMAL HIGH (ref 3.87–5.11)
RDW: 12.1 % (ref 11.5–15.5)
WBC: 13.3 10*3/uL — ABNORMAL HIGH (ref 4.0–10.5)
nRBC: 0 % (ref 0.0–0.2)

## 2021-12-15 LAB — COMPREHENSIVE METABOLIC PANEL
ALT: 20 U/L (ref 0–44)
AST: 23 U/L (ref 15–41)
Albumin: 4.1 g/dL (ref 3.5–5.0)
Alkaline Phosphatase: 81 U/L (ref 38–126)
Anion gap: 11 (ref 5–15)
BUN: 9 mg/dL (ref 8–23)
CO2: 34 mmol/L — ABNORMAL HIGH (ref 22–32)
Calcium: 8.7 mg/dL — ABNORMAL LOW (ref 8.9–10.3)
Chloride: 76 mmol/L — ABNORMAL LOW (ref 98–111)
Creatinine, Ser: 0.67 mg/dL (ref 0.44–1.00)
GFR, Estimated: >60 mL/min (ref >60)
Glucose, Bld: 110 mg/dL — ABNORMAL HIGH (ref 70–99)
Potassium: 3.5 mmol/L (ref 3.5–5.1)
Sodium: 121 mmol/L — ABNORMAL LOW (ref 135–145)
Total Bilirubin: 1.2 mg/dL (ref 0.3–1.2)
Total Protein: 7.2 g/dL (ref 6.5–8.1)

## 2021-12-15 LAB — TROPONIN I (HIGH SENSITIVITY): Troponin I (High Sensitivity): 4 ng/L (ref <18)

## 2021-12-15 LAB — RESP PANEL BY RT-PCR (FLU A&B, COVID) ARPGX2
Influenza A by PCR: NEGATIVE
Influenza B by PCR: NEGATIVE
SARS Coronavirus 2 by RT PCR: NEGATIVE

## 2021-12-15 LAB — BLOOD GAS, VENOUS
Acid-Base Excess: 6.7 mmol/L — ABNORMAL HIGH (ref 0.0–2.0)
Bicarbonate: 36.8 mmol/L — ABNORMAL HIGH (ref 20.0–28.0)
O2 Saturation: 57 %
Patient temperature: 98.6
pCO2, Ven: 80.8 mmHg (ref 44.0–60.0)
pH, Ven: 7.281 (ref 7.250–7.430)
pO2, Ven: 36.5 mmHg (ref 32.0–45.0)

## 2021-12-15 LAB — MAGNESIUM: Magnesium: 1.9 mg/dL (ref 1.7–2.4)

## 2021-12-15 LAB — BRAIN NATRIURETIC PEPTIDE: B Natriuretic Peptide: 52.7 pg/mL (ref 0.0–100.0)

## 2021-12-15 LAB — MRSA NEXT GEN BY PCR, NASAL: MRSA by PCR Next Gen: NOT DETECTED

## 2021-12-15 MED ORDER — LACTATED RINGERS IV BOLUS
500.0000 mL | Freq: Once | INTRAVENOUS | Status: AC
  Administered 2021-12-15: 500 mL via INTRAVENOUS

## 2021-12-15 MED ORDER — ARFORMOTEROL TARTRATE 15 MCG/2ML IN NEBU
15.0000 ug | INHALATION_SOLUTION | Freq: Two times a day (BID) | RESPIRATORY_TRACT | Status: DC
  Administered 2021-12-15 – 2021-12-30 (×30): 15 ug via RESPIRATORY_TRACT
  Filled 2021-12-15 (×31): qty 2

## 2021-12-15 MED ORDER — METOPROLOL TARTRATE 5 MG/5ML IV SOLN
5.0000 mg | Freq: Four times a day (QID) | INTRAVENOUS | Status: DC
  Administered 2021-12-15 – 2021-12-16 (×3): 5 mg via INTRAVENOUS
  Filled 2021-12-15 (×3): qty 5

## 2021-12-15 MED ORDER — PREDNISONE 20 MG PO TABS
40.0000 mg | ORAL_TABLET | Freq: Every day | ORAL | Status: DC
  Administered 2021-12-17 – 2021-12-18 (×2): 40 mg via ORAL
  Filled 2021-12-15 (×3): qty 2

## 2021-12-15 MED ORDER — ALBUTEROL SULFATE (2.5 MG/3ML) 0.083% IN NEBU: 2.5000 mg | INHALATION_SOLUTION | Freq: Four times a day (QID) | RESPIRATORY_TRACT | Status: DC | PRN

## 2021-12-15 MED ORDER — BUDESONIDE 0.5 MG/2ML IN SUSP
RESPIRATORY_TRACT | Status: AC
  Filled 2021-12-15: qty 4

## 2021-12-15 MED ORDER — CHLORHEXIDINE GLUCONATE CLOTH 2 % EX PADS
6.0000 | MEDICATED_PAD | Freq: Every day | CUTANEOUS | Status: DC
  Administered 2021-12-15 – 2021-12-24 (×10): 6 via TOPICAL

## 2021-12-15 MED ORDER — LACTATED RINGERS IV BOLUS: 1000.0000 mL | Freq: Once | INTRAVENOUS | Status: DC

## 2021-12-15 MED ORDER — IPRATROPIUM-ALBUTEROL 0.5-2.5 (3) MG/3ML IN SOLN: 3.0000 mL | Freq: Four times a day (QID) | RESPIRATORY_TRACT | Status: DC | PRN

## 2021-12-15 MED ORDER — SODIUM CHLORIDE 0.9 % IV SOLN
2.0000 g | Freq: Two times a day (BID) | INTRAVENOUS | Status: DC
  Administered 2021-12-15 – 2021-12-16 (×2): 2 g via INTRAVENOUS
  Filled 2021-12-15 (×2): qty 2

## 2021-12-15 MED ORDER — UMECLIDINIUM BROMIDE 62.5 MCG/ACT IN AEPB
1.0000 | INHALATION_SPRAY | Freq: Every day | RESPIRATORY_TRACT | Status: DC
  Filled 2021-12-15: qty 7

## 2021-12-15 MED ORDER — ALBUTEROL SULFATE (2.5 MG/3ML) 0.083% IN NEBU
10.0000 mg/h | INHALATION_SOLUTION | Freq: Once | RESPIRATORY_TRACT | Status: AC
  Administered 2021-12-15: 10 mg/h via RESPIRATORY_TRACT
  Filled 2021-12-15: qty 3

## 2021-12-15 MED ORDER — AMLODIPINE BESYLATE 5 MG PO TABS
2.5000 mg | ORAL_TABLET | Freq: Every day | ORAL | Status: DC
  Administered 2021-12-15 – 2021-12-30 (×12): 2.5 mg via ORAL
  Filled 2021-12-15 (×14): qty 1

## 2021-12-15 MED ORDER — SODIUM CHLORIDE 0.9 % IV SOLN: 2.0000 g | Freq: Three times a day (TID) | INTRAVENOUS | Status: DC

## 2021-12-15 MED ORDER — ORAL CARE MOUTH RINSE
15.0000 mL | Freq: Two times a day (BID) | OROMUCOSAL | Status: DC
  Administered 2021-12-16 – 2021-12-29 (×26): 15 mL via OROMUCOSAL

## 2021-12-15 MED ORDER — METHYLPREDNISOLONE SODIUM SUCC 40 MG IJ SOLR
40.0000 mg | Freq: Two times a day (BID) | INTRAMUSCULAR | Status: AC
  Administered 2021-12-15 – 2021-12-16 (×2): 40 mg via INTRAVENOUS
  Filled 2021-12-15 (×2): qty 1

## 2021-12-15 MED ORDER — LEVOTHYROXINE SODIUM 50 MCG PO TABS
50.0000 ug | ORAL_TABLET | Freq: Every day | ORAL | Status: DC
  Administered 2021-12-16 – 2021-12-30 (×13): 50 ug via ORAL
  Filled 2021-12-15 (×14): qty 1

## 2021-12-15 MED ORDER — ENOXAPARIN SODIUM 30 MG/0.3ML IJ SOSY
30.0000 mg | PREFILLED_SYRINGE | INTRAMUSCULAR | Status: DC
  Administered 2021-12-15: 30 mg via SUBCUTANEOUS
  Filled 2021-12-15: qty 0.3

## 2021-12-15 MED ORDER — SODIUM CHLORIDE 0.9 % IV SOLN
INTRAVENOUS | Status: DC
Start: 2021-12-15 — End: 2021-12-17

## 2021-12-15 MED ORDER — BUDESONIDE 0.5 MG/2ML IN SUSP
1.0000 mg | Freq: Two times a day (BID) | RESPIRATORY_TRACT | Status: DC
  Administered 2021-12-15 – 2021-12-16 (×2): 1 mg via RESPIRATORY_TRACT
  Filled 2021-12-15: qty 4

## 2021-12-15 MED ORDER — UMECLIDINIUM BROMIDE 62.5 MCG/ACT IN AEPB
1.0000 | INHALATION_SPRAY | Freq: Every day | RESPIRATORY_TRACT | Status: DC
  Administered 2021-12-16: 1 via RESPIRATORY_TRACT
  Filled 2021-12-15: qty 7

## 2021-12-15 MED ORDER — TIOTROPIUM BROMIDE MONOHYDRATE 2.5 MCG/ACT IN AERS: 2.0000 | INHALATION_SPRAY | Freq: Every day | RESPIRATORY_TRACT | Status: DC

## 2021-12-15 MED ORDER — BUDESONIDE 0.5 MG/2ML IN SUSP: 2.0000 mg | Freq: Two times a day (BID) | RESPIRATORY_TRACT | Status: DC

## 2021-12-15 MED ORDER — CHLORHEXIDINE GLUCONATE 0.12 % MT SOLN
15.0000 mL | Freq: Two times a day (BID) | OROMUCOSAL | Status: DC
  Administered 2021-12-15 – 2021-12-30 (×27): 15 mL via OROMUCOSAL
  Filled 2021-12-15 (×26): qty 15

## 2021-12-15 NOTE — ED Notes (Signed)
ED TO INPATIENT HANDOFF REPORT  Name/Age/Gender Victoria Rice 88 y.o. female  Code Status    Code Status Orders  (From admission, onward)           Start     Ordered   12/15/21 1605  Full code  Continuous        12/15/21 1606           Code Status History     This patient has a current code status but no historical code status.      Advance Directive Documentation    Flowsheet Row Most Recent Value  Type of Advance Directive Living will, Healthcare Power of Attorney  Pre-existing out of facility DNR order (yellow form or pink MOST form) --  "MOST" Form in Place? --       Home/SNF/Other Home  Chief Complaint Acute bronchitis with COPD ([place]) [J44.0, J20.9]  Level of Care/Admitting Diagnosis ED Disposition     ED Disposition  Admit   Condition  --   Comment  Hospital Area: [place] [[id number]]  Level of Care: Stepdown [14]  Admit to SDU based on following criteria: Cardiac Instability:  Patients experiencing chest pain, unconfirmed MI and stable, arrhythmias and CHF requiring medical management and potentially compromising patients stability  Admit to SDU based on following criteria: Respiratory Distress:  Frequent assessment and/or intervention to maintain adequate ventilation/respiration, pulmonary toilet, and respiratory treatment.  May admit patient to [facility] or [facility] if equivalent level of care is available:: No  Covid Evaluation: Confirmed COVID Negative  Diagnosis: Acute bronchitis with COPD [facility]) [[id number]]  Admitting Physician: Nita Sells [phone]  Attending Physician: Nita Sells [phone]  Estimated length of stay: 3 - 4 days  Certification:: I certify this patient will need inpatient services for at least 2 midnights          Medical History Past Medical History:  Diagnosis Date   Breast cancer ([place])    lumpectomy, no chemo or XRT; 1 year of anastrazole   Cataract    COPD, severe  ([place]) 07/31/2016   Family history of adverse reaction to anesthesia    " my sisiter was unable to move for a while with the spinal anesthesia."   Hypothyroidism    nodule    Neuroendocrine carcinoma of lung ([place]) 10/26/2016   Nodule of left lung    lower lobe   Pneumonia    as an infant   Sebaceous cyst of labia 08/18/2018   Shortness of breath dyspnea    recently increased    Skin cancer    nose- tx. with [facility]    Thyroid nodule    Vaginal delivery    x2 /w  one being breech    Allergies Allergies  Allergen Reactions   Actonel [Risedronate Sodium] Other (See Comments)    indigestion    IV Location/Drains/Wounds Patient Lines/Drains/Airways Status     Active Line/Drains/Airways     Name Placement date Placement time Site Days   Peripheral IV 12/15/21 Anterior;Left Forearm 12/15/21  --  Forearm  less than 1   Incision (Closed) 07/23/16 Breast Bilateral 07/23/16  1355  -- 1971   Incision (Closed) 07/23/16 Breast Bilateral 07/23/16  1430  -- 1971            Labs/Imaging Results for orders placed or performed during the hospital encounter of 12/15/21 (from the past 48 hour(s))  Comprehensive metabolic panel     Status: Abnormal   Collection Time:  12/15/21  1:51 PM  Result Value Ref Range   Sodium 121 (L) 135 - 145 mmol/L   Potassium 3.5 3.5 - 5.1 mmol/L   Chloride 76 (L) 98 - 111 mmol/L   CO2 34 (H) 22 - 32 mmol/L   Glucose, Bld 110 (H) 70 - 99 mg/dL    Comment: Glucose reference range applies only to samples taken after fasting for at least 8 hours.   BUN 9 8 - 23 mg/dL   Creatinine, Ser 0.67 0.44 - 1.00 mg/dL   Calcium 8.7 (L) 8.9 - 10.3 mg/dL   Total Protein 7.2 6.5 - 8.1 g/dL   Albumin 4.1 3.5 - 5.0 g/dL   AST 23 15 - 41 U/L   ALT 20 0 - 44 U/L   Alkaline Phosphatase 81 38 - 126 U/L   Total Bilirubin 1.2 0.3 - 1.2 mg/dL   GFR, Estimated >60 >60 mL/min    Comment: (NOTE) Calculated using the CKD-EPI Creatinine Equation (2021)    Anion gap 11 5 -  15    Comment: Performed at [facility], [place] [address], [place], [place] [address]  CBC WITH DIFFERENTIAL     Status: Abnormal   Collection Time: 12/15/21  1:51 PM  Result Value Ref Range   WBC 13.3 (H) 4.0 - 10.5 K/uL   RBC 5.41 (H) 3.87 - 5.11 MIL/uL   Hemoglobin 16.0 (H) 12.0 - 15.0 g/dL   HCT 49.0 (H) 36.0 - 46.0 %   MCV 90.6 80.0 - 100.0 fL   MCH 29.6 26.0 - 34.0 pg   MCHC 32.7 30.0 - 36.0 g/dL   RDW 12.1 11.5 - 15.5 %   Platelets 312 150 - 400 K/uL   nRBC 0.0 0.0 - 0.2 %   Neutrophils Relative % 85 %   Neutro Abs 11.3 (H) 1.7 - 7.7 K/uL   Lymphocytes Relative 5 %   Lymphs Abs 0.7 0.7 - 4.0 K/uL   Monocytes Relative 9 %   Monocytes Absolute 1.2 (H) 0.1 - 1.0 K/uL   Eosinophils Relative 0 %   Eosinophils Absolute 0.0 0.0 - 0.5 K/uL   Basophils Relative 0 %   Basophils Absolute 0.0 0.0 - 0.1 K/uL   Immature Granulocytes 1 %   Abs Immature Granulocytes 0.06 0.00 - 0.07 K/uL    Comment: Performed at [facility], [place] [address], [place], [place] [address]  Brain natriuretic peptide     Status: None   Collection Time: 12/15/21  1:51 PM  Result Value Ref Range   B Natriuretic Peptide 52.7 0.0 - 100.0 pg/mL    Comment: Performed at [facility], [place] [address] [place], [place] [address]  Magnesium     Status: None   Collection Time: 12/15/21  1:51 PM  Result Value Ref Range   Magnesium 1.9 1.7 - 2.4 mg/dL    Comment: Performed at [facility], [place] [address] [place],  [address]  Blood gas, venous     Status: Abnormal   Collection Time: 12/15/21  1:51 PM  Result Value Ref Range   pH, Ven 7.281 7.250 - 7.430   pCO2, Ven 80.8 (HH) 44.0 - 60.0 mmHg    Comment: CRITICAL RESULT CALLED TO, READ BACK BY AND VERIFIED WITH: CALLED LESANE,D ON 12/15/2021 AT 1400 BY LUZOLO,P    pO2, Ven 36.5 32.0 - 45.0 mmHg   Bicarbonate 36.8 (H) 20.0 - 28.0 mmol/L   Acid-Base Excess 6.7 (H) 0.0 -  2.0 mmol/L    O2 Saturation 57.0 %   Patient temperature 98.6     Comment: Performed at [facility] [address] [place], [place] [address]  Resp Panel by RT-PCR (Flu A&B, Covid) Nasopharyngeal Swab     Status: None   Collection Time: 12/15/21  1:51 PM   Specimen: Nasopharyngeal Swab; Nasopharyngeal(NP) swabs in vial transport medium  Result Value Ref Range   SARS Coronavirus 2 by RT PCR NEGATIVE NEGATIVE    Comment: (NOTE) SARS-CoV-2 target nucleic acids are NOT DETECTED.  The SARS-CoV-2 RNA is generally detectable in upper respiratory specimens during the acute phase of infection. The lowest concentration of SARS-CoV-2 viral copies this assay can detect is 138 copies/mL. A negative result does not preclude SARS-Cov-2 infection and should not be used as the sole basis for treatment or other patient management decisions. A negative result may occur with  improper specimen collection/handling, submission of specimen other than nasopharyngeal swab, presence of viral mutation(s) within the areas targeted by this assay, and inadequate number of viral copies(<138 copies/mL). A negative result must be combined with clinical observations, patient history, and epidemiological information. The expected result is Negative.  Fact Sheet for Patients:  EntrepreneurPulse.com.au  Fact Sheet for Healthcare Providers:  IncredibleEmployment.be  This test is no t yet approved or cleared by the [place] FDA and  has been authorized for detection and/or diagnosis of SARS-CoV-2 by FDA under an Emergency Use Authorization (EUA). This EUA will remain  in effect (meaning this test can be used) for the duration of the COVID-19 declaration under Section 564(b)(1) of the Act, 21 U.S.C.section 360bbb-3(b)(1), unless the authorization is terminated  or revoked sooner.       Influenza A by PCR NEGATIVE NEGATIVE   Influenza B by PCR NEGATIVE NEGATIVE     Comment: (NOTE) The Xpert Xpress SARS-CoV-2/FLU/RSV plus assay is intended as an aid in the diagnosis of influenza from Nasopharyngeal swab specimens and should not be used as a sole basis for treatment. Nasal washings and aspirates are unacceptable for Xpert Xpress SARS-CoV-2/FLU/RSV testing.  Fact Sheet for Patients: EntrepreneurPulse.com.au  Fact Sheet for Healthcare Providers: IncredibleEmployment.be  This test is not yet approved or cleared by the [place] FDA and has been authorized for detection and/or diagnosis of SARS-CoV-2 by FDA under an Emergency Use Authorization (EUA). This EUA will remain in effect (meaning this test can be used) for the duration of the COVID-19 declaration under Section 564(b)(1) of the Act, 21 U.S.C. section 360bbb-3(b)(1), unless the authorization is terminated or revoked.  Performed at [facility], [place] [address], [place], [place] [address]    DG Chest Port 1 View  Result Date: 12/15/2021 CLINICAL DATA:  Dyspnea EXAM: PORTABLE CHEST 1 VIEW COMPARISON:  Chest radiograph dated November 05, 2021 FINDINGS: The heart size and mediastinal contours are within normal limits. Atherosclerotic calcification of aortic arch. Both lungs are clear. The visualized skeletal structures are unremarkable. IMPRESSION: No acute cardiopulmonary process. Electronically Signed   By: Keane Police D.O.   On: 12/15/2021 14:07    Pending Labs Unresulted Labs (From admission, onward)     Start     Ordered   12/16/21 0500  CBC  Tomorrow morning,   R        12/15/21 1606   12/16/21 0500  Comprehensive metabolic panel  Tomorrow morning,   R        12/15/21 1606   12/16/21 0500  Protime-INR  Tomorrow morning,  R        12/15/21 1606   12/15/21 1558  Blood gas, arterial (at [facility])  ONCE - STAT,   STAT        12/15/21 1557            Vitals/Pain Today's Vitals   12/15/21 1332 12/15/21 1357 12/15/21 1358  12/15/21 1445  BP:    104/77  Pulse:  (!) 113  92  Resp:  (!) 30  (!) 22  Temp:      TempSrc:      SpO2:  100% 100% 99%  Weight: 47.6 kg     Height: 5' (1.524 m)     PainSc: 0-No pain       Isolation Precautions No active isolations  Medications Medications  lactated ringers bolus 500 mL (has no administration in time range)  levothyroxine (SYNTHROID) tablet 50 mcg (has no administration in time range)  Ipratropium-Albuterol (COMBIVENT) respimat 1 puff (has no administration in time range)  Tiotropium Bromide Monohydrate AERS 2 puff (has no administration in time range)  ceFEPIme (MAXIPIME) 2 g in sodium chloride 0.9 % 100 mL IVPB (has no administration in time range)  budesonide (PULMICORT) nebulizer solution 2 mg (has no administration in time range)  methylPREDNISolone sodium succinate (SOLU-MEDROL) 40 mg/mL injection 40 mg (has no administration in time range)    Followed by  predniSONE (DELTASONE) tablet 40 mg (has no administration in time range)  enoxaparin (LOVENOX) injection 30 mg (has no administration in time range)  0.9 %  sodium chloride infusion (has no administration in time range)  umeclidinium bromide (INCRUSE ELLIPTA) 62.5 MCG/ACT 1 puff (has no administration in time range)  albuterol (PROVENTIL) (2.5 MG/3ML) 0.083% nebulizer solution (10 mg/hr Nebulization Given 12/15/21 1352)    Mobility non-ambulatory

## 2021-12-15 NOTE — Consult Note (Signed)
NAME:  Victoria Rice, MRN:  [id number], DOB:  Nov 05, 1931, LOS: 0 ADMISSION DATE:  12/15/2021, CONSULTATION DATE:  1/8 REFERRING MD:  Cresenciano Lick, CHIEF COMPLAINT:  sob    History of Present Illness:  43 yowf remote smoker with GOLD 3 copd dx 07/18/16 with reversibility and nl dlco but declined f/u by Dr Carlis Abbott and now admitted by [facility] with dx of aecopd and PCCM asked to eval pm 1/8 p declining referral 11/06/21 to return to pulmonary office for new dx of hypoxemic resp failure and breathing worse since ? Nov 2022 but esp x 10 d PTA      Seen several weeks PTA in oncology office was short of breath declined work-up-had recently been placed on Norvasc for blood pressure but this was complicated by lower extremity edema and hence was placed on Lasix-they discontinued Norvasc recently     She was wheezing apparently according to EMS given albuterol 04/07/2024 Solu-Medrol placed on 3 L nasal cannula  Pertinent  Medical History  Breast ca no chemo or RT LLL carcinoid on navigational bx 08/2016 followed by [facility] conservatively   Significant Hospital Events: Including procedures, antibiotic start and stop dates in addition to other pertinent events   Admit on Bipap with severe hypoxemic and hypercarbic Resp failure   Interim History / Subjective:  Feeling better since arrival, denies cough/congestion/ cp  Objective   Blood pressure 104/77, pulse 92, temperature 98.5 F (36.9 C), temperature source Oral, resp. rate (!) 22, height 5' (1.524 m), weight 47.6 kg, SpO2 99 %.    FiO2 (%):  [30 %] 30 %  No intake or output data in the 24 hours ending 12/15/21 1609 Filed Weights   12/15/21 1332  Weight: 47.6 kg    Examination: Tmax 98.5  General: elderly wf mild increased wob but comfortable off bipap  HENT: orophx clear  Lungs: distant bs faint pan exp wheeze Cardiovascular: RRR no s3  Abdomen: soft/ benign  Extremities: warm with 1+ sym pitting edema Neuro: a bit iffy on recent details of  care/ no focal deficits   I personally reviewed images and agree with radiology impression as follows:  CXR:   portable 1/8 No acute cardiopulmonary process.    Resolved Hospital Problem list     Assessment & Plan:  1) acute on chronic hypoxemic and hypercarbic resp failure likely bronchiectasis/AB and not typical copd  - quit smoking almost 34 y PTA and just started having symptoms in last 5 y with nl dlco at [facility] 2017  >>> r/o alpha one AT def, severe chronic asthma  >>> rx as aecopd for now but will need close outpt f/u to sort out her long term needs  >>> no need for bipap at this point, can use prn >>> needs TSH    2) Neuroendocrine tumor LLL dx by navigational bx [place], conservative f/u to date  3) moderately severe hyponatremia, w/u per [facility] ? Related to #3  4) periheral edema aggravated by use of amlodipine ? Incipient cor pulmonale  >>> check echo to compare to nl study 09/26/2019   Labs   CBC: Recent Labs  Lab 12/15/21 1351  WBC 13.3*  NEUTROABS 11.3*  HGB 16.0*  HCT 49.0*  MCV 90.6  PLT 673    Basic Metabolic Panel: Recent Labs  Lab 12/15/21 1351  NA 121*  K 3.5  CL 76*  CO2 34*  GLUCOSE 110*  BUN 9  CREATININE 0.67  CALCIUM 8.7*  MG 1.9   GFR:  Estimated Creatinine Clearance: 34.9 mL/min (by C-G formula based on SCr of 0.67 mg/dL). Recent Labs  Lab 12/15/21 1351  WBC 13.3*    Liver Function Tests: Recent Labs  Lab 12/15/21 1351  AST 23  ALT 20  ALKPHOS 81  BILITOT 1.2  PROT 7.2  ALBUMIN 4.1   No results for input(s): LIPASE, AMYLASE in the last 168 hours. No results for input(s): AMMONIA in the last 168 hours.  ABG    Component Value Date/Time   HCO3 36.8 (H) 12/15/2021 1351   O2SAT 57.0 12/15/2021 1351     Coagulation Profile: No results for input(s): INR, PROTIME in the last 168 hours.  Cardiac Enzymes: No results for input(s): CKTOTAL, CKMB, CKMBINDEX, TROPONINI in the last 168 hours.  HbA1C: No results  found for: HGBA1C  CBG: No results for input(s): GLUCAP in the last 168 hours.     Past Medical History:  She,  has a past medical history of Breast cancer ([place]), Cataract, COPD, severe ([place]) (07/31/2016), Family history of adverse reaction to anesthesia, Hypothyroidism, Neuroendocrine carcinoma of lung ([place]) (10/26/2016), Nodule of left lung, Pneumonia, Sebaceous cyst of labia (08/18/2018), Shortness of breath dyspnea, Skin cancer, Thyroid nodule, and Vaginal delivery.   Surgical History:   Past Surgical History:  Procedure Laterality Date   BREAST LUMPECTOMY Right 1970's   R breast   BREAST LUMPECTOMY Bilateral 07/23/2016   BREAST LUMPECTOMY WITH NEEDLE LOCALIZATION Left 07/23/2016   Procedure: LEFT BREAST LUMPECTOMY WITH DOUBLE NEEDLE LOCALIZATION;  Surgeon: Fanny Skates, MD;  Location: [place];  Service: General;  Laterality: Left;   BREAST LUMPECTOMY WITH RADIOACTIVE SEED LOCALIZATION Right 07/23/2016   Procedure: RIGHT BREAST LUMPECTOMY WITH RADIOACTIVE SEED LOCALIZATION;  Surgeon: Fanny Skates, MD;  Location: [place];  Service: General;  Laterality: Right;   CATARACT EXTRACTION W/ INTRAOCULAR LENS IMPLANT     right eye   EYE SURGERY Right    /w IOL- cataract removed    FUDUCIAL PLACEMENT N/A 08/25/2016   Procedure: PLACEMENT OF FUDUCIAL;  Surgeon: Melrose Nakayama, MD;  Location: [place];  Service: Thoracic;  Laterality: N/A;   VARICOSE VEIN SURGERY Right    w/o stitches on R leg   VIDEO BRONCHOSCOPY WITH ENDOBRONCHIAL NAVIGATION N/A 08/25/2016   Procedure: VIDEO BRONCHOSCOPY WITH ENDOBRONCHIAL NAVIGATION;  Surgeon: Melrose Nakayama, MD;  Location: [place];  Service: Thoracic;  Laterality: N/A;     Social History:   reports that she quit smoking about 47 years ago. Her smoking use included cigarettes. She has a 15.00 pack-year smoking history. She has never used smokeless tobacco. She reports current alcohol use of about 6.0 standard drinks per week. She reports that she does  not use drugs.   Family History:  Her family history includes Cystic fibrosis in her cousin; Lung cancer in her brother; Prostate cancer in her brother.   Allergies Allergies  Allergen Reactions   Actonel [Risedronate Sodium] Other (See Comments)    indigestion     Home Medications  Prior to Admission medications   Medication Sig Start Date End Date Taking? Authorizing Provider  amLODipine (NORVASC) 2.5 MG tablet Take 2.5 mg by mouth daily. 09/25/21   [provider]  cholecalciferol (VITAMIN D) 1000 units tablet Take 1,000 Units by mouth daily.    [provider]  furosemide (LASIX) 40 MG tablet Take 40 mg by mouth. Stopping due to leg cramps    [provider]  Ipratropium-Albuterol (COMBIVENT RESPIMAT) 20-100 MCG/ACT AERS respimat Inhale 1 puff  into the lungs every 6 (six) hours as needed for wheezing or shortness of breath. Patient not taking: Reported on 09/10/2021 10/04/19   [name], DO  levothyroxine (SYNTHROID, LEVOTHROID) 50 MCG tablet Take 50 mcg by mouth daily before breakfast.    [provider]  naproxen sodium (ANAPROX) 220 MG tablet Take 220 mg by mouth 2 (two) times daily as needed (for pain.).     [provider]  SPIRIVA RESPIMAT 2.5 MCG/ACT AERS Inhale 2 puffs into the lungs daily. 11/25/21   [provider]        Christinia Gully, MD [facility] [place] [phone]   After 7:00 pm call Elink  [phone]

## 2021-12-15 NOTE — ED Triage Notes (Signed)
Patient coming from home with c/o fluids retention and copd exacerbation. Albuterol 5 mg and 125 mg of solumedrol per ems. Patient on 3L [place].

## 2021-12-15 NOTE — Hospital Course (Signed)
88 year old community dwelling white female Left upper outer breast cancer status postlumpectomy 2017 on anastrozole Diagnosed with neuroendocrine tumor of lung/pulmonary carcinoid Severe prior COPD  with FEV1 of 0.83followed by Dr. Carlis Abbott [place]   PCO2 80 Sodium 121 BUN/creatinine 9/0.6 WBC 13 hemoglobin 16 CXR no acute abnormality COVID flu negative

## 2021-12-15 NOTE — H&P (Addendum)
HPI  Victoria Rice PRF:[id number] DOB: Aug 16, 1931 DOA: 12/15/2021  PCP: Faustino Congress, NP   Chief Complaint: Severe shortness of breath  HPI:  88 year old independent living dwelling white female Left upper outer breast cancer status postlumpectomy 2017 previously on anastrozole Diagnosed with neuroendocrine tumor of lung/pulmonary carcinoid Prior aberrant right subclavian artery Underlying PVCs, mild HFpEF based on echocardiogram 09/26/2019 EF 60 to 65% Severe prior COPD  with FEV1 of 0.4 followed by Dr. Sudie Grumbling  Seen several weeks ago in oncology office was short of breath declined work-up-had recently been placed on Norvasc for blood pressure but this was complicated by lower extremity edema and hence was placed on Lasix-they discontinued Norvasc recently  Report from family/patient-increasing shortness of breath no other new medications no anxiolytics pain meds recently She was so weak that they brought her to emergency room  She was wheezing apparently according to EMS given albuterol 04/07/2024 Solu-Medrol placed on 3 L nasal cannula   Review of Systems:   Pertinent +'s: Shortness of breath, wheeze, difficulty catching her breath  Also complains of some constipation Pertinent -"s: chest pain, nausea, vomiting, blurred vision, double vision, unilateral weakness, fall, rash, fever, chills, dysuria,  ED Course: Received boluses of fluid 500 cc, given albuterol neb and placed on BiPAP EKG pending troponin pending   Past Medical History:  Diagnosis Date   Breast cancer ([place])    lumpectomy, no chemo or XRT; 1 year of anastrazole   Cataract    COPD, severe ([place]) 07/31/2016   Family history of adverse reaction to anesthesia    " my sisiter was unable to move for a while with the spinal anesthesia."   Hypothyroidism    nodule    Neuroendocrine carcinoma of lung ([place]) 10/26/2016   Nodule of left lung    lower lobe   Pneumonia    as an infant   Sebaceous cyst  of labia 08/18/2018   Shortness of breath dyspnea    recently increased    Skin cancer    nose- tx. with [facility]    Thyroid nodule    Vaginal delivery    x2 /w  one being breech   Past Surgical History:  Procedure Laterality Date   BREAST LUMPECTOMY Right 1970's   R breast   BREAST LUMPECTOMY Bilateral 07/23/2016   BREAST LUMPECTOMY WITH NEEDLE LOCALIZATION Left 07/23/2016   Procedure: LEFT BREAST LUMPECTOMY WITH DOUBLE NEEDLE LOCALIZATION;  Surgeon: Fanny Skates, MD;  Location: [facility];  Service: General;  Laterality: Left;   BREAST LUMPECTOMY WITH RADIOACTIVE SEED LOCALIZATION Right 07/23/2016   Procedure: RIGHT BREAST LUMPECTOMY WITH RADIOACTIVE SEED LOCALIZATION;  Surgeon: Fanny Skates, MD;  Location: [place] [facility];  Service: General;  Laterality: Right;   CATARACT EXTRACTION W/ INTRAOCULAR LENS IMPLANT     right eye   EYE SURGERY Right    /w IOL- cataract removed    FUDUCIAL PLACEMENT N/A 08/25/2016   Procedure: PLACEMENT OF FUDUCIAL;  Surgeon: Melrose Nakayama, MD;  Location: [employer];  Service: Thoracic;  Laterality: N/A;   VARICOSE VEIN SURGERY Right    w/o stitches on R leg   VIDEO BRONCHOSCOPY WITH ENDOBRONCHIAL NAVIGATION N/A 08/25/2016   Procedure: VIDEO BRONCHOSCOPY WITH ENDOBRONCHIAL NAVIGATION;  Surgeon: Melrose Nakayama, MD;  Location: [place];  Service: Thoracic;  Laterality: N/A;    reports that she quit smoking about 47 years ago. Her smoking use included cigarettes. She has a 15.00 pack-year smoking history. She has never used smokeless tobacco. She  reports current alcohol use of about 6.0 standard drinks per week. She reports that she does not use drugs.  Mobility: Independent at baseline  Allergies  Allergen Reactions   Actonel [Risedronate Sodium] Other (See Comments)    indigestion   Family History  Problem Relation Age of Onset   Prostate cancer Brother    Lung cancer Brother    Cystic fibrosis Cousin    Prior to Admission medications   Medication  Sig Start Date End Date Taking? Authorizing Provider  amLODipine (NORVASC) 2.5 MG tablet Take 2.5 mg by mouth daily. 09/25/21   [provider]  cholecalciferol (VITAMIN D) 1000 units tablet Take 1,000 Units by mouth daily.    [provider]  furosemide (LASIX) 40 MG tablet Take 40 mg by mouth. Stopping due to leg cramps    [provider]  Ipratropium-Albuterol (COMBIVENT RESPIMAT) 20-100 MCG/ACT AERS respimat Inhale 1 puff into the lungs every 6 (six) hours as needed for wheezing or shortness of breath. Patient not taking: Reported on 09/10/2021 10/04/19   [name], DO  levothyroxine (SYNTHROID, LEVOTHROID) 50 MCG tablet Take 50 mcg by mouth daily before breakfast.    [provider]  naproxen sodium (ANAPROX) 220 MG tablet Take 220 mg by mouth 2 (two) times daily as needed (for pain.).     [provider]  SPIRIVA RESPIMAT 2.5 MCG/ACT AERS Inhale 2 puffs into the lungs daily. 11/25/21   [provider]    Physical Exam:  Vitals:   12/15/21 1358 12/15/21 1445  BP:  104/77  Pulse:  92  Resp:  (!) 22  Temp:    SpO2: 100% 99%    Awake coherent able to talk about BiPAP difficult to understand No icterus no pallor S1-S2 sinus, sinus tach cannot appreciate murmur Cannot assess oropharynx No wheeze although limited exam as is on the BiPAP Abdomen is soft no rebound no guarding Grade 1 lower extremity edema   I have personally reviewed following labs and imaging studies  Labs:  Work-up in ED showed ABG with PCO2 80 Sodium 121 BUN/creatinine 9/0.6 CO2 34 BNP 52  WBC 13 hemoglobin 16 CXR no acute abnormality COVID flu negative    Imaging studies:  Portable chest x-ray no acute findings  Medical tests:  EKG independently reviewed: Shows sinus tach no T wave inversions  Test discussed with performing physician: Discussed in detail with Dr. Doren Custard   Decision to obtain old records:  Yes reviewed   Review and summation  of old records:  y   Principal Problem:   Acute bronchitis with COPD ([place]) Active Problems:   Breast cancer of upper-outer quadrant of left female breast ([place])   COPD, severe ([place])   Neuroendocrine carcinoma of lung ([place])   Malignant neoplasm of unspecified part of unspecified bronchus or lung ([place])   Acute hypovolemic hyponatremia   Acute respiratory failure with hypercapnia ([place])   Assessment/Plan Acute type II respiratory failure with bronchitis/COPD exacerbation Continue BiPAP at this time Repeat ABG now and then determine if can come off for short period of time until CO2 levels are within normal range I have asked pulmonology to be on standby in case she decompensates as she is a full code See below discussion Given her relatively acute findings would also rule out cardiac causes obtain EKG and troponin BNP is negative arguing against heart failure Severe COPD FEV0.4 in 2017 Did not follow-up with pulmonary Give Solu-Medrol 40 twice daily at this time and then  transition to oral prednisone Start cefepime for 5 days Will require inhalers etc. Mild chest pain Patient states that the pain is present in the right posterolateral lung area She is not tachycardic so I do not think we are dealing with PE If it does not resolve we may scan her chest Acute hypovolemic hyponatremia Serum osmolality is 251 Will obtain urine sodium urine osmolality--I suspect she has been washing out her sodium with the use of Lasix although this is more common with thiazide diuretics Give NS75 cc/H as given sodium deficit of 489 --expect can correct deficit in about 48 hours Repeat urine sodium osmolality in a.m. to ensure adequately corrected otherwise obtain cortisol TSH Prior breast cancer, neuroendocrine tumor of the lung Outpatient follow-up with pulmonary/oncology as needed  Severity of Illness: The appropriate patient status for this patient is INPATIENT. Inpatient status is judged to be  reasonable and necessary in order to provide the required intensity of service to ensure the patient's safety. The patient's presenting symptoms, physical exam findings, and initial radiographic and laboratory data in the context of their chronic comorbidities is felt to place them at high risk for further clinical deterioration. Furthermore, it is not anticipated that the patient will be medically stable for discharge from the hospital within 2 midnights of admission.   * I certify that at the point of admission it is my clinical judgment that the patient will require inpatient hospital care spanning beyond 2 midnights from the point of admission due to high intensity of service, high risk for further deterioration and high frequency of surveillance required.*   DVT prophylaxis: Lovenox Code Status: Full Family Communication: Discussed with patient's daughter at the bedside Consults called: Did discuss with Dr. Melvyn Novas of critical care  Time spent: 58 minutes  Verlon Au, MD [days-call my NP partners at night for Care related issues] [facility] --Via amion app OR , [email or web address]; password [facility]  12/15/2021, 4:06 PM

## 2021-12-15 NOTE — Assessment & Plan Note (Signed)
Will need outpatient follow-up with oncologist was on anastrozole for year and is not on that now No further inpatient work-up

## 2021-12-15 NOTE — Progress Notes (Signed)
PHARMACY NOTE:  ANTIMICROBIAL RENAL DOSAGE ADJUSTMENT  Current antimicrobial regimen includes a mismatch between antimicrobial dosage and estimated renal function.  As per policy approved by the Pharmacy & Therapeutics and Medical Executive Committees, the antimicrobial dosage will be adjusted accordingly.  Current antimicrobial dosage:  Cefepime 2g IV q8h x 5 days  Indication: COPD exacerbation  Renal Function:  Estimated Creatinine Clearance: 34.9 mL/min (by C-G formula based on SCr of 0.67 mg/dL). []      On intermittent HD, scheduled: []      On CRRT    Antimicrobial dosage has been changed to:  Cefepime 2g IV q12h x 5 days   Thank you for allowing pharmacy to be a part of this patient's care.  Luiz Ochoa, [facility] 12/15/2021 6:52 PM

## 2021-12-15 NOTE — Assessment & Plan Note (Addendum)
Serum osmolality is 251 Will obtain urine sodium urine osmolality Give NS75 cc/H as given sodium deficit of 489 --expect can correct deficit in about 48 hours Repeat urine sodium osmolality in a.m. to ensure adequately corrected otherwise obtain cortisol TSH

## 2021-12-15 NOTE — Assessment & Plan Note (Signed)
Acute decompensation query cause-have to rule out cardiac equivalent so obtain EKG and troponin-BNP is 50 which argues against heart failure Start Solu-Medrol 40 twice daily and then transition to prednisone We will start cefepime given she has a severe exacerbation at home for the past She also has acute respiratory failure

## 2021-12-15 NOTE — ED Provider Notes (Signed)
[facility] DEPT Provider Note   CSN: [id number] Arrival date & time: 12/15/21  1306     History  No chief complaint on file.   Victoria Rice is a 88 y.o. female.  HPI Patient presents for shortness of breath.  Prior to arrival, she was given 125 mg Solu-Medrol and 5 mg of albuterol by EMS.  Currently on 3 L with supplemental oxygen.  She does not use home oxygen.  She has history of breast cancer (resolved), pulmonary carcinoid nodule (stable), CAD, COPD.  Per chart review, she was last seen by pulmonology 1.5 years ago.   She was noted to be mildly hypoxic during oncology visit 1 month ago.  She was recently prescribed amlodipine.  She had worsening leg swelling after this.  She currently takes 20 mg of Lasix daily.  She also does not daily Spiriva inhaler use.  She has a albuterol inhaler to take as needed.  She does not use nebulized breathing treatments at home.  Last echo was over 2 years ago.  She had mild diastolic dysfunction at that time.  She reports worsening shortness of breath over the past week.  She has also had pain in the posterolateral lower aspect of her right chest wall.  This pain is also been worsening.  Currently, patient lives alone in an apartment for senior citizens.    Home Medications Prior to Admission medications   Medication Sig Start Date End Date Taking? Authorizing Provider  albuterol (VENTOLIN HFA) 108 (90 Base) MCG/ACT inhaler Inhale 1-2 puffs into the lungs every 6 (six) hours as needed for wheezing or shortness of breath. 12/03/21  Yes [provider]  cholecalciferol (VITAMIN D) 1000 units tablet Take 1,000 Units by mouth daily.   Yes [provider]  furosemide (LASIX) 40 MG tablet Take 40 mg by mouth daily.   Yes [provider]  levothyroxine (SYNTHROID, LEVOTHROID) 50 MCG tablet Take 50 mcg by mouth daily before breakfast.   Yes [provider]  naproxen sodium (ANAPROX) 220 MG  tablet Take 220 mg by mouth 2 (two) times daily as needed (for pain.).    Yes [provider]  SPIRIVA RESPIMAT 2.5 MCG/ACT AERS Inhale 2 puffs into the lungs daily. 11/25/21  Yes [provider]  amLODipine (NORVASC) 2.5 MG tablet Take 2.5 mg by mouth daily. Patient not taking: Reported on 12/15/2021 09/25/21   [provider]  Ipratropium-Albuterol (COMBIVENT RESPIMAT) 20-100 MCG/ACT AERS respimat Inhale 1 puff into the lungs every 6 (six) hours as needed for wheezing or shortness of breath. Patient not taking: Reported on 09/10/2021 10/04/19   Julian Hy, DO      Allergies    Actonel [risedronate sodium]    Review of Systems   Review of Systems  Constitutional:  Positive for activity change, appetite change and fatigue. Negative for chills and fever.  HENT:  Negative for congestion, ear pain and sore throat.   Eyes:  Negative for pain and visual disturbance.  Respiratory:  Positive for cough, chest tightness, shortness of breath and wheezing.   Cardiovascular:  Positive for chest pain (Right lateral and posterior chest wall). Negative for palpitations and leg swelling.  Gastrointestinal:  Negative for abdominal pain, diarrhea, nausea and vomiting.  Genitourinary:  Negative for dysuria, flank pain, hematuria and pelvic pain.  Musculoskeletal:  Negative for arthralgias, back pain, myalgias and neck pain.  Skin:  Negative for color change and rash.  Neurological:  Negative for dizziness, seizures,  syncope, weakness, light-headedness, numbness and headaches.  Hematological:  Does not bruise/bleed easily.  Psychiatric/Behavioral:  Negative for confusion and decreased concentration.   All other systems reviewed and are negative.  Physical Exam Updated Vital Signs BP (!) 142/56    Pulse 71    Temp 98.3 F (36.8 C) (Oral)    Resp (!) 22    Ht 5' (1.524 m)    Wt 50.1 kg    SpO2 97%    BMI 21.57 kg/m  Physical Exam Vitals and nursing note reviewed.   Constitutional:      General: She is in acute distress.     Appearance: She is underweight. She is ill-appearing.  HENT:     Head: Normocephalic and atraumatic.     Right Ear: External ear normal.     Left Ear: External ear normal.     Nose: Nose normal.     Mouth/Throat:     Mouth: Mucous membranes are moist.     Pharynx: Oropharynx is clear.  Eyes:     General: No scleral icterus.    Extraocular Movements: Extraocular movements intact.     Conjunctiva/sclera: Conjunctivae normal.  Cardiovascular:     Rate and Rhythm: Regular rhythm. Tachycardia present.     Heart sounds: No murmur heard. Pulmonary:     Effort: Tachypnea, accessory muscle usage, prolonged expiration and respiratory distress present.     Breath sounds: Decreased air movement present. Wheezing present. No rhonchi.  Abdominal:     General: Abdomen is flat.     Palpations: Abdomen is soft.     Tenderness: There is no abdominal tenderness.  Musculoskeletal:        General: No swelling. Normal range of motion.     Cervical back: Normal range of motion and neck supple.     Right lower leg: No edema.     Left lower leg: No edema.  Skin:    General: Skin is warm and dry.     Coloration: Skin is not jaundiced or pale.  Neurological:     General: No focal deficit present.     Mental Status: She is alert and oriented to person, place, and time.     Cranial Nerves: No cranial nerve deficit.     Sensory: No sensory deficit.     Motor: No weakness.     Coordination: Coordination normal.  Psychiatric:        Mood and Affect: Mood normal.        Behavior: Behavior normal.        Thought Content: Thought content normal.        Judgment: Judgment normal.    ED Results / Procedures / Treatments   Labs (all labs ordered are listed, but only abnormal results are displayed) Labs Reviewed  COMPREHENSIVE METABOLIC PANEL - Abnormal; Notable for the following components:      Result Value   Sodium 121 (*)    Chloride 76  (*)    CO2 34 (*)    Glucose, Bld 110 (*)    Calcium 8.7 (*)    All other components within normal limits  CBC WITH DIFFERENTIAL/PLATELET - Abnormal; Notable for the following components:   WBC 13.3 (*)    RBC 5.41 (*)    Hemoglobin 16.0 (*)    HCT 49.0 (*)    Neutro Abs 11.3 (*)    Monocytes Absolute 1.2 (*)    All other components within normal limits  BLOOD GAS, VENOUS - Abnormal; Notable  for the following components:   pCO2, Ven 80.8 (*)    Bicarbonate 36.8 (*)    Acid-Base Excess 6.7 (*)    All other components within normal limits  RESP PANEL BY RT-PCR (FLU A&B, COVID) ARPGX2  MRSA NEXT GEN BY PCR, NASAL  BRAIN NATRIURETIC PEPTIDE  MAGNESIUM  CBC  COMPREHENSIVE METABOLIC PANEL  PROTIME-INR  TSH  TROPONIN I (HIGH SENSITIVITY)    EKG EKG Interpretation  Date/Time:  Sunday December 15 2021 13:15:00 EST Ventricular Rate:  94 PR Interval:  144 QRS Duration: 81 QT Interval:  353 QTC Calculation: 442 R Axis:   97 Text Interpretation: Sinus rhythm Biatrial enlargement Right axis deviation Confirmed by Godfrey Pick [phone]) on 12/16/2021 3:07:41 AM  Radiology DG Chest Port 1 View  Result Date: 12/15/2021 CLINICAL DATA:  Dyspnea EXAM: PORTABLE CHEST 1 VIEW COMPARISON:  Chest radiograph dated November 05, 2021 FINDINGS: The heart size and mediastinal contours are within normal limits. Atherosclerotic calcification of aortic arch. Both lungs are clear. The visualized skeletal structures are unremarkable. IMPRESSION: No acute cardiopulmonary process. Electronically Signed   By: Keane Police D.O.   On: 12/15/2021 14:07    Procedures Procedures    Medications Ordered in ED Medications  levothyroxine (SYNTHROID) tablet 50 mcg (has no administration in time range)  ipratropium-albuterol (DUONEB) 0.5-2.5 (3) MG/3ML nebulizer solution 3 mL (has no administration in time range)  methylPREDNISolone sodium succinate (SOLU-MEDROL) 40 mg/mL injection 40 mg (40 mg Intravenous Given 12/15/21  1652)    Followed by  predniSONE (DELTASONE) tablet 40 mg (has no administration in time range)  0.9 %  sodium chloride infusion ( Intravenous Infusion Verify 12/16/21 0000)  arformoterol (BROVANA) nebulizer solution 15 mcg (15 mcg Nebulization Given 12/15/21 2050)  Chlorhexidine Gluconate Cloth 2 % PADS 6 each (6 each Topical Given 12/15/21 1754)  chlorhexidine (PERIDEX) 0.12 % solution 15 mL (15 mLs Mouth Rinse Given 12/15/21 2136)  MEDLINE mouth rinse (has no administration in time range)  amLODipine (NORVASC) tablet 2.5 mg (2.5 mg Oral Given 12/15/21 2135)  albuterol (PROVENTIL) (2.5 MG/3ML) 0.083% nebulizer solution 2.5 mg (has no administration in time range)  metoprolol tartrate (LOPRESSOR) injection 5 mg (5 mg Intravenous Given 12/15/21 2338)  ceFEPIme (MAXIPIME) 2 g in sodium chloride 0.9 % 100 mL IVPB (0 g Intravenous Stopped 12/15/21 2016)  umeclidinium bromide (INCRUSE ELLIPTA) 62.5 MCG/ACT 1 puff (has no administration in time range)  budesonide (PULMICORT) nebulizer solution 1 mg (1 mg Nebulization Given 12/15/21 2050)  budesonide (PULMICORT) 0.5 MG/2ML nebulizer solution (  Not Given 12/15/21 2101)  enoxaparin (LOVENOX) injection 40 mg (has no administration in time range)  albuterol (PROVENTIL) (2.5 MG/3ML) 0.083% nebulizer solution (10 mg/hr Nebulization Given 12/15/21 1352)  lactated ringers bolus 500 mL ( Intravenous Stopped 12/15/21 1724)    ED Course/ Medical Decision Making/ A&P                           Medical Decision Making  This patient presents to the ED for concern of respiratory distress, this involves an extensive number of treatment options, and is a complaint that carries with it a high risk of complications and morbidity.  The differential diagnosis includes COPD exacerbation, pulmonary embolism, pneumonia, CHF, pneumothorax, ACS, lung cancer   Co morbidities that complicate the patient evaluation  Severe COPD, history of breast cancer   Additional history  obtained:  External records from outside source obtained and reviewed including EMR: Patient was  seen in oncology clinic for follow-up regarding her prior breast cancer on 11/29.  She was noted to be hypoxic during this visit.  She was advised to go to the ED at that time.  She declined to.  It does appear that she is likely lost to follow-up to pulmonology.   Lab Tests:  I Ordered, and personally interpreted labs.  The pertinent results include: Hyponatremia (121), hypochloremia, normal kidney function, chronically elevated bicarbonate, consistent with COPD, presence of leukocytosis, increased hemoglobin, consistent with chronic hypoxia, respiratory acidosis with hypercarbia of 80.8 on blood gas.  Troponins and BNP were normal, suggesting absence of cardiac etiology.   Imaging Studies ordered:  I ordered imaging studies including chest x-ray I independently visualized and interpreted imaging which showed absence of focal opacities or pulmonary edema. I agree with the radiologist interpretation   Cardiac Monitoring:  The patient was maintained on a cardiac monitor.  I personally viewed and interpreted the cardiac monitored which showed an underlying rhythm of: Sinus rhythm   Medicines ordered and prescription drug management:  I ordered medication including nebulized breathing treatment for COPD exacerbation Reevaluation of the patient after these medicines showed that the patient improved I have reviewed the patients home medicines and have made adjustments as needed   Test Considered:  CTA chest   Critical Interventions:  Patient arrives in the ED in respiratory distress.  She was initiated on BiPAP therapy with inline nebulized breathing treatment.  She received IV Solu-Medrol prior to arrival.  Problem List / ED Course:  87 year old female with history of severe COPD, presenting for respiratory distress and hypercarbic respiratory failure.  She is reportedly not on home  oxygen.  On arrival in the ED, patient is in respiratory distress with increased work of breathing, accessory muscle use, and very poor air movement on lung auscultation.  Prior to arrival, she did receive 1 DuoNeb breathing treatment as well as 125 mg of Solu-Medrol.  She was initiated on BiPAP with inline continued nebulized breathing treatments.  Lab work and chest x-ray were obtained.  Lab work was notable for respiratory acidosis on blood gas with a CO2 of 80.8.  Patient remained on BiPAP.  She was able to tolerate it well.  She was admitted to hospitalist for further management.   Reevaluation:  After the interventions noted above, I reevaluated the patient and found that they have :improved   Social Determinants of Health:  Patient appears to have resistance/possible denial of her chronic medical conditions.  When she was seen recently in oncology clinic, she was found to be hypoxic and advised to go to the ED.  She declined this.  She was subsequently advised to obtain close follow-up with pulmonology, which she has not obtained yet.  Although she arrived in respiratory distress and was started on BiPAP, she seems surprised that she would be admitted to the hospital.  She currently lives alone.  She would benefit from gentle discussions about her chronic conditions and need for optimize medical therapy to improve quality and duration of life.   Dispostion:  After consideration of the diagnostic results and the patients response to treatment, I feel that the patent would benefit from admission to hospital.    CRITICAL CARE Performed by: Godfrey Pick   Total critical care time: 35 minutes  Critical care time was exclusive of separately billable procedures and treating other patients.  Critical care was necessary to treat or prevent imminent or life-threatening deterioration.  Critical care was time spent  personally by me on the following activities: development of treatment plan with  patient and/or surrogate as well as nursing, discussions with consultants, evaluation of patient's response to treatment, examination of patient, obtaining history from patient or surrogate, ordering and performing treatments and interventions, ordering and review of laboratory studies, ordering and review of radiographic studies, pulse oximetry and re-evaluation of patient's condition.        Final Clinical Impression(s) / ED Diagnoses Final diagnoses:  Respiratory distress  Acute respiratory failure with hypoxia and hypercapnia [facility])    Rx / DC Orders ED Discharge Orders     None         Godfrey Pick, MD 12/16/21 [phone]

## 2021-12-16 DIAGNOSIS — J209 Acute bronchitis, unspecified: Secondary | ICD-10-CM | POA: Diagnosis not present

## 2021-12-16 DIAGNOSIS — J471 Bronchiectasis with (acute) exacerbation: Secondary | ICD-10-CM | POA: Diagnosis not present

## 2021-12-16 DIAGNOSIS — J441 Chronic obstructive pulmonary disease with (acute) exacerbation: Secondary | ICD-10-CM

## 2021-12-16 DIAGNOSIS — J9601 Acute respiratory failure with hypoxia: Secondary | ICD-10-CM | POA: Diagnosis not present

## 2021-12-16 DIAGNOSIS — J440 Chronic obstructive pulmonary disease with acute lower respiratory infection: Secondary | ICD-10-CM | POA: Diagnosis not present

## 2021-12-16 DIAGNOSIS — C7A090 Malignant carcinoid tumor of the bronchus and lung: Secondary | ICD-10-CM

## 2021-12-16 LAB — COMPREHENSIVE METABOLIC PANEL
ALT: 17 U/L (ref 0–44)
AST: 19 U/L (ref 15–41)
Albumin: 3.5 g/dL (ref 3.5–5.0)
Alkaline Phosphatase: 67 U/L (ref 38–126)
Anion gap: 9 (ref 5–15)
BUN: 11 mg/dL (ref 8–23)
CO2: 34 mmol/L — ABNORMAL HIGH (ref 22–32)
Calcium: 8.4 mg/dL — ABNORMAL LOW (ref 8.9–10.3)
Chloride: 81 mmol/L — ABNORMAL LOW (ref 98–111)
Creatinine, Ser: 0.45 mg/dL (ref 0.44–1.00)
GFR, Estimated: >60 mL/min (ref >60)
Glucose, Bld: 131 mg/dL — ABNORMAL HIGH (ref 70–99)
Potassium: 4.3 mmol/L (ref 3.5–5.1)
Sodium: 124 mmol/L — ABNORMAL LOW (ref 135–145)
Total Bilirubin: 0.8 mg/dL (ref 0.3–1.2)
Total Protein: 6 g/dL — ABNORMAL LOW (ref 6.5–8.1)

## 2021-12-16 LAB — RESPIRATORY PANEL BY PCR

## 2021-12-16 LAB — CBC
HCT: 44.1 % (ref 36.0–46.0)
Hemoglobin: 14.1 g/dL (ref 12.0–15.0)
MCH: 29.5 pg (ref 26.0–34.0)
MCHC: 32 g/dL (ref 30.0–36.0)
MCV: 92.3 fL (ref 80.0–100.0)
Platelets: 287 10*3/uL (ref 150–400)
RBC: 4.78 MIL/uL (ref 3.87–5.11)
RDW: 12.3 % (ref 11.5–15.5)
WBC: 5.7 10*3/uL (ref 4.0–10.5)
nRBC: 0 % (ref 0.0–0.2)

## 2021-12-16 LAB — TSH: TSH: 0.445 u[IU]/mL (ref 0.350–4.500)

## 2021-12-16 LAB — PROTIME-INR
INR: 1.1 (ref 0.8–1.2)
Prothrombin Time: 14.4 seconds (ref 11.4–15.2)

## 2021-12-16 MED ORDER — BUDESONIDE 0.5 MG/2ML IN SUSP
0.5000 mg | Freq: Two times a day (BID) | RESPIRATORY_TRACT | Status: DC
  Administered 2021-12-16 – 2021-12-30 (×28): 0.5 mg via RESPIRATORY_TRACT
  Filled 2021-12-16 (×29): qty 2

## 2021-12-16 MED ORDER — ENOXAPARIN SODIUM 40 MG/0.4ML IJ SOSY
40.0000 mg | PREFILLED_SYRINGE | INTRAMUSCULAR | Status: DC
  Administered 2021-12-16 – 2021-12-29 (×14): 40 mg via SUBCUTANEOUS
  Filled 2021-12-16 (×14): qty 0.4

## 2021-12-16 MED ORDER — GUAIFENESIN ER 600 MG PO TB12
600.0000 mg | ORAL_TABLET | Freq: Two times a day (BID) | ORAL | Status: DC
  Administered 2021-12-16 – 2021-12-30 (×24): 600 mg via ORAL
  Filled 2021-12-16 (×27): qty 1

## 2021-12-16 MED ORDER — REVEFENACIN 175 MCG/3ML IN SOLN
175.0000 ug | Freq: Every day | RESPIRATORY_TRACT | Status: DC
  Administered 2021-12-16 – 2021-12-30 (×13): 175 ug via RESPIRATORY_TRACT
  Filled 2021-12-16 (×15): qty 3

## 2021-12-16 MED ORDER — CEFDINIR 300 MG PO CAPS
300.0000 mg | ORAL_CAPSULE | Freq: Two times a day (BID) | ORAL | Status: AC
Start: 2021-12-16 — End: 2021-12-19
  Administered 2021-12-16 – 2021-12-18 (×4): 300 mg via ORAL
  Filled 2021-12-16 (×7): qty 1

## 2021-12-16 MED ORDER — METOPROLOL TARTRATE 5 MG/5ML IV SOLN
5.0000 mg | Freq: Four times a day (QID) | INTRAVENOUS | Status: DC | PRN
  Administered 2021-12-22 (×2): 5 mg via INTRAVENOUS
  Filled 2021-12-16 (×3): qty 5

## 2021-12-16 MED ORDER — ALBUTEROL SULFATE (2.5 MG/3ML) 0.083% IN NEBU
2.5000 mg | INHALATION_SOLUTION | RESPIRATORY_TRACT | Status: DC | PRN
  Administered 2021-12-17 – 2021-12-18 (×3): 2.5 mg via RESPIRATORY_TRACT
  Filled 2021-12-16 (×3): qty 3

## 2021-12-16 NOTE — Progress Notes (Addendum)
NAME:  Victoria Rice, MRN:  [id number], DOB:  Jul 14, 1931, LOS: 1 ADMISSION DATE:  12/15/2021, CONSULTATION DATE:  1/8 REFERRING MD:  Cresenciano Lick, CHIEF COMPLAINT:  sob    History of Present Illness:  54 yowf remote smoker with GOLD 3 copd dx 07/18/16 with reversibility and nl dlco but declined f/u by Dr Carlis Abbott and now admitted by triad with dx of aecopd and PCCM asked to eval pm 1/8 p declining referral 11/06/21 to return to pulmonary office for new dx of hypoxemic resp failure and breathing worse since ? Nov 2022 but esp x 10 d PTA      Seen several weeks PTA in oncology office was short of breath declined work-up-had recently been placed on Norvasc for blood pressure but this was complicated by lower extremity edema and hence was placed on Lasix-they discontinued Norvasc recently     She was wheezing apparently according to EMS given albuterol 04/07/2024 Solu-Medrol placed on 3 L nasal cannula  Pertinent  Medical History  Breast ca no chemo or RT LLL carcinoid on navigational bx 08/2016 followed by [facility] conservatively   Significant Hospital Events: Including procedures, antibiotic start and stop dates in addition to other pertinent events   Admit on Bipap with severe hypoxemic and hypercarbic Resp failure   Interim History / Subjective:  Continues to improve, did not need Bipap overnight, on 4L and comfortable this AM  Objective   Blood pressure (!) 139/51, pulse 63, temperature 98 F (36.7 C), temperature source Axillary, resp. rate 15, height 5' (1.524 m), weight 51.2 kg, SpO2 99 %.    FiO2 (%):  [30 %] 30 %   Intake/Output Summary (Last 24 hours) at 12/16/2021 0818 Last data filed at 12/16/2021 0865 Gross per 24 hour  Intake 1301.32 ml  Output 300 ml  Net 1001.32 ml   Filed Weights   12/15/21 1332 12/15/21 1749 12/16/21 0500  Weight: 47.6 kg 50.1 kg 51.2 kg    General:  eldlerly F, thin and chronically ill-appearing in no acute distress HEENT: MM pink/dry, sclera anicteric  and pupils equal, White in place Neuro: awake, appears anxious and is slightly slow to respond to questions, but responds appropriately and and is following commands CV: s1s2 , no m/r/g PULM:  no tachypnea or accessory muscle use on Welch, no rhonchi or significant wheezing bilaterally, slightly diminished bilateral bases GI: soft, non-tender  Extremities: warm/dry,  no edema  Skin: no rashes or lesions    Labs  TSH 0.445 Na 124 (121 yesterday) WBC 5.7  Resolved Hospital Problem list     Assessment & Plan:    Acute on Chronic hypoxic and hypercarbic respiratory failure Possibly secondary to bronchiectasis vs acute exacerbation of baseline severe COPD Covid and Flu negative On Bipap initially, but doing well on Elfrida O2 TSH WNL -Continue Predenisone 40mg  qd, Incruse Ellipta, Pulmicort and Brovana -had been resistant to long-acting inhalers at prior pulm visit -on Spriva and Combivent at baseline, may benefit from LAMA/LABA combination if she is open to this at discharge  -check extended RVP -discuss [place] when family arrives if open to this  Neuroendocrine tumor LLL  dx by navigational bx [place], conservative f/u to date -stable at last Oncology visit  Acute hyponatremia,  -management per primary, improved from 121-124, possibly secondary to Lasix, appears slightly volume down still today     Labs   CBC: Recent Labs  Lab 12/15/21 1351 12/16/21 0253  WBC 13.3* 5.7  NEUTROABS 11.3*  --  HGB 16.0* 14.1  HCT 49.0* 44.1  MCV 90.6 92.3  PLT 312 287     Basic Metabolic Panel: Recent Labs  Lab 12/15/21 1351 12/16/21 0253  NA 121* 124*  K 3.5 4.3  CL 76* 81*  CO2 34* 34*  GLUCOSE 110* 131*  BUN 9 11  CREATININE 0.67 0.45  CALCIUM 8.7* 8.4*  MG 1.9  --     GFR: Estimated Creatinine Clearance: 34.9 mL/min (by C-G formula based on SCr of 0.45 mg/dL). Recent Labs  Lab 12/15/21 1351 12/16/21 0253  WBC 13.3* 5.7     Liver Function Tests: Recent Labs  Lab  12/15/21 1351 12/16/21 0253  AST 23 19  ALT 20 17  ALKPHOS 81 67  BILITOT 1.2 0.8  PROT 7.2 6.0*  ALBUMIN 4.1 3.5    No results for input(s): LIPASE, AMYLASE in the last 168 hours. No results for input(s): AMMONIA in the last 168 hours.  ABG    Component Value Date/Time   HCO3 36.8 (H) 12/15/2021 1351   O2SAT 57.0 12/15/2021 1351      Coagulation Profile: Recent Labs  Lab 12/16/21 0253  INR 1.1    Cardiac Enzymes: No results for input(s): CKTOTAL, CKMB, CKMBINDEX, TROPONINI in the last 168 hours.  HbA1C: No results found for: HGBA1C  CBG: No results for input(s): GLUCAP in the last 168 hours.     Past Medical History:  She,  has a past medical history of Breast cancer ([place]), Cataract, COPD, severe ([place]) (07/31/2016), Family history of adverse reaction to anesthesia, Hypothyroidism, Neuroendocrine carcinoma of lung ([place]) (10/26/2016), Nodule of left lung, Pneumonia, Sebaceous cyst of labia (08/18/2018), Shortness of breath dyspnea, Skin cancer, Thyroid nodule, and Vaginal delivery.   Surgical History:   Past Surgical History:  Procedure Laterality Date   BREAST LUMPECTOMY Right 1970's   R breast   BREAST LUMPECTOMY Bilateral 07/23/2016   BREAST LUMPECTOMY WITH NEEDLE LOCALIZATION Left 07/23/2016   Procedure: LEFT BREAST LUMPECTOMY WITH DOUBLE NEEDLE LOCALIZATION;  Surgeon: Fanny Skates, MD;  Location: [facility];  Service: General;  Laterality: Left;   BREAST LUMPECTOMY WITH RADIOACTIVE SEED LOCALIZATION Right 07/23/2016   Procedure: RIGHT BREAST LUMPECTOMY WITH RADIOACTIVE SEED LOCALIZATION;  Surgeon: Fanny Skates, MD;  Location: [facility];  Service: General;  Laterality: Right;   CATARACT EXTRACTION W/ INTRAOCULAR LENS IMPLANT     right eye   EYE SURGERY Right    /w IOL- cataract removed    FUDUCIAL PLACEMENT N/A 08/25/2016   Procedure: PLACEMENT OF FUDUCIAL;  Surgeon: Melrose Nakayama, MD;  Location: [facility];  Service: Thoracic;  Laterality: N/A;   VARICOSE VEIN  SURGERY Right    w/o stitches on R leg   VIDEO BRONCHOSCOPY WITH ENDOBRONCHIAL NAVIGATION N/A 08/25/2016   Procedure: VIDEO BRONCHOSCOPY WITH ENDOBRONCHIAL NAVIGATION;  Surgeon: Melrose Nakayama, MD;  Location: [facility];  Service: Thoracic;  Laterality: N/A;     Social History:   reports that she quit smoking about 47 years ago. Her smoking use included cigarettes. She has a 15.00 pack-year smoking history. She has never used smokeless tobacco. She reports current alcohol use of about 6.0 standard drinks per week. She reports that she does not use drugs.   Family History:  Her family history includes Cystic fibrosis in her cousin; Lung cancer in her brother; Prostate cancer in her brother.   Allergies Allergies  Allergen Reactions   Actonel [Risedronate Sodium] Other (See Comments)    indigestion     Home Medications  Prior to Admission medications   Medication Sig Start Date End Date Taking? Authorizing Provider  amLODipine (NORVASC) 2.5 MG tablet Take 2.5 mg by mouth daily. 09/25/21   [provider]  cholecalciferol (VITAMIN D) 1000 units tablet Take 1,000 Units by mouth daily.    [provider]  furosemide (LASIX) 40 MG tablet Take 40 mg by mouth. Stopping due to leg cramps    [provider]  Ipratropium-Albuterol (COMBIVENT RESPIMAT) 20-100 MCG/ACT AERS respimat Inhale 1 puff into the lungs every 6 (six) hours as needed for wheezing or shortness of breath. Patient not taking: Reported on 09/10/2021 10/04/19   [name], DO  levothyroxine (SYNTHROID, LEVOTHROID) 50 MCG tablet Take 50 mcg by mouth daily before breakfast.    [provider]  naproxen sodium (ANAPROX) 220 MG tablet Take 220 mg by mouth 2 (two) times daily as needed (for pain.).     [provider]  SPIRIVA RESPIMAT 2.5 MCG/ACT AERS Inhale 2 puffs into the lungs daily. 11/25/21   [provider]       Otilio Carpen Malayiah Mcbrayer, PA-C [facility] See Amion for pager If no response to pager , please call [phone] until 7pm After 7:00 pm call Elink  [phone]?[place]

## 2021-12-16 NOTE — Evaluation (Signed)
Physical Therapy Evaluation Patient Details Name: Victoria Rice MRN: [id number] DOB: Feb 27, 1931 Today's Date: 12/16/2021  History of Present Illness  88 yo female admitted with acute resp failure 2* bronchiectasis, COPD exac. Hx of severe COPD  Clinical Impression  On eval, pt required Min A for mobility. She walked ~75 feet with a RW. O2 95% on 2L [place] with ambulation. Pt is unsteady and reported some lightheadedness with activity-BP WNL. Will plan to follow and progress activity as tolerated. Discussed d/c plan-pt is planning to return home where she lives alone. PT recommendation at this time is for HHPT. May benefit from a home health aide. May also have to consider ST SNF placement if pt doesn't continue to improve well enough to return home alone.      Recommendations for follow up therapy are one component of a multi-disciplinary discharge planning process, led by the attending physician.  Recommendations may be updated based on patient status, additional functional criteria and insurance authorization.  Follow Up Recommendations Home health PT    Assistance Recommended at Discharge    Patient can return home with the following  A little help with walking and/or transfers;A little help with bathing/dressing/bathroom;Assistance with cooking/housework    Equipment Recommendations Rolling walker (2 wheels)  Recommendations for Other Services       Functional Status Assessment Patient has had a recent decline in their functional status and demonstrates the ability to make significant improvements in function in a reasonable and predictable amount of time.     Precautions / Restrictions Precautions Precautions: Fall Precaution Comments: monitor vitals Restrictions Weight Bearing Restrictions: No      Mobility  Bed Mobility               General bed mobility comments: oob in recliner    Transfers Overall transfer level: Needs assistance Equipment used: Rolling  walker (2 wheels) Transfers: Sit to/from Stand Sit to Stand: Min assist           General transfer comment: Assist to rise, steady. Pt c/o lightheadedness.    Ambulation/Gait Ambulation/Gait assistance: Min assist Gait Distance (Feet): 75 Feet Assistive device: Rolling walker (2 wheels) Gait Pattern/deviations: Step-through pattern;Decreased stride length       General Gait Details: Cues for safety, pacing, proper use of RW. Assist to stabilize throughout distance. O2 95% on 2L, dyspena 2/4  Stairs            Wheelchair Mobility    Modified Rankin (Stroke Patients Only)       Balance Overall balance assessment: Needs assistance         Standing balance support: Bilateral upper extremity supported;Reliant on assistive device for balance Standing balance-Leahy Scale: Poor                               Pertinent Vitals/Pain Pain Assessment: No/denies pain    Home Living Family/patient expects to be discharged to:: Private residence Living Arrangements: Alone   Type of Home: Independent living facility ([facility])         Home Layout: One level Home Equipment: None      Prior Function Prior Level of Function : Independent/Modified Independent                     Hand Dominance        Extremity/Trunk Assessment   Upper Extremity Assessment Upper Extremity Assessment: Defer to OT evaluation  Lower Extremity Assessment Lower Extremity Assessment: Generalized weakness    Cervical / Trunk Assessment Cervical / Trunk Assessment: Normal  Communication   Communication: No difficulties  Cognition Arousal/Alertness: Awake/alert Behavior During Therapy: WFL for tasks assessed/performed Overall Cognitive Status: Within Functional Limits for tasks assessed                                          General Comments      Exercises     Assessment/Plan    PT Assessment Patient needs continued PT  services  PT Problem List Decreased strength;Decreased mobility;Decreased activity tolerance;Decreased balance;Decreased knowledge of use of DME       PT Treatment Interventions DME instruction;Gait training;Therapeutic activities;Therapeutic exercise;Patient/family education;Balance training;Functional mobility training    PT Goals (Current goals can be found in the Care Plan section)  Acute Rehab PT Goals Patient Stated Goal: back home PT Goal Formulation: With patient Time For Goal Achievement: 12/30/21 Potential to Achieve Goals: Good    Frequency Min 3X/week     Co-evaluation               AM-PAC PT "6 Clicks" Mobility  Outcome Measure Help needed turning from your back to your side while in a flat bed without using bedrails?: A Little Help needed moving from lying on your back to sitting on the side of a flat bed without using bedrails?: A Little Help needed moving to and from a bed to a chair (including a wheelchair)?: A Little Help needed standing up from a chair using your arms (e.g., wheelchair or bedside chair)?: A Little Help needed to walk in hospital room?: A Little Help needed climbing 3-5 steps with a railing? : A Lot 6 Click Score: 17    End of Session Equipment Utilized During Treatment: Gait belt;Oxygen Activity Tolerance: Patient tolerated treatment well Patient left: in chair;with call bell/phone within reach;with chair alarm set   PT Visit Diagnosis: Muscle weakness (generalized) (M62.81);Difficulty in walking, not elsewhere classified (R26.2)    Time: 1422-1450 PT Time Calculation (min) (ACUTE ONLY): 28 min   Charges:   PT Evaluation $PT Eval Moderate Complexity: 1 Mod PT Treatments $Gait Training: 8-22 mins          Doreatha Massed, PT Acute Rehabilitation  Office: [phone] Pager: [phone]

## 2021-12-16 NOTE — Progress Notes (Addendum)
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 Sudie Grumbling   Seen several weeks ago in oncology office was short of breath declined work-up-had recently been placed on Norvasc for blood pressure but this was complicated by lower extremity edema and hence was placed on Lasix-they discontinued Norvasc recently   Report from family/patient-increasing shortness of breath no other new medications no anxiolytics pain meds recently She was so weak that they brought her to emergency room there she was found to have PCO2 of 80 sodium 121 BUN/creatinine 9/0.6 BNP of 52 COVID was negative CXR no abnormality--  Hospital-Problem based course  Acute type II respiratory failure with bronchitis/COPD exacerbation Much improved and did not need BiPAP at the time of evaluation by critical care Empiric management Solu-Medrol etc. as below Follow RVP ordered by Pulm Severe COPD FEV0.4 in 2017 Continue Solu-Medrol 40--transitioned to oral prednisone by Pulm Cont cefepime today and transition to oral antibiotics 1/10 Continue albuterol every 6 as needed, Brovana 15 mcg twice daily, Pulmicort twice daily Will need outpatient spirometry FEV1 etc. Mild chest pain pleuritic and no overt concerns for DVT-no further scanning HTN Continue amlodipine 2.5, can use as needed metoprolol 5 mg every 6 as needed for pressure >170 Would not use diuretic for blood pressure control as she may washout her sodium  Acute hypovolemic hyponatremia Mild metabolic alkalosis likely  as compensation for her respiratory acidosis Serum osmolality is 251 urine sodium urine osmolality --never collected  Continue NS75 cc/H as given sodium deficit of 489 --sodium is steadily improving and should correct within 24 hours Prior breast cancer, neuroendocrine tumor of the lung Outpatient follow-up with pulmonary/oncology as needed  DVT prophylaxis: Lovenox Code Status: Full Family Communication: Called daughter Theola Sequin left voicemail for [phone] Disposition:  Status is: Inpatient  Remains inpatient appropriate because: Correction of electrolytes and respiratory issues     Consultants:  Pulmonology/critical care  Procedures:   Antimicrobials: Cefepime   Subjective: He is awake alert and looks somewhat comfortable on oxygen She has no chest pain No fever no chills no nausea-I think she understands some of what I am saying but she cannot repeat back to me  Objective: Vitals:   12/16/21 0800 12/16/21 0803 12/16/21 0805 12/16/21 0807  BP: (!) 139/51     Pulse: 63     Resp: 15     Temp:    98 F (36.7 C)  TempSrc:    Axillary  SpO2: 98% 99% 98% 99%  Weight:      Height:        Intake/Output Summary (Last 24 hours) at 12/16/2021 7169 Last data filed at 12/16/2021 6789 Gross per 24 hour  Intake 1301.32 ml  Output 300 ml  Net 1001.32 ml   Filed Weights   12/15/21 1332 12/15/21 1749 12/16/21 0500  Weight: 47.6 kg 50.1 kg 51.2 kg    Examination:  Coherent pleasant no distress EOMI NCAT frail cachectic white female CTA B no added sound no rales withdrawal Mild wheeze Abdomen soft no rebound no guarding No lower extremity edema S1-S2 sinus rhythm on monitor  Data Reviewed: personally reviewed  CBC    Component Value Date/Time   WBC 5.7 12/16/2021 0253   RBC 4.78 12/16/2021 0253   HGB 14.1 12/16/2021 0253   HGB 14.7 05/28/2016 1232   HCT 44.1 12/16/2021 0253   HCT 45.8 05/28/2016 1232   PLT 287 12/16/2021 0253   PLT 261 05/28/2016 1232    MCV 92.3 12/16/2021 0253   MCV 95.2 05/28/2016 1232   MCH 29.5 12/16/2021 0253   MCHC 32.0 12/16/2021 0253   RDW 12.3 12/16/2021 0253   RDW 13.0 05/28/2016 1232   LYMPHSABS 0.7 12/15/2021 1351   LYMPHSABS 1.5 05/28/2016 1232   MONOABS 1.2 (H) 12/15/2021 1351   MONOABS 0.5 05/28/2016 1232   EOSABS 0.0 12/15/2021 1351   EOSABS 0.1 05/28/2016 1232   BASOSABS 0.0 12/15/2021 1351   BASOSABS 0.0 05/28/2016 1232   CMP Latest Ref Rng & Units 12/16/2021 12/15/2021 08/02/2018  Glucose 70 - 99 mg/dL 131(H) 110(H) 91  BUN 8 - 23 mg/dL 11 9 15   Creatinine 0.44 - 1.00 mg/dL 0.45 0.67 0.72  Sodium 135 - 145 mmol/L 124(L) 121(L) 136  Potassium 3.5 - 5.1 mmol/L 4.3 3.5 4.4  Chloride 98 - 111 mmol/L 81(L) 76(L) 99  CO2 22 - 32 mmol/L 34(H) 34(H) 27  Calcium 8.9 - 10.3 mg/dL 8.4(L) 8.7(L) 9.0  Total Protein 6.5 - 8.1 g/dL 6.0(L) 7.2 -  Total Bilirubin 0.3 - 1.2 mg/dL 0.8 1.2 -  Alkaline Phos 38 - 126 U/L 67 81 -  AST 15 - 41 U/L 19 23 -  ALT 0 - 44 U/L 17 20 -     Radiology Studies: DG Chest Port 1 View  Result Date: 12/15/2021 CLINICAL DATA:  Dyspnea EXAM: PORTABLE CHEST 1 VIEW COMPARISON:  Chest radiograph dated November 05, 2021 FINDINGS: The heart size and mediastinal contours are within normal limits. Atherosclerotic calcification of aortic arch. Both lungs are clear. The visualized skeletal structures are unremarkable. IMPRESSION: No acute cardiopulmonary process. Electronically Signed   By: Keane Police D.O.   On: 12/15/2021 14:07     Scheduled Meds:  amLODipine  2.5 mg Oral Daily   arformoterol  15 mcg Nebulization BID   budesonide       budesonide (PULMICORT) nebulizer solution  1 mg Nebulization Q12H   chlorhexidine  15 mL Mouth Rinse BID   Chlorhexidine Gluconate Cloth  6 each Topical Daily   enoxaparin (LOVENOX) injection  40 mg Subcutaneous Q24H   levothyroxine  50 mcg Oral QAC breakfast   mouth rinse  15 mL Mouth Rinse q12n4p   metoprolol tartrate  5 mg Intravenous Q6H   [START  ON 12/17/2021] predniSONE  40 mg Oral Q breakfast   umeclidinium bromide  1 puff Inhalation Daily   Continuous Infusions:  sodium chloride 75 mL/hr at 12/16/21 0805   ceFEPime (MAXIPIME) IV 2 g (12/16/21 0807)     LOS: 1 day   Time spent: [place], MD [facility] To contact the attending provider between 7A-7P or the covering provider during after hours 7P-7A, please log into the web site [email or web address] and access using universal [place] password for that web site. If you do not have the password, please call the hospital operator.  12/16/2021, 8:23 AM

## 2021-12-17 DIAGNOSIS — J441 Chronic obstructive pulmonary disease with (acute) exacerbation: Secondary | ICD-10-CM | POA: Diagnosis not present

## 2021-12-17 DIAGNOSIS — J440 Chronic obstructive pulmonary disease with acute lower respiratory infection: Secondary | ICD-10-CM | POA: Diagnosis not present

## 2021-12-17 DIAGNOSIS — J209 Acute bronchitis, unspecified: Secondary | ICD-10-CM | POA: Diagnosis not present

## 2021-12-17 LAB — CBC WITH DIFFERENTIAL/PLATELET
Abs Immature Granulocytes: 0.09 10*3/uL — ABNORMAL HIGH (ref 0.00–0.07)
Basophils Absolute: 0 10*3/uL (ref 0.0–0.1)
Basophils Relative: 0 %
Eosinophils Absolute: 0 10*3/uL (ref 0.0–0.5)
Eosinophils Relative: 0 %
HCT: 44.7 % (ref 36.0–46.0)
Hemoglobin: 13.9 g/dL (ref 12.0–15.0)
Immature Granulocytes: 1 %
Lymphocytes Relative: 8 %
Lymphs Abs: 1 10*3/uL (ref 0.7–4.0)
MCH: 29.8 pg (ref 26.0–34.0)
MCHC: 31.1 g/dL (ref 30.0–36.0)
MCV: 95.7 fL (ref 80.0–100.0)
Monocytes Absolute: 1.6 10*3/uL — ABNORMAL HIGH (ref 0.1–1.0)
Monocytes Relative: 14 %
Neutro Abs: 9 10*3/uL — ABNORMAL HIGH (ref 1.7–7.7)
Neutrophils Relative %: 77 %
Platelets: 296 10*3/uL (ref 150–400)
RBC: 4.67 MIL/uL (ref 3.87–5.11)
RDW: 12.6 % (ref 11.5–15.5)
WBC: 11.7 10*3/uL — ABNORMAL HIGH (ref 4.0–10.5)
nRBC: 0 % (ref 0.0–0.2)

## 2021-12-17 LAB — COMPREHENSIVE METABOLIC PANEL
ALT: 19 U/L (ref 0–44)
AST: 18 U/L (ref 15–41)
Albumin: 3.5 g/dL (ref 3.5–5.0)
Alkaline Phosphatase: 62 U/L (ref 38–126)
Anion gap: 4 — ABNORMAL LOW (ref 5–15)
BUN: 17 mg/dL (ref 8–23)
CO2: 34 mmol/L — ABNORMAL HIGH (ref 22–32)
Calcium: 8.6 mg/dL — ABNORMAL LOW (ref 8.9–10.3)
Chloride: 85 mmol/L — ABNORMAL LOW (ref 98–111)
Creatinine, Ser: 0.59 mg/dL (ref 0.44–1.00)
GFR, Estimated: >60 mL/min (ref >60)
Glucose, Bld: 94 mg/dL (ref 70–99)
Potassium: 5 mmol/L (ref 3.5–5.1)
Sodium: 123 mmol/L — ABNORMAL LOW (ref 135–145)
Total Bilirubin: 0.7 mg/dL (ref 0.3–1.2)
Total Protein: 6 g/dL — ABNORMAL LOW (ref 6.5–8.1)

## 2021-12-17 LAB — SODIUM, URINE, RANDOM: Sodium, Ur: 24 mmol/L

## 2021-12-17 LAB — OSMOLALITY, URINE: Osmolality, Ur: 351 mOsm/kg (ref 300–900)

## 2021-12-17 MED ORDER — SODIUM CHLORIDE 1 G PO TABS
1.0000 g | ORAL_TABLET | Freq: Two times a day (BID) | ORAL | Status: DC
  Administered 2021-12-18 – 2021-12-30 (×21): 1 g via ORAL
  Filled 2021-12-17 (×26): qty 1

## 2021-12-17 NOTE — Progress Notes (Addendum)
NAME:  Victoria Rice, MRN:  [id number], DOB:  [birth year], LOS: 2 ADMISSION DATE:  12/15/2021, CONSULTATION DATE:  1/8 REFERRING MD:  Cresenciano Lick, CHIEF COMPLAINT:  sob    History of Present Illness:  88 yo female former smoker with hx of severe COPD and bronchiectasis presented with dyspnea and wheezing.  Started on Bipap and supplemental oxygen.  Pertinent  Medical History  Breast cancer, Hypothyroidism, Lt lower lobe carcinoid tumor  Studies:  PFT 07/18/16 >> FEV1 0.6 (42%), FEV1% 48, TLC 5.06 (117%), DLCO 80%, +BD Echo 09/26/19 >> EF 60 to 65% CT chest 09/10/21 >> smooth margin ovoid nodule LLL 1.9 x 1.0 cm, multiple smaller nodules stable, scattered cylindrical and varicose BTX mostly in RML and lingula  Interim History / Subjective:  Had trouble sleeping last night and had trouble controlling her bladder last night.  Breathing okay.  Not having cough and doesn't feel wheezy.  Objective   Blood pressure (!) 127/53, pulse 75, temperature 97.7 F (36.5 C), temperature source Oral, resp. rate 20, height 5' (1.524 m), weight 57.9 kg, SpO2 96 %.        Intake/Output Summary (Last 24 hours) at 12/17/2021 0811 Last data filed at 12/17/2021 0400 Gross per 24 hour  Intake 1588.4 ml  Output 400 ml  Net 1188.4 ml   Filed Weights   12/15/21 1749 12/16/21 0500 12/17/21 0500  Weight: 50.1 kg 51.2 kg 57.9 kg    General - alert Eyes - pupils reactive ENT - no sinus tenderness, no stridor Cardiac - regular rate/rhythm, no murmur Chest - decreased breath sounds b/l, no wheezing or rales Abdomen - soft, non tender, + bowel sounds Extremities - no cyanosis, clubbing, or edema Skin - no rashes Neuro - normal strength, moves extremities, follows commands Psych - normal mood and behavior  Assessment & Plan:   Acute hypoxic respiratory failure from COPD and bronchiectasis exacerbation. - adjust oxygen to keep SpO2 > 90%; assess for home oxygen needs prior to discharge - continue brovana,  pulmicort, yupelri - can transition to trelegy 100 one puff daily in place of triple nebulizer regimen when she is ready for discharge - prn albuterol - continue mucinex, flutter valve - wean off prednisone over next several days as tolerated - ABx per primary team   LLL carcinoid tumor. - previously followed by Dr. Roxan Hockey with thoracic surgery - pt opted at visit on 09/10/21 to forego any further radiographic follow up   Hyponatremia. - per primary team  Scheduled pulmonary office follow up with [place] in [address] office on 12/30/21 at 2 pm.  PCCM will sign off.  Please call if additional assistance needed while she is in hospital.  Labs    CMP Latest Ref Rng & Units 12/17/2021 12/16/2021 12/15/2021  Glucose 70 - 99 mg/dL 94 131(H) 110(H)  BUN 8 - 23 mg/dL 17 11 9   Creatinine 0.44 - 1.00 mg/dL 0.59 0.45 0.67  Sodium 135 - 145 mmol/L 123(L) 124(L) 121(L)  Potassium 3.5 - 5.1 mmol/L 5.0 4.3 3.5  Chloride 98 - 111 mmol/L 85(L) 81(L) 76(L)  CO2 22 - 32 mmol/L 34(H) 34(H) 34(H)  Calcium 8.9 - 10.3 mg/dL 8.6(L) 8.4(L) 8.7(L)  Total Protein 6.5 - 8.1 g/dL 6.0(L) 6.0(L) 7.2  Total Bilirubin 0.3 - 1.2 mg/dL 0.7 0.8 1.2  Alkaline Phos 38 - 126 U/L 62 67 81  AST 15 - 41 U/L 18 19 23   ALT 0 - 44 U/L 19 17 20  CBC Latest Ref Rng & Units 12/17/2021 12/16/2021 12/15/2021  WBC 4.0 - 10.5 K/uL 11.7(H) 5.7 13.3(H)  Hemoglobin 12.0 - 15.0 g/dL 13.9 14.1 16.0(H)  Hematocrit 36.0 - 46.0 % 44.7 44.1 49.0(H)  Platelets 150 - 400 K/uL 296 287 312   Signature:  Chesley Mires, MD [place] Pager - [phone] 12/17/2021, 8:15 AM

## 2021-12-17 NOTE — Evaluation (Signed)
Occupational Therapy Evaluation Patient Details Name: Victoria Rice MRN: [id number] DOB: 02/14/1931 Today's Date: 12/17/2021   History of Present Illness 88 yo female admitted with acute resp failure 2* bronchiectasis, COPD exac. Hx of severe COPD   Clinical Impression   Patient is an 88 year old female that typically lives alone in an [place] facility and does not use an assistive device. Can perform ADL tasks without assistance. Currently patient demonstrates impaired balance and a decrease in overall strength and activity tolerance limiting her ability to safely perform functional transfers and self care such as lower body dressing and bathing. Patient is currently a fall risk for out of bed mobility and reports she only has intermittent help from family and neighbors. Please see D/C recommendations listed below, Ot to follow during acute hospitalization.      Recommendations for follow up therapy are one component of a multi-disciplinary discharge planning process, led by the attending physician.  Recommendations may be updated based on patient status, additional functional criteria and insurance authorization.   Follow Up Recommendations  Skilled nursing-short term rehab (<3 hours/day)    Assistance Recommended at Discharge Frequent or constant Supervision/Assistance  Patient can return home with the following A little help with walking and/or transfers;A lot of help with bathing/dressing/bathroom    Functional Status Assessment  Patient has had a recent decline in their functional status and demonstrates the ability to make significant improvements in function in a reasonable and predictable amount of time.  Equipment Recommendations  None recommended by OT       Precautions / Restrictions Precautions Precautions: Fall Precaution Comments: monitor vitals Restrictions Weight Bearing Restrictions: No      Mobility Bed Mobility Overal bed mobility: Needs  Assistance Bed Mobility: Supine to Sit     Supine to sit: Min guard;HOB elevated     General bed mobility comments: min G for balance        Balance Overall balance assessment: Needs assistance Sitting-balance support: Feet supported Sitting balance-Leahy Scale: Fair Sitting balance - Comments: Static sitting able to maintain balance, loss of balance with dynamic   Standing balance support: Single extremity supported Standing balance-Leahy Scale: Poor Standing balance comment: Hand held assistance to chair, posterior loss of balance with turning.                           ADL either performed or assessed with clinical judgement   ADL Overall ADL's : Needs assistance/impaired Eating/Feeding: Independent;Sitting   Grooming: Wash/dry face;Set up;Sitting   Upper Body Bathing: Set up;Sitting   Lower Body Bathing: Maximal assistance;Sitting/lateral leans   Upper Body Dressing : Set up;Sitting   Lower Body Dressing: Total assistance;Sitting/lateral leans Lower Body Dressing Details (indicate cue type and reason): Attempts to don socks however has posterior loss of balance needing assist to regain to midline. Dyspnea with exertion and unable to don socks seated edge of bed Toilet Transfer: Minimal assistance;Stand-pivot;Cueing for safety Toilet Transfer Details (indicate cue type and reason): Patient with posterior loss of balance while turning to sit into recliner chair needing min A to steady. Cues for hand placement. Toileting- Clothing Manipulation and Hygiene: Minimal assistance;Sitting/lateral lean;Sit to/from stand       Functional mobility during ADLs: Minimal assistance;Cueing for safety General ADL Comments: Patient is requiring increased assistance with self care especially lower body dressing and functional transfers due to decreased balance and activity tolerance      Pertinent Vitals/Pain Pain Assessment:  No/denies pain     Hand Dominance  (did  not specify)   Extremity/Trunk Assessment Upper Extremity Assessment Upper Extremity Assessment: Generalized weakness   Lower Extremity Assessment Lower Extremity Assessment: Defer to PT evaluation   Cervical / Trunk Assessment Cervical / Trunk Assessment: Normal   Communication Communication Communication: No difficulties   Cognition Arousal/Alertness: Awake/alert Behavior During Therapy: WFL for tasks assessed/performed Overall Cognitive Status: Within Functional Limits for tasks assessed                                       General Comments  Desat to mid 80s on 2L however poor wave form.            Home Living Family/patient expects to be discharged to:: Private residence Living Arrangements: Alone Available Help at Discharge: Neighbor;Family;Available PRN/intermittently Type of Home: Independent living facility ([facility])       Home Layout: One level               Home Equipment: None          Prior Functioning/Environment Prior Level of Function : Independent/Modified Independent                        OT Problem List: Decreased strength;Decreased activity tolerance;Decreased safety awareness;Impaired balance (sitting and/or standing)      OT Treatment/Interventions: Self-care/ADL training;Therapeutic exercise;Energy conservation;DME and/or AE instruction;Therapeutic activities;Patient/family education;Balance training    OT Goals(Current goals can be found in the care plan section) Acute Rehab OT Goals Patient Stated Goal: get stronger OT Goal Formulation: With patient Time For Goal Achievement: 12/31/21 Potential to Achieve Goals: Good  OT Frequency: Min 2X/week       AM-PAC OT "6 Clicks" Daily Activity     Outcome Measure Help from another person eating meals?: None Help from another person taking care of personal grooming?: A Little Help from another person toileting, which includes using toliet, bedpan,  or urinal?: A Little Help from another person bathing (including washing, rinsing, drying)?: A Lot Help from another person to put on and taking off regular upper body clothing?: A Little Help from another person to put on and taking off regular lower body clothing?: A Lot 6 Click Score: 17   End of Session Equipment Utilized During Treatment: Oxygen Nurse Communication: Other (comment) (RN present during session)  Activity Tolerance: Patient tolerated treatment well Patient left: in chair;with call bell/phone within reach;with chair alarm set;with nursing/sitter in room  OT Visit Diagnosis: Unsteadiness on feet (R26.81);Other abnormalities of gait and mobility (R26.89);Muscle weakness (generalized) (M62.81)                Time: 4503-8882 OT Time Calculation (min): 15 min Charges:  OT General Charges $OT Visit: 1 Visit OT Evaluation $OT Eval Low Complexity: [place] OT OT pager: [phone]  Rosemary Holms 12/17/2021, 10:53 AM

## 2021-12-17 NOTE — Progress Notes (Signed)
Physical Therapy Treatment Patient Details Name: Victoria Rice MRN: [id number] DOB: 1930/12/11 Today's Date: 12/17/2021   History of Present Illness 88 yo female admitted with acute resp failure 2* bronchiectasis, COPD exac. Hx of severe COPD    PT Comments    Pt agreeable to working with PT. She required assistance to get on/off bsc then she walked to/from bathroom. O2: 85% on RA at rest, 93% on 2L with ambulation. Pt is mildly unsteady. Discussed d/c plan again on today-she is agreeable to [place] rehab at SNF prior to returning home where she lives alone.     Recommendations for follow up therapy are one component of a multi-disciplinary discharge planning process, led by the attending physician.  Recommendations may be updated based on patient status, additional functional criteria and insurance authorization.  Follow Up Recommendations  Skilled nursing-short term rehab (<3 hours/day)     Assistance Recommended at Discharge Frequent or constant Supervision/Assistance  Patient can return home with the following A little help with walking and/or transfers;A little help with bathing/dressing/bathroom;Assistance with cooking/housework   Equipment Recommendations  Rolling walker (2 wheels)    Recommendations for Other Services       Precautions / Restrictions Precautions Precautions: Fall Precaution Comments: monitor vitals Restrictions Weight Bearing Restrictions: No     Mobility  Bed Mobility Overal bed mobility: Needs Assistance Bed Mobility: Supine to Sit     Supine to sit: Min guard;HOB elevated     General bed mobility comments: oob in recliner    Transfers Overall transfer level: Needs assistance Equipment used: Rolling walker (2 wheels) Transfers: Sit to/from Stand Sit to Stand: Min guard           General transfer comment: Min guard for safety. Cues for safety, hand placement    Ambulation/Gait Ambulation/Gait assistance: Min assist Gait Distance  (Feet): 15 Feet Assistive device: Rolling walker (2 wheels) Gait Pattern/deviations: Step-through pattern;Decreased stride length       General Gait Details: Cues for safety, pacing, proper use of RW. Assist to stabilize throughout distance and to safely maneuver RW. O2 93% on 2L, dyspena 2/4   Stairs             Wheelchair Mobility    Modified Rankin (Stroke Patients Only)       Balance Overall balance assessment: Needs assistance Sitting-balance support: Feet supported Sitting balance-Leahy Scale: Fair Sitting balance - Comments: Static sitting able to maintain balance, loss of balance with dynamic   Standing balance support: Bilateral upper extremity supported;Reliant on assistive device for balance Standing balance-Leahy Scale: Poor Standing balance comment: Hand held assistance to chair, posterior loss of balance with turning.                            Cognition Arousal/Alertness: Awake/alert Behavior During Therapy: WFL for tasks assessed/performed Overall Cognitive Status: Within Functional Limits for tasks assessed                                          Exercises      General Comments General comments (skin integrity, edema, etc.): Desat to mid 80s on 2L however poor wave form.      Pertinent Vitals/Pain Pain Assessment: Faces Faces Pain Scale: Hurts little more Pain Location: stomach Pain Descriptors / Indicators: Discomfort Pain Intervention(s): Monitored during session;Limited activity within patient's tolerance  Home Living Family/patient expects to be discharged to:: Private residence Living Arrangements: Alone Available Help at Discharge: Neighbor;Family;Available PRN/intermittently Type of Home: Independent living facility ([facility])         Home Layout: One level Home Equipment: None      Prior Function            PT Goals (current goals can now be found in the care plan section)  Progress towards PT goals: Progressing toward goals    Frequency    Min 3X/week      PT Plan Discharge plan needs to be updated    Co-evaluation              AM-PAC PT "6 Clicks" Mobility   Outcome Measure  Help needed turning from your back to your side while in a flat bed without using bedrails?: A Little Help needed moving from lying on your back to sitting on the side of a flat bed without using bedrails?: A Little Help needed moving to and from a bed to a chair (including a wheelchair)?: A Little Help needed standing up from a chair using your arms (e.g., wheelchair or bedside chair)?: A Little Help needed to walk in hospital room?: A Little Help needed climbing 3-5 steps with a railing? : A Little 6 Click Score: 18    End of Session Equipment Utilized During Treatment: Oxygen Activity Tolerance: Patient tolerated treatment well Patient left: in chair;with call bell/phone within reach;with chair alarm set   PT Visit Diagnosis: Muscle weakness (generalized) (M62.81);Difficulty in walking, not elsewhere classified (R26.2)     Time: 1856-3149 PT Time Calculation (min) (ACUTE ONLY): 42 min  Charges:  $Gait Training: 8-22 mins $Therapeutic Activity: 8-22 mins                        Doreatha Massed, PT Acute Rehabilitation  Office: [phone] Pager: [phone]

## 2021-12-17 NOTE — Progress Notes (Addendum)
PROGRESS NOTE   Victoria Rice  EGB:[id number] DOB: [birth year] DOA: 12/15/2021 PCP: Faustino Congress, NP  Brief Narrative:   88 year old independent living dwelling white female Left upper outer breast cancer status postlumpectomy 2017 previously on anastrozole Diagnosed with neuroendocrine tumor of lung/pulmonary carcinoid Prior aberrant right subclavian artery Underlying PVCs, mild HFpEF based on echocardiogram 09/26/2019 EF 60 to 65% Severe prior COPD  with FEV1 of 0.4 followed by Dr. Sudie Grumbling   Seen several weeks ago in oncology office was short of breath declined work-up-had recently been placed on Norvasc for blood pressure but this was complicated by lower extremity edema and hence was placed on Lasix-they discontinued Norvasc recently   Report from family/patient-increasing shortness of breath no other new medications no anxiolytics pain meds recently She was so weak that they brought her to emergency room there she was found to have PCO2 of 80 sodium 121 BUN/creatinine 9/0.6 BNP of 52 COVID was negative CXR no abnormality--  Hospital-Problem based course  Acute type II respiratory failure with bronchitis/COPD exacerbation Transiently placed on BiPAP in the emergency room and discontinued Respiratory viral panel negative Currently on steroid taper Severe COPD FEV0.4 in 2017 Continue Solu-Medrol 40--transitioned to oral prednisone by Pulm Discontinued cefepime 1/9 ordered on admission and placed on cefdinir for another 3 days Continue albuterol every 6 as needed, Brovana 15 mcg twice daily, Pulmicort twice daily--pulmonary recommends Trelegy 1 puff daily on discharge to simplify things instead of all the other inhalers Will need outpatient spirometry FEV1 etc. Mild chest pain pleuritic and no overt concerns for DVT-no further scanning HTN Continue amlodipine 2.5, can use as needed metoprolol 5 mg every 6 as needed for pressure >170 Acute hypovolemic  hyponatremia--possibility of compnenet of SIADH Mild metabolic alkalosis likely as compensation for her respiratory acidosis Serum osmolality is 251 urine sodium urine osmolality --never collected --- have informed nursing to do the same Stop IVF Cut back free water and restrict fluid to 1500 cc--adding Salt tab 2 g bid Prior breast cancer, neuroendocrine tumor of the lung Outpatient follow-up with pulmonary/oncology as needed  DVT prophylaxis: Lovenox Code Status: Full Family Communication: Called daughter Theola Sequin left voicemail for [phone] on 1/10 Disposition:  Status is: Inpatient  Remains inpatient appropriate because: Correction of electrolytes and respiratory issues     Consultants:  Pulmonology/critical care  Procedures:   Antimicrobials: Cefepime   Subjective:  Awake coherent alert no distress Really has to go to the bathroom Is eating some Shortness of breath overall seems better  Objective: Vitals:   12/17/21 0500 12/17/21 0740 12/17/21 0742 12/17/21 1013  BP:    (!) 101/28  Pulse:      Resp:      Temp:   98.2 F (36.8 C)   TempSrc:   Oral   SpO2:  92% 96%   Weight: 57.9 kg     Height:        Intake/Output Summary (Last 24 hours) at 12/17/2021 1035 Last data filed at 12/17/2021 0400 Gross per 24 hour  Intake 1588.4 ml  Output 400 ml  Net 1188.4 ml    Filed Weights   12/15/21 1749 12/16/21 0500 12/17/21 0500  Weight: 50.1 kg 51.2 kg 57.9 kg    Examination:  Pleasant female no distress EOMI NCAT no focal deficit CTA B no wheeze no rales-slightly increased work of breathing--no rales or rhonchi Abdomen soft no rebound no guarding No lower extremity edema Neuro intact  Data Reviewed: personally reviewed   CBC  Component Value Date/Time   WBC 11.7 (H) 12/17/2021 0244   RBC 4.67 12/17/2021 0244   HGB 13.9 12/17/2021 0244   HGB 14.7 05/28/2016 1232   HCT 44.7 12/17/2021 0244   HCT 45.8 05/28/2016 1232   PLT 296 12/17/2021  0244   PLT 261 05/28/2016 1232   MCV 95.7 12/17/2021 0244   MCV 95.2 05/28/2016 1232   MCH 29.8 12/17/2021 0244   MCHC 31.1 12/17/2021 0244   RDW 12.6 12/17/2021 0244   RDW 13.0 05/28/2016 1232   LYMPHSABS 1.0 12/17/2021 0244   LYMPHSABS 1.5 05/28/2016 1232   MONOABS 1.6 (H) 12/17/2021 0244   MONOABS 0.5 05/28/2016 1232   EOSABS 0.0 12/17/2021 0244   EOSABS 0.1 05/28/2016 1232   BASOSABS 0.0 12/17/2021 0244   BASOSABS 0.0 05/28/2016 1232   CMP Latest Ref Rng & Units 12/17/2021 12/16/2021 12/15/2021  Glucose 70 - 99 mg/dL 94 131(H) 110(H)  BUN 8 - 23 mg/dL 17 11 9   Creatinine 0.44 - 1.00 mg/dL 0.59 0.45 0.67  Sodium 135 - 145 mmol/L 123(L) 124(L) 121(L)  Potassium 3.5 - 5.1 mmol/L 5.0 4.3 3.5  Chloride 98 - 111 mmol/L 85(L) 81(L) 76(L)  CO2 22 - 32 mmol/L 34(H) 34(H) 34(H)  Calcium 8.9 - 10.3 mg/dL 8.6(L) 8.4(L) 8.7(L)  Total Protein 6.5 - 8.1 g/dL 6.0(L) 6.0(L) 7.2  Total Bilirubin 0.3 - 1.2 mg/dL 0.7 0.8 1.2  Alkaline Phos 38 - 126 U/L 62 67 81  AST 15 - 41 U/L 18 19 23   ALT 0 - 44 U/L 19 17 20      Radiology Studies: DG Chest Port 1 View  Result Date: 12/15/2021 CLINICAL DATA:  Dyspnea EXAM: PORTABLE CHEST 1 VIEW COMPARISON:  Chest radiograph dated November 05, 2021 FINDINGS: The heart size and mediastinal contours are within normal limits. Atherosclerotic calcification of aortic arch. Both lungs are clear. The visualized skeletal structures are unremarkable. IMPRESSION: No acute cardiopulmonary process. Electronically Signed   By: Keane Police D.O.   On: 12/15/2021 14:07     Scheduled Meds:  amLODipine  2.5 mg Oral Daily   arformoterol  15 mcg Nebulization BID   budesonide (PULMICORT) nebulizer solution  0.5 mg Nebulization Q12H   cefdinir  300 mg Oral Q12H   chlorhexidine  15 mL Mouth Rinse BID   Chlorhexidine Gluconate Cloth  6 each Topical Daily   enoxaparin (LOVENOX) injection  40 mg Subcutaneous Q24H   guaiFENesin  600 mg Oral BID   levothyroxine  50 mcg Oral QAC  breakfast   mouth rinse  15 mL Mouth Rinse q12n4p   predniSONE  40 mg Oral Q breakfast   revefenacin  175 mcg Nebulization Daily   Continuous Infusions:  sodium chloride 75 mL/hr at 12/17/21 0400     LOS: 2 days   Time spent: [place], MD [facility] To contact the attending provider between 7A-7P or the covering provider during after hours 7P-7A, please log into the web site [email or web address] and access using universal [place] password for that web site. If you do not have the password, please call the hospital operator.  12/17/2021, 10:35 AM

## 2021-12-18 ENCOUNTER — Other Ambulatory Visit (HOSPITAL_COMMUNITY): Payer: Self-pay

## 2021-12-18 DIAGNOSIS — J209 Acute bronchitis, unspecified: Secondary | ICD-10-CM | POA: Diagnosis not present

## 2021-12-18 DIAGNOSIS — I700 Atherosclerosis of aorta: Secondary | ICD-10-CM

## 2021-12-18 DIAGNOSIS — J440 Chronic obstructive pulmonary disease with acute lower respiratory infection: Secondary | ICD-10-CM | POA: Diagnosis not present

## 2021-12-18 DIAGNOSIS — J9602 Acute respiratory failure with hypercapnia: Secondary | ICD-10-CM | POA: Diagnosis not present

## 2021-12-18 LAB — BASIC METABOLIC PANEL
Anion gap: 5 (ref 5–15)
BUN: 12 mg/dL (ref 8–23)
CO2: 32 mmol/L (ref 22–32)
Calcium: 8.3 mg/dL — ABNORMAL LOW (ref 8.9–10.3)
Chloride: 86 mmol/L — ABNORMAL LOW (ref 98–111)
Creatinine, Ser: 0.48 mg/dL (ref 0.44–1.00)
GFR, Estimated: >60 mL/min (ref >60)
Glucose, Bld: 116 mg/dL — ABNORMAL HIGH (ref 70–99)
Potassium: 4.9 mmol/L (ref 3.5–5.1)
Sodium: 123 mmol/L — ABNORMAL LOW (ref 135–145)

## 2021-12-18 LAB — BRAIN NATRIURETIC PEPTIDE: B Natriuretic Peptide: 168.7 pg/mL — ABNORMAL HIGH (ref 0.0–100.0)

## 2021-12-18 MED ORDER — GUAIFENESIN-DM 100-10 MG/5ML PO SYRP
5.0000 mL | ORAL_SOLUTION | ORAL | Status: DC | PRN
  Administered 2021-12-18 (×2): 5 mL via ORAL
  Filled 2021-12-18 (×2): qty 10

## 2021-12-18 MED ORDER — FUROSEMIDE 10 MG/ML IJ SOLN
20.0000 mg | Freq: Once | INTRAMUSCULAR | Status: AC
  Administered 2021-12-18: 20 mg via INTRAVENOUS
  Filled 2021-12-18: qty 2

## 2021-12-18 NOTE — TOC Progression Note (Signed)
Transition of Care [facility]) - Progression Note    Patient Details  Name: Victoria Rice MRN: [id number] Date of Birth: November 04, 1931  Transition of Care [facility]) CM/SW Contact  Purcell Mouton, RN Phone Number: 12/18/2021, 3:49 PM  Clinical Narrative:     TOC reviewed chart. Pt agree to SNF when stable. TOC Will continue to follow.  Expected Discharge Plan: [place] Barriers to Discharge: No Barriers Identified  Expected Discharge Plan and Services Expected Discharge Plan: [place] arrangements for the past 2 months: Single Family Home                                       Social Determinants of Health (SDOH) Interventions    Readmission Risk Interventions No flowsheet data found.

## 2021-12-18 NOTE — Progress Notes (Signed)
PROGRESS NOTE    Victoria Rice  NFA:[id number] DOB: [birth year] DOA: 12/15/2021 PCP: Faustino Congress, NP    Brief Narrative:  88 year old independent living dwelling white female Left upper outer breast cancer status postlumpectomy 2017 previously on anastrozole Diagnosed with neuroendocrine tumor of lung/pulmonary carcinoid Prior aberrant right subclavian artery Underlying PVCs, mild HFpEF based on echocardiogram 09/26/2019 EF 60 to 65% Severe prior COPD  with FEV1 of 0.4 followed by Dr. Sudie Grumbling   Seen several weeks ago in oncology office was short of breath declined work-up-had recently been placed on Norvasc for blood pressure but this was complicated by lower extremity edema and hence was placed on Lasix-they discontinued Norvasc recently   Report from family/patient-increasing shortness of breath no other new medications no anxiolytics pain meds recently She was so weak that they brought her to emergency room there she was found to have PCO2 of 80 sodium 121 BUN/creatinine 9/0.6 BNP of 15 COVID was negative CXR no abnormality--   Assessment & Plan:   Principal Problem:   Acute bronchitis with COPD ([place]) Active Problems:   Breast cancer of upper-outer quadrant of left female breast ([place])   COPD, severe ([place])   Neuroendocrine carcinoma of lung ([place])   Malignant neoplasm of unspecified part of unspecified bronchus or lung ([place])   Acute hypovolemic hyponatremia   Acute respiratory failure with hypercapnia ([place])  Acute type II respiratory failure with bronchitis/COPD exacerbation Earlier had required BiPAP in the emergency room Respiratory viral panel negative Currently on steroid taper Pt now weaned to [facility] this AM Severe COPD FEV0.4 in 2017 Now on prednisone per PCCM Discontinued cefepime 1/9 ordered on admission and placed on cefdinir for another 2 more days Continue albuterol every 6 as needed, Brovana 15 mcg twice daily, Pulmicort twice daily--pulmonary  recommends Trelegy 1 puff daily on discharge to simplify things instead of all the other inhalers Recommend outpatient spirometry FEV1 etc. Mild chest pain pleuritic and no overt concerns for DVT-no further scanning HTN Continue amlodipine 2.5, can use as needed metoprolol 5 mg every 6 as needed for pressure >170 Acute hypovolemic hyponatremia  Sodium remains stable at 123. Salt tabs were added  Serum osm 251 with urine osm around 350 with urine Na of 24 Mild metabolic alkalosis likely as compensation for her respiratory acidosis  Improving Prior breast cancer, neuroendocrine tumor of the lung Outpatient follow-up with pulmonary/oncology as needed  DVT prophylaxis: Lovenox subq Code Status: Full Family Communication: Pt in room, family not at bedside  Status is: Inpatient  Remains inpatient appropriate because: Severity of illness   Consultants:  PCCM  Procedures:    Antimicrobials: Anti-infectives (From admission, onward)    Start     Dose/Rate Route Frequency Ordered Stop   12/16/21 2200  cefdinir (OMNICEF) capsule 300 mg        300 mg Oral Every 12 hours 12/16/21 1628 12/19/21 2359   12/15/21 2000  ceFEPIme (MAXIPIME) 2 g in sodium chloride 0.9 % 100 mL IVPB  Status:  Discontinued        2 g 200 mL/hr over 30 Minutes Intravenous Every 12 hours 12/15/21 1852 12/16/21 1628   12/15/21 1615  ceFEPIme (MAXIPIME) 2 g in sodium chloride 0.9 % 100 mL IVPB  Status:  Discontinued        2 g 200 mL/hr over 30 Minutes Intravenous Every 8 hours 12/15/21 1606 12/15/21 1852       Subjective: Reports breathing somewhat better, still sob over baseline however  Objective:  Vitals:   12/18/21 1100 12/18/21 1209 12/18/21 1243 12/18/21 1300  BP:  (!) 149/62  (!) 109/39  Pulse: 82 89 94 94  Resp: (!) 28 (!) 22 20 (!) 25  Temp:  98.9 F (37.2 C)    TempSrc:  Oral    SpO2: 98% 99% 95% 95%  Weight:      Height:        Intake/Output Summary (Last 24 hours) at 12/18/2021 1403 Last  data filed at 12/18/2021 1330 Gross per 24 hour  Intake 1687.9 ml  Output 2730 ml  Net -1042.1 ml   Filed Weights   12/16/21 0500 12/17/21 0500 12/18/21 0438  Weight: 51.2 kg 57.9 kg 54.5 kg    Examination: General exam: Awake, laying in bed, in nad Respiratory system: Normal respiratory effort, no wheezing Cardiovascular system: regular rate, s1, s2 Gastrointestinal system: Soft, nondistended, positive BS Central nervous system: CN2-12 grossly intact, strength intact Extremities: Perfused, no clubbing Skin: Normal skin turgor, no notable skin lesions seen Psychiatry: Mood normal // no visual hallucinations   Data Reviewed: I have personally reviewed following labs and imaging studies  CBC: Recent Labs  Lab 12/15/21 1351 12/16/21 0253 12/17/21 0244  WBC 13.3* 5.7 11.7*  NEUTROABS 11.3*  --  9.0*  HGB 16.0* 14.1 13.9  HCT 49.0* 44.1 44.7  MCV 90.6 92.3 95.7  PLT 312 287 623   Basic Metabolic Panel: Recent Labs  Lab 12/15/21 1351 12/16/21 0253 12/17/21 0244 12/18/21 0315  NA 121* 124* 123* 123*  K 3.5 4.3 5.0 4.9  CL 76* 81* 85* 86*  CO2 34* 34* 34* 32  GLUCOSE 110* 131* 94 116*  BUN 9 11 17 12   CREATININE 0.67 0.45 0.59 0.48  CALCIUM 8.7* 8.4* 8.6* 8.3*  MG 1.9  --   --   --    GFR: Estimated Creatinine Clearance: 34.9 mL/min (by C-G formula based on SCr of 0.48 mg/dL). Liver Function Tests: Recent Labs  Lab 12/15/21 1351 12/16/21 0253 12/17/21 0244  AST 23 19 18   ALT 20 17 19   ALKPHOS 81 67 62  BILITOT 1.2 0.8 0.7  PROT 7.2 6.0* 6.0*  ALBUMIN 4.1 3.5 3.5   No results for input(s): LIPASE, AMYLASE in the last 168 hours. No results for input(s): AMMONIA in the last 168 hours. Coagulation Profile: Recent Labs  Lab 12/16/21 0253  INR 1.1   Cardiac Enzymes: No results for input(s): CKTOTAL, CKMB, CKMBINDEX, TROPONINI in the last 168 hours. BNP (last 3 results) No results for input(s): PROBNP in the last 8760 hours. HbA1C: No results for  input(s): HGBA1C in the last 72 hours. CBG: No results for input(s): GLUCAP in the last 168 hours. Lipid Profile: No results for input(s): CHOL, HDL, LDLCALC, TRIG, CHOLHDL, LDLDIRECT in the last 72 hours. Thyroid Function Tests: Recent Labs    12/16/21 0253  TSH 0.445   Anemia Panel: No results for input(s): VITAMINB12, FOLATE, FERRITIN, TIBC, IRON, RETICCTPCT in the last 72 hours. Sepsis Labs: No results for input(s): PROCALCITON, LATICACIDVEN in the last 168 hours.  Recent Results (from the past 240 hour(s))  Resp Panel by RT-PCR (Flu A&B, Covid) Nasopharyngeal Swab     Status: None   Collection Time: 12/15/21  1:51 PM   Specimen: Nasopharyngeal Swab; Nasopharyngeal(NP) swabs in vial transport medium  Result Value Ref Range Status   SARS Coronavirus 2 by RT PCR NEGATIVE NEGATIVE Final    Comment: (NOTE) SARS-CoV-2 target nucleic acids are NOT DETECTED.  The SARS-CoV-2 RNA  is generally detectable in upper respiratory specimens during the acute phase of infection. The lowest concentration of SARS-CoV-2 viral copies this assay can detect is 138 copies/mL. A negative result does not preclude SARS-Cov-2 infection and should not be used as the sole basis for treatment or other patient management decisions. A negative result may occur with  improper specimen collection/handling, submission of specimen other than nasopharyngeal swab, presence of viral mutation(s) within the areas targeted by this assay, and inadequate number of viral copies(<138 copies/mL). A negative result must be combined with clinical observations, patient history, and epidemiological information. The expected result is Negative.  Fact Sheet for Patients:  EntrepreneurPulse.com.au  Fact Sheet for Healthcare Providers:  IncredibleEmployment.be  This test is no t yet approved or cleared by the [place] FDA and  has been authorized for detection and/or diagnosis of  SARS-CoV-2 by FDA under an Emergency Use Authorization (EUA). This EUA will remain  in effect (meaning this test can be used) for the duration of the COVID-19 declaration under Section 564(b)(1) of the Act, 21 U.S.C.section 360bbb-3(b)(1), unless the authorization is terminated  or revoked sooner.       Influenza A by PCR NEGATIVE NEGATIVE Final   Influenza B by PCR NEGATIVE NEGATIVE Final    Comment: (NOTE) The Xpert Xpress SARS-CoV-2/FLU/RSV plus assay is intended as an aid in the diagnosis of influenza from Nasopharyngeal swab specimens and should not be used as a sole basis for treatment. Nasal washings and aspirates are unacceptable for Xpert Xpress SARS-CoV-2/FLU/RSV testing.  Fact Sheet for Patients: EntrepreneurPulse.com.au  Fact Sheet for Healthcare Providers: IncredibleEmployment.be  This test is not yet approved or cleared by the [place] FDA and has been authorized for detection and/or diagnosis of SARS-CoV-2 by FDA under an Emergency Use Authorization (EUA). This EUA will remain in effect (meaning this test can be used) for the duration of the COVID-19 declaration under Section 564(b)(1) of the Act, 21 U.S.C. section 360bbb-3(b)(1), unless the authorization is terminated or revoked.  Performed at [facility], [place] [address], [place], [place] [address]   MRSA Next Gen by PCR, Nasal     Status: None   Collection Time: 12/15/21  5:44 PM   Specimen: Nasal Mucosa; Nasal Swab  Result Value Ref Range Status   MRSA by PCR Next Gen NOT DETECTED NOT DETECTED Final    Comment: (NOTE) The GeneXpert MRSA Assay (FDA approved for NASAL specimens only), is one component of a comprehensive MRSA colonization surveillance program. It is not intended to diagnose MRSA infection nor to guide or monitor treatment for MRSA infections. Test performance is not FDA approved in patients less than 41 years old. Performed at  [facility], [place] [address], [place], [place] [address]   Respiratory (~20 pathogens) panel by PCR     Status: None   Collection Time: 12/16/21 12:09 PM   Specimen: Nasopharyngeal Swab; Respiratory  Result Value Ref Range Status   Adenovirus NOT DETECTED NOT DETECTED Final   Coronavirus 229E NOT DETECTED NOT DETECTED Final    Comment: (NOTE) The Coronavirus on the Respiratory Panel, DOES NOT test for the novel  Coronavirus (2019 nCoV)    Coronavirus HKU1 NOT DETECTED NOT DETECTED Final   Coronavirus NL63 NOT DETECTED NOT DETECTED Final   Coronavirus OC43 NOT DETECTED NOT DETECTED Final   Metapneumovirus NOT DETECTED NOT DETECTED Final   Rhinovirus / Enterovirus NOT DETECTED NOT DETECTED Final   Influenza A NOT DETECTED NOT DETECTED Final  Influenza B NOT DETECTED NOT DETECTED Final   Parainfluenza Virus 1 NOT DETECTED NOT DETECTED Final   Parainfluenza Virus 2 NOT DETECTED NOT DETECTED Final   Parainfluenza Virus 3 NOT DETECTED NOT DETECTED Final   Parainfluenza Virus 4 NOT DETECTED NOT DETECTED Final   Respiratory Syncytial Virus NOT DETECTED NOT DETECTED Final   Bordetella pertussis NOT DETECTED NOT DETECTED Final   Bordetella Parapertussis NOT DETECTED NOT DETECTED Final   Chlamydophila pneumoniae NOT DETECTED NOT DETECTED Final   Mycoplasma pneumoniae NOT DETECTED NOT DETECTED Final    Comment: Performed at [facility], [place] [address], [place], [place] [address]     Radiology Studies: No results found.  Scheduled Meds:  amLODipine  2.5 mg Oral Daily   arformoterol  15 mcg Nebulization BID   budesonide (PULMICORT) nebulizer solution  0.5 mg Nebulization Q12H   cefdinir  300 mg Oral Q12H   chlorhexidine  15 mL Mouth Rinse BID   Chlorhexidine Gluconate Cloth  6 each Topical Daily   enoxaparin (LOVENOX) injection  40 mg Subcutaneous Q24H   guaiFENesin  600 mg Oral BID   levothyroxine  50 mcg Oral QAC breakfast   mouth rinse  15 mL Mouth Rinse  q12n4p   predniSONE  40 mg Oral Q breakfast   revefenacin  175 mcg Nebulization Daily   sodium chloride  1 g Oral BID WC   Continuous Infusions:   LOS: 3 days   Marylu Lund, MD [facility] Pager On Amion  If 7PM-7AM, please contact night-coverage 12/18/2021, 2:03 PM

## 2021-12-18 NOTE — TOC Benefit Eligibility Note (Signed)
Patient Teacher, English as a foreign language completed.    The patient is currently admitted and upon discharge could be taking Breztri 160 mcg.  The current 30 day co-pay is, $40.00.   The patient is currently admitted and upon discharge could be taking Trelegy Ellipta 100 mcg.  The current 30 day co-pay is, $40.00.   The patient is currently admitted and upon discharge could be taking Trelegy Ellipta 200 mcg.  The current 30 day co-pay is, $40.00.   The patient is insured through [place], [place] Patient Advocate Specialist [facility] Patient Advocate Team Direct Number: [phone]  Fax: [phone]

## 2021-12-19 ENCOUNTER — Inpatient Hospital Stay (HOSPITAL_COMMUNITY): Payer: Medicare PPO

## 2021-12-19 DIAGNOSIS — J209 Acute bronchitis, unspecified: Secondary | ICD-10-CM | POA: Diagnosis not present

## 2021-12-19 DIAGNOSIS — J9601 Acute respiratory failure with hypoxia: Secondary | ICD-10-CM

## 2021-12-19 DIAGNOSIS — E871 Hypo-osmolality and hyponatremia: Secondary | ICD-10-CM | POA: Diagnosis not present

## 2021-12-19 DIAGNOSIS — J440 Chronic obstructive pulmonary disease with acute lower respiratory infection: Secondary | ICD-10-CM | POA: Diagnosis not present

## 2021-12-19 LAB — COMPREHENSIVE METABOLIC PANEL
ALT: 19 U/L (ref 0–44)
AST: 14 U/L — ABNORMAL LOW (ref 15–41)
Albumin: 3.3 g/dL — ABNORMAL LOW (ref 3.5–5.0)
Alkaline Phosphatase: 56 U/L (ref 38–126)
Anion gap: 4 — ABNORMAL LOW (ref 5–15)
BUN: 11 mg/dL (ref 8–23)
CO2: 40 mmol/L — ABNORMAL HIGH (ref 22–32)
Calcium: 8.5 mg/dL — ABNORMAL LOW (ref 8.9–10.3)
Chloride: 84 mmol/L — ABNORMAL LOW (ref 98–111)
Creatinine, Ser: 0.51 mg/dL (ref 0.44–1.00)
GFR, Estimated: >60 mL/min (ref >60)
Glucose, Bld: 111 mg/dL — ABNORMAL HIGH (ref 70–99)
Potassium: 4.8 mmol/L (ref 3.5–5.1)
Sodium: 128 mmol/L — ABNORMAL LOW (ref 135–145)
Total Bilirubin: 0.6 mg/dL (ref 0.3–1.2)
Total Protein: 5.7 g/dL — ABNORMAL LOW (ref 6.5–8.1)

## 2021-12-19 LAB — BLOOD GAS, ARTERIAL
Acid-Base Excess: 9.3 mmol/L — ABNORMAL HIGH (ref 0.0–2.0)
Bicarbonate: 41.7 mmol/L — ABNORMAL HIGH (ref 20.0–28.0)
O2 Saturation: 99.4 %
Patient temperature: 98.6
pCO2 arterial: 106 mmHg (ref 32.0–48.0)
pH, Arterial: 7.218 — ABNORMAL LOW (ref 7.350–7.450)
pO2, Arterial: 169 mmHg — ABNORMAL HIGH (ref 83.0–108.0)

## 2021-12-19 LAB — CBC
HCT: 46.1 % — ABNORMAL HIGH (ref 36.0–46.0)
Hemoglobin: 13.8 g/dL (ref 12.0–15.0)
MCH: 29.6 pg (ref 26.0–34.0)
MCHC: 29.9 g/dL — ABNORMAL LOW (ref 30.0–36.0)
MCV: 98.7 fL (ref 80.0–100.0)
Platelets: 271 10*3/uL (ref 150–400)
RBC: 4.67 MIL/uL (ref 3.87–5.11)
RDW: 12.5 % (ref 11.5–15.5)
WBC: 10.2 10*3/uL (ref 4.0–10.5)
nRBC: 0 % (ref 0.0–0.2)

## 2021-12-19 MED ORDER — ACETAMINOPHEN 325 MG PO TABS: 650.0000 mg | ORAL_TABLET | Freq: Four times a day (QID) | ORAL | Status: DC | PRN

## 2021-12-19 MED ORDER — PANTOPRAZOLE SODIUM 40 MG IV SOLR
40.0000 mg | Freq: Once | INTRAVENOUS | Status: AC
  Administered 2021-12-19: 40 mg via INTRAVENOUS
  Filled 2021-12-19: qty 40

## 2021-12-19 MED ORDER — PROCHLORPERAZINE EDISYLATE 10 MG/2ML IJ SOLN
5.0000 mg | Freq: Once | INTRAMUSCULAR | Status: AC
Start: 2021-12-19 — End: 2021-12-19
  Administered 2021-12-19: 5 mg via INTRAVENOUS
  Filled 2021-12-19: qty 2

## 2021-12-19 MED ORDER — ONDANSETRON HCL 4 MG/2ML IJ SOLN
4.0000 mg | Freq: Four times a day (QID) | INTRAMUSCULAR | Status: DC | PRN
  Administered 2021-12-19 – 2021-12-20 (×3): 4 mg via INTRAVENOUS
  Filled 2021-12-19 (×3): qty 2

## 2021-12-19 MED ORDER — ACETAMINOPHEN 650 MG RE SUPP: 650.0000 mg | Freq: Four times a day (QID) | RECTAL | Status: DC | PRN

## 2021-12-19 MED ORDER — ONDANSETRON HCL 4 MG/2ML IJ SOLN
4.0000 mg | Freq: Once | INTRAMUSCULAR | Status: AC
  Administered 2021-12-19: 4 mg via INTRAVENOUS
  Filled 2021-12-19: qty 2

## 2021-12-19 MED ORDER — METHYLPREDNISOLONE SODIUM SUCC 40 MG IJ SOLR
40.0000 mg | INTRAMUSCULAR | Status: DC
  Administered 2021-12-19 – 2021-12-20 (×2): 40 mg via INTRAVENOUS
  Filled 2021-12-19 (×2): qty 1

## 2021-12-19 MED ORDER — PANTOPRAZOLE SODIUM 40 MG PO TBEC
40.0000 mg | DELAYED_RELEASE_TABLET | Freq: Every day | ORAL | Status: DC
  Administered 2021-12-21 – 2021-12-30 (×10): 40 mg via ORAL
  Filled 2021-12-19 (×10): qty 1

## 2021-12-19 NOTE — Progress Notes (Signed)
Placed pt on bipap at 1206. Pt wore bipap for a few hours but then became nauseated so the nurse had to remove bipap. Pt is still confused. MD aware.

## 2021-12-19 NOTE — Progress Notes (Signed)
Pt having nausea and bouts of vomiting clear thin emesis. NP notified.

## 2021-12-19 NOTE — Progress Notes (Signed)
PROGRESS NOTE    Victoria Rice  ZOX:[id number] DOB: [birth year] DOA: 12/15/2021 PCP: Faustino Congress, NP    Brief Narrative:  88 year old independent living dwelling white female Left upper outer breast cancer status postlumpectomy 2017 previously on anastrozole Diagnosed with neuroendocrine tumor of lung/pulmonary carcinoid Prior aberrant right subclavian artery Underlying PVCs, mild HFpEF based on echocardiogram 09/26/2019 EF 60 to 65% Severe prior COPD  with FEV1 of 0.4 followed by Dr. Sudie Grumbling   Seen several weeks ago in oncology office was short of breath declined work-up-had recently been placed on Norvasc for blood pressure but this was complicated by lower extremity edema and hence was placed on Lasix-they discontinued Norvasc recently   Report from family/patient-increasing shortness of breath no other new medications no anxiolytics pain meds recently She was so weak that they brought her to emergency room there she was found to have PCO2 of 80 sodium 121 BUN/creatinine 9/0.6 BNP of 77 COVID was negative CXR no abnormality--   Assessment & Plan:   Principal Problem:   Acute bronchitis with COPD ([place]) Active Problems:   Breast cancer of upper-outer quadrant of left female breast ([place])   COPD, severe ([place])   Neuroendocrine carcinoma of lung ([place])   Malignant neoplasm of unspecified part of unspecified bronchus or lung ([place])   Acute hypovolemic hyponatremia   Acute respiratory failure with hypercapnia ([place])  Acute type II respiratory failure with bronchitis/COPD exacerbation with hypercarbia Earlier had required BiPAP in the emergency room Respiratory viral panel negative Had been attempting weaning This AM, more lethargic with ABG ordered and reviewed. Findings of pCO2 of over 100 Pt placed back on bipap Prednisone transitioned to IV solumedrol  Severe COPD FEV0.4 in 2017 Now on prednisone per PCCM Discontinued cefepime 1/9 ordered on admission and placed  on cefdinir for another 2 more days Continue albuterol every 6 as needed, Brovana 15 mcg twice daily, Pulmicort twice daily--pulmonary recommends Trelegy 1 puff daily on discharge to simplify things instead of all the other inhalers Recommend outpatient spirometry FEV1 etc. Mild chest pain pleuritic and no overt concerns for DVT noted HTN Continue amlodipine 2.5, can use as needed metoprolol 5 mg every 6 as needed for pressure >170 BP stable thus far Acute hypovolemic hyponatremia Salt tabs were added recently  Serum osm 251 with urine osm around 350 with urine Na of 24  Sodium up to 811 Mild metabolic alkalosis ruled out. Respiratory acidosis secondary to hypercarbic respiratory failure ruled in  pH of 7.2 with elevated pCO2 per above   Prior breast cancer, neuroendocrine tumor of the lung Outpatient follow-up with pulmonary/oncology as needed  DVT prophylaxis: Lovenox subq Code Status: Full Family Communication: Pt in room, family not at bedside  Status is: Inpatient  Remains inpatient appropriate because: Severity of illness   Consultants:  PCCM  Procedures:    Antimicrobials: Anti-infectives (From admission, onward)    Start     Dose/Rate Route Frequency Ordered Stop   12/16/21 2200  cefdinir (OMNICEF) capsule 300 mg        300 mg Oral Every 12 hours 12/16/21 1628 12/19/21 2359   12/15/21 2000  ceFEPIme (MAXIPIME) 2 g in sodium chloride 0.9 % 100 mL IVPB  Status:  Discontinued        2 g 200 mL/hr over 30 Minutes Intravenous Every 12 hours 12/15/21 1852 12/16/21 1628   12/15/21 1615  ceFEPIme (MAXIPIME) 2 g in sodium chloride 0.9 % 100 mL IVPB  Status:  Discontinued  2 g 200 mL/hr over 30 Minutes Intravenous Every 8 hours 12/15/21 1606 12/15/21 1852       Subjective: Lethargic this AM  Objective: Vitals:   12/19/21 0700 12/19/21 0745 12/19/21 1108 12/19/21 1206  BP:      Pulse: 87  83 79  Resp: 17  16 17   Temp:  98.2 F (36.8 C)  97.6 F (36.4 C)   TempSrc:  Oral  Axillary  SpO2: 97% 97% 95% 98%  Weight:      Height:        Intake/Output Summary (Last 24 hours) at 12/19/2021 1520 Last data filed at 12/18/2021 2000 Gross per 24 hour  Intake 240 ml  Output 400 ml  Net -160 ml    Filed Weights   12/16/21 0500 12/17/21 0500 12/18/21 0438  Weight: 51.2 kg 57.9 kg 54.5 kg    Examination: General exam: Lethargic, in no acute distress Respiratory system: normal chest rise, clear, decreased BS Cardiovascular system: regular rhythm, s1-s2 Gastrointestinal system: Nondistended, nontender, pos BS Central nervous system: No seizures, no tremors Extremities: No cyanosis, no joint deformities Skin: No rashes, no pallor Psychiatry: unable to assess given mentation  Data Reviewed: I have personally reviewed following labs and imaging studies  CBC: Recent Labs  Lab 12/15/21 1351 12/16/21 0253 12/17/21 0244 12/19/21 0240  WBC 13.3* 5.7 11.7* 10.2  NEUTROABS 11.3*  --  9.0*  --   HGB 16.0* 14.1 13.9 13.8  HCT 49.0* 44.1 44.7 46.1*  MCV 90.6 92.3 95.7 98.7  PLT 312 287 296 716    Basic Metabolic Panel: Recent Labs  Lab 12/15/21 1351 12/16/21 0253 12/17/21 0244 12/18/21 0315 12/19/21 0240  NA 121* 124* 123* 123* 128*  K 3.5 4.3 5.0 4.9 4.8  CL 76* 81* 85* 86* 84*  CO2 34* 34* 34* 32 40*  GLUCOSE 110* 131* 94 116* 111*  BUN 9 11 17 12 11   CREATININE 0.67 0.45 0.59 0.48 0.51  CALCIUM 8.7* 8.4* 8.6* 8.3* 8.5*  MG 1.9  --   --   --   --     GFR: Estimated Creatinine Clearance: 34.9 mL/min (by C-G formula based on SCr of 0.51 mg/dL). Liver Function Tests: Recent Labs  Lab 12/15/21 1351 12/16/21 0253 12/17/21 0244 12/19/21 0240  AST 23 19 18  14*  ALT 20 17 19 19   ALKPHOS 81 67 62 56  BILITOT 1.2 0.8 0.7 0.6  PROT 7.2 6.0* 6.0* 5.7*  ALBUMIN 4.1 3.5 3.5 3.3*    No results for input(s): LIPASE, AMYLASE in the last 168 hours. No results for input(s): AMMONIA in the last 168 hours. Coagulation  Profile: Recent Labs  Lab 12/16/21 0253  INR 1.1    Cardiac Enzymes: No results for input(s): CKTOTAL, CKMB, CKMBINDEX, TROPONINI in the last 168 hours. BNP (last 3 results) No results for input(s): PROBNP in the last 8760 hours. HbA1C: No results for input(s): HGBA1C in the last 72 hours. CBG: No results for input(s): GLUCAP in the last 168 hours. Lipid Profile: No results for input(s): CHOL, HDL, LDLCALC, TRIG, CHOLHDL, LDLDIRECT in the last 72 hours. Thyroid Function Tests: No results for input(s): TSH, T4TOTAL, FREET4, T3FREE, THYROIDAB in the last 72 hours.  Anemia Panel: No results for input(s): VITAMINB12, FOLATE, FERRITIN, TIBC, IRON, RETICCTPCT in the last 72 hours. Sepsis Labs: No results for input(s): PROCALCITON, LATICACIDVEN in the last 168 hours.  Recent Results (from the past 240 hour(s))  Resp Panel by RT-PCR (Flu A&B, Covid) Nasopharyngeal Swab  Status: None   Collection Time: 12/15/21  1:51 PM   Specimen: Nasopharyngeal Swab; Nasopharyngeal(NP) swabs in vial transport medium  Result Value Ref Range Status   SARS Coronavirus 2 by RT PCR NEGATIVE NEGATIVE Final    Comment: (NOTE) SARS-CoV-2 target nucleic acids are NOT DETECTED.  The SARS-CoV-2 RNA is generally detectable in upper respiratory specimens during the acute phase of infection. The lowest concentration of SARS-CoV-2 viral copies this assay can detect is 138 copies/mL. A negative result does not preclude SARS-Cov-2 infection and should not be used as the sole basis for treatment or other patient management decisions. A negative result may occur with  improper specimen collection/handling, submission of specimen other than nasopharyngeal swab, presence of viral mutation(s) within the areas targeted by this assay, and inadequate number of viral copies(<138 copies/mL). A negative result must be combined with clinical observations, patient history, and epidemiological information. The expected  result is Negative.  Fact Sheet for Patients:  EntrepreneurPulse.com.au  Fact Sheet for Healthcare Providers:  IncredibleEmployment.be  This test is no t yet approved or cleared by the [place] FDA and  has been authorized for detection and/or diagnosis of SARS-CoV-2 by FDA under an Emergency Use Authorization (EUA). This EUA will remain  in effect (meaning this test can be used) for the duration of the COVID-19 declaration under Section 564(b)(1) of the Act, 21 U.S.C.section 360bbb-3(b)(1), unless the authorization is terminated  or revoked sooner.       Influenza A by PCR NEGATIVE NEGATIVE Final   Influenza B by PCR NEGATIVE NEGATIVE Final    Comment: (NOTE) The Xpert Xpress SARS-CoV-2/FLU/RSV plus assay is intended as an aid in the diagnosis of influenza from Nasopharyngeal swab specimens and should not be used as a sole basis for treatment. Nasal washings and aspirates are unacceptable for Xpert Xpress SARS-CoV-2/FLU/RSV testing.  Fact Sheet for Patients: EntrepreneurPulse.com.au  Fact Sheet for Healthcare Providers: IncredibleEmployment.be  This test is not yet approved or cleared by the [place] FDA and has been authorized for detection and/or diagnosis of SARS-CoV-2 by FDA under an Emergency Use Authorization (EUA). This EUA will remain in effect (meaning this test can be used) for the duration of the COVID-19 declaration under Section 564(b)(1) of the Act, 21 U.S.C. section 360bbb-3(b)(1), unless the authorization is terminated or revoked.  Performed at [facility], [place] [address], [place], [place] [address]   MRSA Next Gen by PCR, Nasal     Status: None   Collection Time: 12/15/21  5:44 PM   Specimen: Nasal Mucosa; Nasal Swab  Result Value Ref Range Status   MRSA by PCR Next Gen NOT DETECTED NOT DETECTED Final    Comment: (NOTE) The GeneXpert MRSA Assay (FDA  approved for NASAL specimens only), is one component of a comprehensive MRSA colonization surveillance program. It is not intended to diagnose MRSA infection nor to guide or monitor treatment for MRSA infections. Test performance is not FDA approved in patients less than 37 years old. Performed at [facility], [place] [address] [place], [place] [address]   Respiratory (~20 pathogens) panel by PCR     Status: None   Collection Time: 12/16/21 12:09 PM   Specimen: Nasopharyngeal Swab; Respiratory  Result Value Ref Range Status   Adenovirus NOT DETECTED NOT DETECTED Final   Coronavirus 229E NOT DETECTED NOT DETECTED Final    Comment: (NOTE) The Coronavirus on the Respiratory Panel, DOES NOT test for the novel  Coronavirus (2019 nCoV)  Coronavirus HKU1 NOT DETECTED NOT DETECTED Final   Coronavirus NL63 NOT DETECTED NOT DETECTED Final   Coronavirus OC43 NOT DETECTED NOT DETECTED Final   Metapneumovirus NOT DETECTED NOT DETECTED Final   Rhinovirus / Enterovirus NOT DETECTED NOT DETECTED Final   Influenza A NOT DETECTED NOT DETECTED Final   Influenza B NOT DETECTED NOT DETECTED Final   Parainfluenza Virus 1 NOT DETECTED NOT DETECTED Final   Parainfluenza Virus 2 NOT DETECTED NOT DETECTED Final   Parainfluenza Virus 3 NOT DETECTED NOT DETECTED Final   Parainfluenza Virus 4 NOT DETECTED NOT DETECTED Final   Respiratory Syncytial Virus NOT DETECTED NOT DETECTED Final   Bordetella pertussis NOT DETECTED NOT DETECTED Final   Bordetella Parapertussis NOT DETECTED NOT DETECTED Final   Chlamydophila pneumoniae NOT DETECTED NOT DETECTED Final   Mycoplasma pneumoniae NOT DETECTED NOT DETECTED Final    Comment: Performed at [facility], [place] [address] [place], [place] [address]      Radiology Studies: DG Abd 1 View  Result Date: 12/19/2021 CLINICAL DATA:  Abdominal pain. EXAM: ABDOMEN - 1 VIEW COMPARISON:  None. FINDINGS: The bowel gas pattern is normal. No  radio-opaque calculi or other significant radiographic abnormality are seen. IMPRESSION: Negative. Electronically Signed   By: Marijo Conception M.D.   On: 12/19/2021 14:38   DG CHEST PORT 1 VIEW  Result Date: 12/19/2021 CLINICAL DATA:  Respiratory compromise EXAM: PORTABLE CHEST 1 VIEW COMPARISON:  Four days ago FINDINGS: Hazy bilateral lower chest opacification which is new/progressed. Bilateral pleural fluid. No pneumothorax. Normal heart size and mediastinal contours. IMPRESSION: New or progressive opacity of the lower chest with pleural fluid and either atelectasis or edema. Electronically Signed   By: Jorje Guild M.D.   On: 12/19/2021 07:49    Scheduled Meds:  amLODipine  2.5 mg Oral Daily   arformoterol  15 mcg Nebulization BID   budesonide (PULMICORT) nebulizer solution  0.5 mg Nebulization Q12H   cefdinir  300 mg Oral Q12H   chlorhexidine  15 mL Mouth Rinse BID   Chlorhexidine Gluconate Cloth  6 each Topical Daily   enoxaparin (LOVENOX) injection  40 mg Subcutaneous Q24H   guaiFENesin  600 mg Oral BID   levothyroxine  50 mcg Oral QAC breakfast   mouth rinse  15 mL Mouth Rinse q12n4p   methylPREDNISolone (SOLU-MEDROL) injection  40 mg Intravenous Q24H   [START ON 12/20/2021] pantoprazole  40 mg Oral Daily   revefenacin  175 mcg Nebulization Daily   sodium chloride  1 g Oral BID WC   Continuous Infusions:   LOS: 4 days   Marylu Lund, MD [facility] Pager On Amion  If 7PM-7AM, please contact night-coverage 12/19/2021, 3:20 PM

## 2021-12-19 NOTE — Progress Notes (Signed)
Pt having more nausea and vomiting after sleeping for several hours. Complaining of lower back pain. Heating packs placed on her back. Olena Heckle, NP notified of nausea and lack of GI prophylaxis orders. Orders received.

## 2021-12-20 LAB — COMPREHENSIVE METABOLIC PANEL
ALT: 24 U/L (ref 0–44)
AST: 25 U/L (ref 15–41)
Albumin: 3.4 g/dL — ABNORMAL LOW (ref 3.5–5.0)
Alkaline Phosphatase: 62 U/L (ref 38–126)
Anion gap: 8 (ref 5–15)
BUN: 18 mg/dL (ref 8–23)
CO2: 38 mmol/L — ABNORMAL HIGH (ref 22–32)
Calcium: 8.7 mg/dL — ABNORMAL LOW (ref 8.9–10.3)
Chloride: 82 mmol/L — ABNORMAL LOW (ref 98–111)
Creatinine, Ser: 0.39 mg/dL — ABNORMAL LOW (ref 0.44–1.00)
GFR, Estimated: >60 mL/min (ref >60)
Glucose, Bld: 91 mg/dL (ref 70–99)
Potassium: 5.3 mmol/L — ABNORMAL HIGH (ref 3.5–5.1)
Sodium: 128 mmol/L — ABNORMAL LOW (ref 135–145)
Total Bilirubin: 0.9 mg/dL (ref 0.3–1.2)
Total Protein: 5.8 g/dL — ABNORMAL LOW (ref 6.5–8.1)

## 2021-12-20 MED ORDER — HYDRALAZINE HCL 20 MG/ML IJ SOLN
10.0000 mg | INTRAMUSCULAR | Status: DC | PRN
  Administered 2021-12-20 – 2021-12-22 (×3): 10 mg via INTRAVENOUS
  Filled 2021-12-20 (×4): qty 1

## 2021-12-20 MED ORDER — ALPRAZOLAM 0.25 MG PO TABS
0.2500 mg | ORAL_TABLET | Freq: Once | ORAL | Status: AC
  Administered 2021-12-20: 0.25 mg via ORAL
  Filled 2021-12-20: qty 1

## 2021-12-20 MED ORDER — PROCHLORPERAZINE EDISYLATE 10 MG/2ML IJ SOLN
5.0000 mg | Freq: Four times a day (QID) | INTRAMUSCULAR | Status: DC | PRN
  Administered 2021-12-20 (×2): 5 mg via INTRAVENOUS
  Filled 2021-12-20 (×2): qty 2

## 2021-12-20 MED ORDER — LIP MEDEX EX OINT
TOPICAL_OINTMENT | CUTANEOUS | Status: DC | PRN
  Administered 2021-12-20: 1 via TOPICAL
  Filled 2021-12-20 (×2): qty 7

## 2021-12-20 NOTE — Progress Notes (Signed)
Chaplain visited with patient.  She had requested prayer.   Her son-in-law was bedside. She was trying to communicate but it was difficult through the mask she wore. She did say "I'm not gonna die."  Her son said no, that's not the reason the chaplain is here.  We ALL need prayer."  Chaplain offered Psalm 23 and the patient recited it word for word with her. The chaplain then offered a continuing prayer and blessing.  Family members arrived and were waiting outside the room.  They are aware she is getting spiritual support.  Please contact our department if she or family requests more support. Rev. Tamsen Snider Pager [phone]

## 2021-12-20 NOTE — Progress Notes (Signed)
PROGRESS NOTE    Victoria Rice  QVZ:[id number] DOB: 1931/04/03 DOA: 12/15/2021 PCP: Faustino Congress, NP    Brief Narrative:  88 year old independent living dwelling white female Left upper outer breast cancer status postlumpectomy 2017 previously on anastrozole Diagnosed with neuroendocrine tumor of lung/pulmonary carcinoid Prior aberrant right subclavian artery Underlying PVCs, mild HFpEF based on echocardiogram 09/26/2019 EF 60 to 65% Severe prior COPD  with FEV1 of 0.4 followed by Dr. Sudie Grumbling   Seen several weeks ago in oncology office was short of breath declined work-up-had recently been placed on Norvasc for blood pressure but this was complicated by lower extremity edema and hence was placed on Lasix-they discontinued Norvasc recently   Report from family/patient-increasing shortness of breath no other new medications no anxiolytics pain meds recently She was so weak that they brought her to emergency room there she was found to have PCO2 of 80 sodium 121 BUN/creatinine 9/0.6 BNP of 37 COVID was negative CXR no abnormality--   Assessment & Plan:   Principal Problem:   Acute bronchitis with COPD ([place]) Active Problems:   Breast cancer of upper-outer quadrant of left female breast ([place])   COPD, severe ([place])   Neuroendocrine carcinoma of lung ([place])   Malignant neoplasm of unspecified part of unspecified bronchus or lung ([place])   Acute hypovolemic hyponatremia   Acute respiratory failure with hypercapnia ([place])  Acute type II respiratory failure with bronchitis/COPD exacerbation with hypercarbia Earlier had required BiPAP in the emergency room Respiratory viral panel negative Had been attempting weaning O2 earlier this week On 1/12, pt appeared more lethargic with ABG demonstrating a pCO2 of over 100 More alert, but still confused Pt continued on bipap Prednisone transitioned to IV solumedrol  Severe COPD FEV0.4 in 2017 Now on prednisone per PCCM Discontinued  cefepime 1/9 ordered on admission  Completed course of cefdinir Continue albuterol every 6 as needed, Brovana 15 mcg twice daily, Pulmicort twice daily--pulmonary recommends Trelegy 1 puff daily on discharge to simplify things instead of all the other inhalers Recommend outpatient spirometry FEV1 etc. Mild chest pain pleuritic and no overt concerns for DVT noted HTN Continue amlodipine 2.5, can use as needed metoprolol 5 mg every 6 as needed for pressure >170 BP remains stable thus far Acute hypovolemic hyponatremia Salt tabs were added recently  Serum osm 251 with urine osm around 350 with urine Na of 24  Sodium stable at 329 Mild metabolic alkalosis ruled out. Respiratory acidosis secondary to hypercarbic respiratory failure ruled in  pH of 7.2 with elevated pCO2 per above  Cont bipap as tolerated   Prior breast cancer, neuroendocrine tumor of the lung Outpatient follow-up with pulmonary/oncology as needed  DVT prophylaxis: Lovenox subq Code Status: Full Family Communication: Pt in room, family not at bedside  Status is: Inpatient  Remains inpatient appropriate because: Severity of illness   Consultants:  PCCM  Procedures:    Antimicrobials: Anti-infectives (From admission, onward)    Start     Dose/Rate Route Frequency Ordered Stop   12/16/21 2200  cefdinir (OMNICEF) capsule 300 mg        300 mg Oral Every 12 hours 12/16/21 1628 12/19/21 2359   12/15/21 2000  ceFEPIme (MAXIPIME) 2 g in sodium chloride 0.9 % 100 mL IVPB  Status:  Discontinued        2 g 200 mL/hr over 30 Minutes Intravenous Every 12 hours 12/15/21 1852 12/16/21 1628   12/15/21 1615  ceFEPIme (MAXIPIME) 2 g in sodium chloride 0.9 % 100  mL IVPB  Status:  Discontinued        2 g 200 mL/hr over 30 Minutes Intravenous Every 8 hours 12/15/21 1606 12/15/21 1852       Subjective: More awake, confused  Objective: Vitals:   12/20/21 0759 12/20/21 0956 12/20/21 1119 12/20/21 1149  BP:      Pulse:  (!)  102 91   Resp:  (!) 26 18   Temp: 98.3 F (36.8 C)   99 F (37.2 C)  TempSrc: Oral   Axillary  SpO2: 97% 96% 97%   Weight:      Height:        Intake/Output Summary (Last 24 hours) at 12/20/2021 1342 Last data filed at 12/19/2021 2000 Gross per 24 hour  Intake --  Output 330 ml  Net -330 ml    Filed Weights   12/17/21 0500 12/18/21 0438 12/20/21 0500  Weight: 57.9 kg 54.5 kg 53.8 kg    Examination: General exam: Conversant, in no acute distress Respiratory system: normal chest rise, clear, no audible wheezing Cardiovascular system: regular rhythm, s1-s2 Gastrointestinal system: Nondistended, nontender, pos BS Central nervous system: No seizures, no tremors Extremities: No cyanosis, no joint deformities Skin: No rashes, no pallor Psychiatry:difficult to assess given confusion  Data Reviewed: I have personally reviewed following labs and imaging studies  CBC: Recent Labs  Lab 12/15/21 1351 12/16/21 0253 12/17/21 0244 12/19/21 0240  WBC 13.3* 5.7 11.7* 10.2  NEUTROABS 11.3*  --  9.0*  --   HGB 16.0* 14.1 13.9 13.8  HCT 49.0* 44.1 44.7 46.1*  MCV 90.6 92.3 95.7 98.7  PLT 312 287 296 657    Basic Metabolic Panel: Recent Labs  Lab 12/15/21 1351 12/16/21 0253 12/17/21 0244 12/18/21 0315 12/19/21 0240 12/20/21 0805  NA 121* 124* 123* 123* 128* 128*  K 3.5 4.3 5.0 4.9 4.8 5.3*  CL 76* 81* 85* 86* 84* 82*  CO2 34* 34* 34* 32 40* 38*  GLUCOSE 110* 131* 94 116* 111* 91  BUN 9 11 17 12 11 18   CREATININE 0.67 0.45 0.59 0.48 0.51 0.39*  CALCIUM 8.7* 8.4* 8.6* 8.3* 8.5* 8.7*  MG 1.9  --   --   --   --   --     GFR: Estimated Creatinine Clearance: 34.9 mL/min (A) (by C-G formula based on SCr of 0.39 mg/dL (L)). Liver Function Tests: Recent Labs  Lab 12/15/21 1351 12/16/21 0253 12/17/21 0244 12/19/21 0240 12/20/21 0805  AST 23 19 18  14* 25  ALT 20 17 19 19 24   ALKPHOS 81 67 62 56 62  BILITOT 1.2 0.8 0.7 0.6 0.9  PROT 7.2 6.0* 6.0* 5.7* 5.8*  ALBUMIN  4.1 3.5 3.5 3.3* 3.4*    No results for input(s): LIPASE, AMYLASE in the last 168 hours. No results for input(s): AMMONIA in the last 168 hours. Coagulation Profile: Recent Labs  Lab 12/16/21 0253  INR 1.1    Cardiac Enzymes: No results for input(s): CKTOTAL, CKMB, CKMBINDEX, TROPONINI in the last 168 hours. BNP (last 3 results) No results for input(s): PROBNP in the last 8760 hours. HbA1C: No results for input(s): HGBA1C in the last 72 hours. CBG: No results for input(s): GLUCAP in the last 168 hours. Lipid Profile: No results for input(s): CHOL, HDL, LDLCALC, TRIG, CHOLHDL, LDLDIRECT in the last 72 hours. Thyroid Function Tests: No results for input(s): TSH, T4TOTAL, FREET4, T3FREE, THYROIDAB in the last 72 hours.  Anemia Panel: No results for input(s): VITAMINB12, FOLATE, FERRITIN, TIBC, IRON,  RETICCTPCT in the last 72 hours. Sepsis Labs: No results for input(s): PROCALCITON, LATICACIDVEN in the last 168 hours.  Recent Results (from the past 240 hour(s))  Resp Panel by RT-PCR (Flu A&B, Covid) Nasopharyngeal Swab     Status: None   Collection Time: 12/15/21  1:51 PM   Specimen: Nasopharyngeal Swab; Nasopharyngeal(NP) swabs in vial transport medium  Result Value Ref Range Status   SARS Coronavirus 2 by RT PCR NEGATIVE NEGATIVE Final    Comment: (NOTE) SARS-CoV-2 target nucleic acids are NOT DETECTED.  The SARS-CoV-2 RNA is generally detectable in upper respiratory specimens during the acute phase of infection. The lowest concentration of SARS-CoV-2 viral copies this assay can detect is 138 copies/mL. A negative result does not preclude SARS-Cov-2 infection and should not be used as the sole basis for treatment or other patient management decisions. A negative result may occur with  improper specimen collection/handling, submission of specimen other than nasopharyngeal swab, presence of viral mutation(s) within the areas targeted by this assay, and inadequate number of  viral copies(<138 copies/mL). A negative result must be combined with clinical observations, patient history, and epidemiological information. The expected result is Negative.  Fact Sheet for Patients:  EntrepreneurPulse.com.au  Fact Sheet for Healthcare Providers:  IncredibleEmployment.be  This test is no t yet approved or cleared by the [place] FDA and  has been authorized for detection and/or diagnosis of SARS-CoV-2 by FDA under an Emergency Use Authorization (EUA). This EUA will remain  in effect (meaning this test can be used) for the duration of the COVID-19 declaration under Section 564(b)(1) of the Act, 21 U.S.C.section 360bbb-3(b)(1), unless the authorization is terminated  or revoked sooner.       Influenza A by PCR NEGATIVE NEGATIVE Final   Influenza B by PCR NEGATIVE NEGATIVE Final    Comment: (NOTE) The Xpert Xpress SARS-CoV-2/FLU/RSV plus assay is intended as an aid in the diagnosis of influenza from Nasopharyngeal swab specimens and should not be used as a sole basis for treatment. Nasal washings and aspirates are unacceptable for Xpert Xpress SARS-CoV-2/FLU/RSV testing.  Fact Sheet for Patients: EntrepreneurPulse.com.au  Fact Sheet for Healthcare Providers: IncredibleEmployment.be  This test is not yet approved or cleared by the [place] FDA and has been authorized for detection and/or diagnosis of SARS-CoV-2 by FDA under an Emergency Use Authorization (EUA). This EUA will remain in effect (meaning this test can be used) for the duration of the COVID-19 declaration under Section 564(b)(1) of the Act, 21 U.S.C. section 360bbb-3(b)(1), unless the authorization is terminated or revoked.  Performed at [facility], [place] [address], [place], [place] [address]   MRSA Next Gen by PCR, Nasal     Status: None   Collection Time: 12/15/21  5:44 PM   Specimen: Nasal  Mucosa; Nasal Swab  Result Value Ref Range Status   MRSA by PCR Next Gen NOT DETECTED NOT DETECTED Final    Comment: (NOTE) The GeneXpert MRSA Assay (FDA approved for NASAL specimens only), is one component of a comprehensive MRSA colonization surveillance program. It is not intended to diagnose MRSA infection nor to guide or monitor treatment for MRSA infections. Test performance is not FDA approved in patients less than 75 years old. Performed at [facility], [place] [address], [place], [place] [address]   Respiratory (~20 pathogens) panel by PCR     Status: None   Collection Time: 12/16/21 12:09 PM   Specimen: Nasopharyngeal Swab; Respiratory  Result Value Ref Range  Status   Adenovirus NOT DETECTED NOT DETECTED Final   Coronavirus 229E NOT DETECTED NOT DETECTED Final    Comment: (NOTE) The Coronavirus on the Respiratory Panel, DOES NOT test for the novel  Coronavirus (2019 nCoV)    Coronavirus HKU1 NOT DETECTED NOT DETECTED Final   Coronavirus NL63 NOT DETECTED NOT DETECTED Final   Coronavirus OC43 NOT DETECTED NOT DETECTED Final   Metapneumovirus NOT DETECTED NOT DETECTED Final   Rhinovirus / Enterovirus NOT DETECTED NOT DETECTED Final   Influenza A NOT DETECTED NOT DETECTED Final   Influenza B NOT DETECTED NOT DETECTED Final   Parainfluenza Virus 1 NOT DETECTED NOT DETECTED Final   Parainfluenza Virus 2 NOT DETECTED NOT DETECTED Final   Parainfluenza Virus 3 NOT DETECTED NOT DETECTED Final   Parainfluenza Virus 4 NOT DETECTED NOT DETECTED Final   Respiratory Syncytial Virus NOT DETECTED NOT DETECTED Final   Bordetella pertussis NOT DETECTED NOT DETECTED Final   Bordetella Parapertussis NOT DETECTED NOT DETECTED Final   Chlamydophila pneumoniae NOT DETECTED NOT DETECTED Final   Mycoplasma pneumoniae NOT DETECTED NOT DETECTED Final    Comment: Performed at [facility], [place] [address], [place], [place] [address]      Radiology Studies: DG Abd 1  View  Result Date: 12/19/2021 CLINICAL DATA:  Abdominal pain. EXAM: ABDOMEN - 1 VIEW COMPARISON:  None. FINDINGS: The bowel gas pattern is normal. No radio-opaque calculi or other significant radiographic abnormality are seen. IMPRESSION: Negative. Electronically Signed   By: Marijo Conception M.D.   On: 12/19/2021 14:38   DG CHEST PORT 1 VIEW  Result Date: 12/19/2021 CLINICAL DATA:  Respiratory compromise EXAM: PORTABLE CHEST 1 VIEW COMPARISON:  Four days ago FINDINGS: Hazy bilateral lower chest opacification which is new/progressed. Bilateral pleural fluid. No pneumothorax. Normal heart size and mediastinal contours. IMPRESSION: New or progressive opacity of the lower chest with pleural fluid and either atelectasis or edema. Electronically Signed   By: Jorje Guild M.D.   On: 12/19/2021 07:49    Scheduled Meds:  amLODipine  2.5 mg Oral Daily   arformoterol  15 mcg Nebulization BID   budesonide (PULMICORT) nebulizer solution  0.5 mg Nebulization Q12H   chlorhexidine  15 mL Mouth Rinse BID   Chlorhexidine Gluconate Cloth  6 each Topical Daily   enoxaparin (LOVENOX) injection  40 mg Subcutaneous Q24H   guaiFENesin  600 mg Oral BID   levothyroxine  50 mcg Oral QAC breakfast   mouth rinse  15 mL Mouth Rinse q12n4p   methylPREDNISolone (SOLU-MEDROL) injection  40 mg Intravenous Q24H   pantoprazole  40 mg Oral Daily   revefenacin  175 mcg Nebulization Daily   sodium chloride  1 g Oral BID WC   Continuous Infusions:   LOS: 5 days   Marylu Lund, MD [facility] Pager On Amion  If 7PM-7AM, please contact night-coverage 12/20/2021, 1:42 PM

## 2021-12-20 NOTE — Progress Notes (Signed)
RT NOTE:  Pt placed on BiPAP with no complications noted. RN aware.

## 2021-12-20 NOTE — Progress Notes (Signed)
Occupational Therapy Treatment  Patient with decline in status from previous OT note. Patient is now on bipap and with confusion perseverating on where her son is despite being told numerous times he is in the waiting room. Patient also repeating herself "I don't want to die," then at times difficult to understand due to bipap. Patient needing mod cues to redirect to bed mobility. Patient needing mod A x1-2 for bed mobility and once stabilized able to maintain static sitting without UE support for ~10 mins and VSS. Acute OT to follow.    12/20/21 1439  OT Visit Information  Last OT Received On 12/20/21  Assistance Needed +2  PT/OT/SLP Co-Evaluation/Treatment Yes  Reason for Co-Treatment For patient/therapist safety;To address functional/ADL transfers;Complexity of the patient's impairments (multi-system involvement)  PT goals addressed during session Mobility/safety with mobility  OT goals addressed during session ADL's and self-care  History of Present Illness 88 yo female admitted with acute resp failure 2* bronchiectasis, COPD exac. Hx of severe COPD  Precautions  Precautions Fall  Precaution Comments monitor vitals, on off BiPAP  Pain Assessment  Pain Assessment Faces  Faces Pain Scale 0  Cognition  Arousal/Alertness Awake/alert  Behavior During Therapy Anxious  Overall Cognitive Status Impaired/Different from baseline  Area of Impairment Memory;Following commands;Safety/judgement;Awareness  Memory Decreased short-term memory  Following Commands Follows one step commands inconsistently  Safety/Judgement Decreased awareness of safety  Awareness Intellectual  General Comments Confused, repeatedly asking about her son despite being told he is in the waiting room. Distracted by people walking by in hallway. Continuously talking and repeating "I don't want to die" and at times difficult to understand due to being on bipap. Knows she is in the hospital.  Bed Mobility  Overal bed mobility  Needs Assistance  Bed Mobility Supine to Sit;Sit to Supine  Supine to sit Mod assist;+2 for safety/equipment  Sit to supine Mod assist;+2 for safety/equipment  General bed mobility comments extra time, multimodal cues  ,assist with trunk and legs.  Transfers  General transfer comment NT, on BiPAP and patient confused  Balance  Overall balance assessment Needs assistance  Sitting-balance support Feet supported  Sitting balance-Leahy Scale Fair  Standing balance comment NT  General Comments  General comments (skin integrity, edema, etc.) VSS on bipap. Patient able to tolerate sitting up at edge of bed for ~10 mins before returning back to bed.  OT - End of Session  Equipment Utilized During Treatment Oxygen  Activity Tolerance Patient tolerated treatment well  Patient left in bed;with call bell/phone within reach;with bed alarm set  Nurse Communication Mobility status  OT Assessment/Plan  OT Plan Discharge plan remains appropriate  OT Visit Diagnosis Unsteadiness on feet (R26.81);Other abnormalities of gait and mobility (R26.89);Muscle weakness (generalized) (M62.81)  OT Frequency (ACUTE ONLY) Min 2X/week  Follow Up Recommendations Skilled nursing-short term rehab (<3 hours/day)  Assistance recommended at discharge Frequent or constant Supervision/Assistance  Patient can return home with the following A lot of help with walking and/or transfers;A lot of help with bathing/dressing/bathroom  OT Equipment None recommended by OT  AM-PAC OT "6 Clicks" Daily Activity Outcome Measure (Version 2)  Help from another person eating meals? 3  Help from another person taking care of personal grooming? 3  Help from another person toileting, which includes using toliet, bedpan, or urinal? 1  Help from another person bathing (including washing, rinsing, drying)? 2  Help from another person to put on and taking off regular upper body clothing? 2  Help from another person  to put on and taking off  regular lower body clothing? 1  6 Click Score 12  Progressive Mobility  What is the highest level of mobility based on the progressive mobility assessment? Level 2 ([place]) - Balance while sitting on edge of bed and cannot stand  Mobility Sit up in bed/chair position for meals  OT Goal Progression  Progress towards OT goals Not progressing toward goals - comment (increased need of O2 to Bipap and now with confusion)  Acute Rehab OT Goals  Patient Stated Goal "Where is my son"  OT Goal Formulation With patient  Time For Goal Achievement 12/31/21  Potential to Achieve Goals Good  ADL Goals  Pt Will Perform Lower Body Dressing with supervision;sit to/from stand  Pt Will Transfer to Toilet with supervision;ambulating;regular height toilet  Pt Will Perform Toileting - Clothing Manipulation and hygiene with supervision;sit to/from stand  OT Time Calculation  OT Start Time (ACUTE ONLY) 1006  OT Stop Time (ACUTE ONLY) 1021  OT Time Calculation (min) 15 min  OT General Charges  $OT Visit 1 Visit  OT Treatments  $Therapeutic Activity 8-22 mins   Delbert Phenix OT OT pager: [phone]

## 2021-12-20 NOTE — Progress Notes (Signed)
Physical Therapy Treatment Patient Details Name: Victoria Rice MRN: [id number] DOB: Feb 18, 1931 Today's Date: 12/20/2021   History of Present Illness 88 yo female admitted with acute resp failure 2* bronchiectasis, COPD exac. Hx of severe COPD    PT Comments    Patient  back on  BiPAP, tolerated  mobilizing to bed edge and sat  x ~ 8 minutes with close supervision. SPO2 >92%. Patient speaking , much difficulty understanding with BiPAP. Continue  mobility as tolerated. Patient is asking to see son who stepped out.   Recommendations for follow up therapy are one component of a multi-disciplinary discharge planning process, led by the attending physician.  Recommendations may be updated based on patient status, additional functional criteria and insurance authorization.  Follow Up Recommendations  Skilled nursing-short term rehab (<3 hours/day)     Assistance Recommended at Discharge  24/7 assistance  Patient can return home with the following     Equipment Recommendations  None recommended by PT    Recommendations for Other Services       Precautions / Restrictions Precautions Precautions: Fall Precaution Comments: monitor vitals, on off BiPAP     Mobility  Bed Mobility   Bed Mobility: Supine to Sit;Sit to Supine     Supine to sit: Mod assist;+2 for safety/equipment Sit to supine: Mod assist;+2 for safety/equipment   General bed mobility comments: extra time, multimodal cues  ,assist with trunk and legs.    Transfers                   General transfer comment: NT, on BiPAP    Ambulation/Gait                   Stairs             Wheelchair Mobility    Modified Rankin (Stroke Patients Only)       Balance Overall balance assessment: Needs assistance Sitting-balance support: Feet unsupported (bed is high) Sitting balance-Leahy Scale: Fair Sitting balance - Comments: Static sitting able to maintain balance,       Standing  balance comment: NT                            Cognition Arousal/Alertness: Awake/alert                                     General Comments: confused, difficulty in understanding on BiPAP, Patient states that she loves everyone. Asking to see her son who stepped out        Exercises      General Comments        Pertinent Vitals/Pain Pain Assessment: Faces Pain Score: 0-No pain    Home Living                          Prior Function            PT Goals (current goals can now be found in the care plan section) Progress towards PT goals: Not progressing toward goals - comment (requires [facility] or BIPAP,)    Frequency    Min 2X/week      PT Plan Frequency needs to be updated    Co-evaluation              AM-PAC PT "6 Clicks" Mobility   Outcome Measure  Help needed turning from your back to your side while in a flat bed without using bedrails?: A Lot Help needed moving from lying on your back to sitting on the side of a flat bed without using bedrails?: A Lot Help needed moving to and from a bed to a chair (including a wheelchair)?: Total Help needed standing up from a chair using your arms (e.g., wheelchair or bedside chair)?: Total Help needed to walk in hospital room?: Total Help needed climbing 3-5 steps with a railing? : Total 6 Click Score: 8    End of Session Equipment Utilized During Treatment: Oxygen (BiPAP) Activity Tolerance: Patient tolerated treatment well;Patient limited by fatigue Patient left: in bed;with call bell/phone within reach;with bed alarm set;with family/visitor present Nurse Communication: Mobility status PT Visit Diagnosis: Muscle weakness (generalized) (M62.81);Difficulty in walking, not elsewhere classified (R26.2)     Time: 1610-9604 PT Time Calculation (min) (ACUTE ONLY): 18 min  Charges:     Visit only                    Tresa Endo PT Acute Rehabilitation Services Pager  [phone] Office [phone]    Claretha Cooper 12/20/2021, 2:42 PM

## 2021-12-21 ENCOUNTER — Inpatient Hospital Stay (HOSPITAL_COMMUNITY): Payer: Medicare PPO

## 2021-12-21 DIAGNOSIS — J9602 Acute respiratory failure with hypercapnia: Secondary | ICD-10-CM | POA: Diagnosis not present

## 2021-12-21 DIAGNOSIS — R0603 Acute respiratory distress: Secondary | ICD-10-CM

## 2021-12-21 LAB — COMPREHENSIVE METABOLIC PANEL
ALT: 21 U/L (ref 0–44)
AST: 18 U/L (ref 15–41)
Albumin: 3.4 g/dL — ABNORMAL LOW (ref 3.5–5.0)
Alkaline Phosphatase: 58 U/L (ref 38–126)
Anion gap: 9 (ref 5–15)
BUN: 20 mg/dL (ref 8–23)
CO2: 38 mmol/L — ABNORMAL HIGH (ref 22–32)
Calcium: 8.8 mg/dL — ABNORMAL LOW (ref 8.9–10.3)
Chloride: 83 mmol/L — ABNORMAL LOW (ref 98–111)
Creatinine, Ser: 0.41 mg/dL — ABNORMAL LOW (ref 0.44–1.00)
GFR, Estimated: >60 mL/min (ref >60)
Glucose, Bld: 97 mg/dL (ref 70–99)
Potassium: 5.3 mmol/L — ABNORMAL HIGH (ref 3.5–5.1)
Sodium: 130 mmol/L — ABNORMAL LOW (ref 135–145)
Total Bilirubin: 1.1 mg/dL (ref 0.3–1.2)
Total Protein: 5.8 g/dL — ABNORMAL LOW (ref 6.5–8.1)

## 2021-12-21 LAB — BASIC METABOLIC PANEL
Anion gap: 7 (ref 5–15)
BUN: 20 mg/dL (ref 8–23)
CO2: 41 mmol/L — ABNORMAL HIGH (ref 22–32)
Calcium: 8.9 mg/dL (ref 8.9–10.3)
Chloride: 82 mmol/L — ABNORMAL LOW (ref 98–111)
Creatinine, Ser: 0.43 mg/dL — ABNORMAL LOW (ref 0.44–1.00)
GFR, Estimated: >60 mL/min (ref >60)
Glucose, Bld: 100 mg/dL — ABNORMAL HIGH (ref 70–99)
Potassium: 5.1 mmol/L (ref 3.5–5.1)
Sodium: 130 mmol/L — ABNORMAL LOW (ref 135–145)

## 2021-12-21 LAB — CBC
HCT: 47.3 % — ABNORMAL HIGH (ref 36.0–46.0)
Hemoglobin: 14.1 g/dL (ref 12.0–15.0)
MCH: 29.3 pg (ref 26.0–34.0)
MCHC: 29.8 g/dL — ABNORMAL LOW (ref 30.0–36.0)
MCV: 98.3 fL (ref 80.0–100.0)
Platelets: 232 10*3/uL (ref 150–400)
RBC: 4.81 MIL/uL (ref 3.87–5.11)
RDW: 12.5 % (ref 11.5–15.5)
WBC: 7.2 10*3/uL (ref 4.0–10.5)
nRBC: 0 % (ref 0.0–0.2)

## 2021-12-21 LAB — ECHOCARDIOGRAM COMPLETE
AR max vel: 2.61 cm2
AV Area VTI: 2.65 cm2
AV Area mean vel: 2.61 cm2
AV Mean grad: 4.5 mmHg
AV Peak grad: 8.3 mmHg
Ao pk vel: 1.44 m/s
Area-P 1/2: 4.63 cm2
Height: 60 in
S' Lateral: 2.2 cm
Weight: 1820.12 oz

## 2021-12-21 LAB — BLOOD GAS, ARTERIAL
Acid-Base Excess: 13.3 mmol/L — ABNORMAL HIGH (ref 0.0–2.0)
Acid-Base Excess: 14.8 mmol/L — ABNORMAL HIGH (ref 0.0–2.0)
Bicarbonate: 44.4 mmol/L — ABNORMAL HIGH (ref 20.0–28.0)
Bicarbonate: 44 mmol/L — ABNORMAL HIGH (ref 20.0–28.0)
Delivery systems: POSITIVE
FIO2: 44
FIO2: 35
O2 Saturation: 86.6 %
O2 Saturation: 94.8 %
Patient temperature: 98.6
Patient temperature: 98.6
RATE: 20 resp/min
pCO2 arterial: 93.4 mmHg (ref 32.0–48.0)
pCO2 arterial: 77.5 mmHg (ref 32.0–48.0)
pH, Arterial: 7.298 — ABNORMAL LOW (ref 7.350–7.450)
pH, Arterial: 7.372 (ref 7.350–7.450)
pO2, Arterial: 54.8 mmHg — ABNORMAL LOW (ref 83.0–108.0)
pO2, Arterial: 70 mmHg — ABNORMAL LOW (ref 83.0–108.0)

## 2021-12-21 LAB — CK TOTAL AND CKMB (NOT AT ARMC)
CK, MB: 1.7 ng/mL (ref 0.5–5.0)
Relative Index: INVALID (ref 0.0–2.5)
Total CK: 23 U/L — ABNORMAL LOW (ref 38–234)

## 2021-12-21 LAB — PHOSPHORUS: Phosphorus: 2.1 mg/dL — ABNORMAL LOW (ref 2.5–4.6)

## 2021-12-21 LAB — MAGNESIUM: Magnesium: 2.3 mg/dL (ref 1.7–2.4)

## 2021-12-21 LAB — LACTIC ACID, PLASMA: Lactic Acid, Venous: 1.1 mmol/L (ref 0.5–1.9)

## 2021-12-21 LAB — STREP PNEUMONIAE URINARY ANTIGEN: Strep Pneumo Urinary Antigen: NEGATIVE

## 2021-12-21 LAB — BRAIN NATRIURETIC PEPTIDE: B Natriuretic Peptide: 164.8 pg/mL — ABNORMAL HIGH (ref 0.0–100.0)

## 2021-12-21 LAB — PROCALCITONIN: Procalcitonin: <0.1 ng/mL

## 2021-12-21 LAB — TROPONIN I (HIGH SENSITIVITY): Troponin I (High Sensitivity): 12 ng/L (ref <18)

## 2021-12-21 MED ORDER — SODIUM ZIRCONIUM CYCLOSILICATE 10 G PO PACK
10.0000 g | PACK | Freq: Once | ORAL | Status: AC
  Administered 2021-12-21: 10 g via ORAL
  Filled 2021-12-21: qty 1

## 2021-12-21 MED ORDER — ALPRAZOLAM 0.25 MG PO TABS
0.2500 mg | ORAL_TABLET | Freq: Once | ORAL | Status: AC
  Administered 2021-12-21: 0.25 mg via ORAL
  Filled 2021-12-21: qty 1

## 2021-12-21 MED ORDER — CALCIUM GLUCONATE-NACL 1-0.675 GM/50ML-% IV SOLN
1.0000 g | Freq: Once | INTRAVENOUS | Status: AC
  Administered 2021-12-21: 1000 mg via INTRAVENOUS
  Filled 2021-12-21: qty 50

## 2021-12-21 MED ORDER — SODIUM PHOSPHATES 45 MMOLE/15ML IV SOLN
20.0000 mmol | Freq: Once | INTRAVENOUS | Status: AC
  Administered 2021-12-21: 20 mmol via INTRAVENOUS
  Filled 2021-12-21: qty 6.67

## 2021-12-21 MED ORDER — METHYLPREDNISOLONE SODIUM SUCC 125 MG IJ SOLR
60.0000 mg | Freq: Three times a day (TID) | INTRAMUSCULAR | Status: DC
  Administered 2021-12-21 – 2021-12-23 (×7): 60 mg via INTRAVENOUS
  Filled 2021-12-21 (×7): qty 2

## 2021-12-21 MED ORDER — SODIUM CHLORIDE 0.9 % IV SOLN: INTRAVENOUS | Status: DC | PRN

## 2021-12-21 MED ORDER — ALBUTEROL SULFATE (2.5 MG/3ML) 0.083% IN NEBU
2.5000 mg | INHALATION_SOLUTION | RESPIRATORY_TRACT | Status: AC
  Administered 2021-12-21: 2.5 mg via RESPIRATORY_TRACT
  Filled 2021-12-21: qty 3

## 2021-12-21 NOTE — Progress Notes (Signed)
ABG drawn at 0950 due to patients Oxygen demand and SOB.  Victoria Rice was placed on bipap after the ABG was drawn.

## 2021-12-21 NOTE — Progress Notes (Signed)
PROGRESS NOTE    Victoria Rice  JOA:[id number] DOB: 1931-07-22 DOA: 12/15/2021 PCP: Faustino Congress, NP    Brief Narrative:  88 year old independent living dwelling white female Left upper outer breast cancer status postlumpectomy 2017 previously on anastrozole Diagnosed with neuroendocrine tumor of lung/pulmonary carcinoid Prior aberrant right subclavian artery Underlying PVCs, mild HFpEF based on echocardiogram 09/26/2019 EF 60 to 65% Severe prior COPD  with FEV1 of 0.4 followed by Dr. Sudie Grumbling   Seen several weeks ago in oncology office was short of breath declined work-up-had recently been placed on Norvasc for blood pressure but this was complicated by lower extremity edema and hence was placed on Lasix-they discontinued Norvasc recently   Report from family/patient-increasing shortness of breath no other new medications no anxiolytics pain meds recently She was so weak that they brought her to emergency room there she was found to have PCO2 of 80 sodium 121 BUN/creatinine 9/0.6 BNP of 59 COVID was negative CXR no abnormality--   Assessment & Plan:   Principal Problem:   Acute bronchitis with COPD ([place]) Active Problems:   Breast cancer of upper-outer quadrant of left female breast ([place])   COPD, severe ([place])   Neuroendocrine carcinoma of lung ([place])   Malignant neoplasm of unspecified part of unspecified bronchus or lung ([place])   Acute hypovolemic hyponatremia   Acute respiratory failure with hypercapnia ([place])  Acute type II respiratory failure with bronchitis/COPD exacerbation with hypercarbia Earlier had required BiPAP in the emergency room Respiratory viral panel negative On 1/12, pt appeared more lethargic with ABG demonstrating a pCO2 of over 100 Now more alert, however remains encephalopathic Continued solumedrol Repeat ABG reviewed with pH remains 7.2 with pCO2 93 Appreciate input by PCCM today. Recs for BiPAP 24/7. Solumedrol increased to 60mg  TID Severe  COPD FEV0.4 in 2017 Discontinued cefepime 1/9 ordered on admission  Completed course of cefdinir Continue albuterol every 6 as needed, Brovana 15 mcg twice daily, Pulmicort twice daily--pulmonary recommends Trelegy 1 puff daily on discharge to simplify things instead of all the other inhalers Recommend outpatient spirometry FEV1 etc. Per above, continued BiPAP with steroids increased Mild chest pain No overt concerns for DVT noted HTN Continued amlodipine 2.5, can use as needed metoprolol 5 mg every 6 as needed for pressure >170 Remains stable thus far Acute hypovolemic hyponatremia Salt tabs were added recently  Serum osm 251 with urine osm around 350 with urine Na of 24  Sodium up to 130 today Mild metabolic alkalosis ruled out. Respiratory acidosis secondary to hypercarbic respiratory failure ruled in  pH of 7.2 with elevated pCO2 per above  Pt is continued on bipap per above with steroids   Prior breast cancer, neuroendocrine tumor of the lung Outpatient follow-up with pulmonary/oncology as needed  DVT prophylaxis: Lovenox subq Code Status: Full Family Communication: Pt in room, family not at bedside  Status is: Inpatient  Remains inpatient appropriate because: Severity of illness   Consultants:  PCCM  Procedures:    Antimicrobials: Anti-infectives (From admission, onward)    Start     Dose/Rate Route Frequency Ordered Stop   12/16/21 2200  cefdinir (OMNICEF) capsule 300 mg        300 mg Oral Every 12 hours 12/16/21 1628 12/19/21 2359   12/15/21 2000  ceFEPIme (MAXIPIME) 2 g in sodium chloride 0.9 % 100 mL IVPB  Status:  Discontinued        2 g 200 mL/hr over 30 Minutes Intravenous Every 12 hours 12/15/21 1852 12/16/21 1628  12/15/21 1615  ceFEPIme (MAXIPIME) 2 g in sodium chloride 0.9 % 100 mL IVPB  Status:  Discontinued        2 g 200 mL/hr over 30 Minutes Intravenous Every 8 hours 12/15/21 1606 12/15/21 1852       Subjective: Remains  confused  Objective: Vitals:   12/21/21 0800 12/21/21 0859 12/21/21 1000 12/21/21 1100  BP: (!) 161/65  (!) 161/62 (!) 100/41  Pulse: (!) 104  (!) 107 78  Resp: (!) 32  (!) 25 18  Temp: 97.7 F (36.5 C)     TempSrc: Axillary     SpO2: 90% 92% 93% 96%  Weight:      Height:        Intake/Output Summary (Last 24 hours) at 12/21/2021 1302 Last data filed at 12/21/2021 1045 Gross per 24 hour  Intake 10 ml  Output 945 ml  Net -935 ml    Filed Weights   12/18/21 0438 12/20/21 0500 12/21/21 0500  Weight: 54.5 kg 53.8 kg 51.6 kg    Examination: General exam: Awake, laying in bed, in nad Respiratory system: Increased respiratory effort, no wheezing Cardiovascular system: regular rate, s1, s2 Gastrointestinal system: Soft, nondistended, positive BS Central nervous system: CN2-12 grossly intact, strength intact Extremities: Perfused, no clubbing Skin: Normal skin turgor, no notable skin lesions seen Psychiatry: Unable to assess given mentation  Data Reviewed: I have personally reviewed following labs and imaging studies  CBC: Recent Labs  Lab 12/15/21 1351 12/16/21 0253 12/17/21 0244 12/19/21 0240 12/21/21 0239  WBC 13.3* 5.7 11.7* 10.2 7.2  NEUTROABS 11.3*  --  9.0*  --   --   HGB 16.0* 14.1 13.9 13.8 14.1  HCT 49.0* 44.1 44.7 46.1* 47.3*  MCV 90.6 92.3 95.7 98.7 98.3  PLT 312 287 296 271 096    Basic Metabolic Panel: Recent Labs  Lab 12/15/21 1351 12/16/21 0253 12/17/21 0244 12/18/21 0315 12/19/21 0240 12/20/21 0805 12/21/21 0239  NA 121*   < > 123* 123* 128* 128* 130*  K 3.5   < > 5.0 4.9 4.8 5.3* 5.3*  CL 76*   < > 85* 86* 84* 82* 83*  CO2 34*   < > 34* 32 40* 38* 38*  GLUCOSE 110*   < > 94 116* 111* 91 97  BUN 9   < > 17 12 11 18 20   CREATININE 0.67   < > 0.59 0.48 0.51 0.39* 0.41*  CALCIUM 8.7*   < > 8.6* 8.3* 8.5* 8.7* 8.8*  MG 1.9  --   --   --   --   --   --    < > = values in this interval not displayed.    GFR: Estimated Creatinine  Clearance: 34.9 mL/min (A) (by C-G formula based on SCr of 0.41 mg/dL (L)). Liver Function Tests: Recent Labs  Lab 12/16/21 0253 12/17/21 0244 12/19/21 0240 12/20/21 0805 12/21/21 0239  AST 19 18 14* 25 18  ALT 17 19 19 24 21   ALKPHOS 67 62 56 62 58  BILITOT 0.8 0.7 0.6 0.9 1.1  PROT 6.0* 6.0* 5.7* 5.8* 5.8*  ALBUMIN 3.5 3.5 3.3* 3.4* 3.4*    No results for input(s): LIPASE, AMYLASE in the last 168 hours. No results for input(s): AMMONIA in the last 168 hours. Coagulation Profile: Recent Labs  Lab 12/16/21 0253  INR 1.1    Cardiac Enzymes: No results for input(s): CKTOTAL, CKMB, CKMBINDEX, TROPONINI in the last 168 hours. BNP (last 3 results) No  results for input(s): PROBNP in the last 8760 hours. HbA1C: No results for input(s): HGBA1C in the last 72 hours. CBG: No results for input(s): GLUCAP in the last 168 hours. Lipid Profile: No results for input(s): CHOL, HDL, LDLCALC, TRIG, CHOLHDL, LDLDIRECT in the last 72 hours. Thyroid Function Tests: No results for input(s): TSH, T4TOTAL, FREET4, T3FREE, THYROIDAB in the last 72 hours.  Anemia Panel: No results for input(s): VITAMINB12, FOLATE, FERRITIN, TIBC, IRON, RETICCTPCT in the last 72 hours. Sepsis Labs: No results for input(s): PROCALCITON, LATICACIDVEN in the last 168 hours.  Recent Results (from the past 240 hour(s))  Resp Panel by RT-PCR (Flu A&B, Covid) Nasopharyngeal Swab     Status: None   Collection Time: 12/15/21  1:51 PM   Specimen: Nasopharyngeal Swab; Nasopharyngeal(NP) swabs in vial transport medium  Result Value Ref Range Status   SARS Coronavirus 2 by RT PCR NEGATIVE NEGATIVE Final    Comment: (NOTE) SARS-CoV-2 target nucleic acids are NOT DETECTED.  The SARS-CoV-2 RNA is generally detectable in upper respiratory specimens during the acute phase of infection. The lowest concentration of SARS-CoV-2 viral copies this assay can detect is 138 copies/mL. A negative result does not preclude  SARS-Cov-2 infection and should not be used as the sole basis for treatment or other patient management decisions. A negative result may occur with  improper specimen collection/handling, submission of specimen other than nasopharyngeal swab, presence of viral mutation(s) within the areas targeted by this assay, and inadequate number of viral copies(<138 copies/mL). A negative result must be combined with clinical observations, patient history, and epidemiological information. The expected result is Negative.  Fact Sheet for Patients:  EntrepreneurPulse.com.au  Fact Sheet for Healthcare Providers:  IncredibleEmployment.be  This test is no t yet approved or cleared by the [place] FDA and  has been authorized for detection and/or diagnosis of SARS-CoV-2 by FDA under an Emergency Use Authorization (EUA). This EUA will remain  in effect (meaning this test can be used) for the duration of the COVID-19 declaration under Section 564(b)(1) of the Act, 21 U.S.C.section 360bbb-3(b)(1), unless the authorization is terminated  or revoked sooner.       Influenza A by PCR NEGATIVE NEGATIVE Final   Influenza B by PCR NEGATIVE NEGATIVE Final    Comment: (NOTE) The Xpert Xpress SARS-CoV-2/FLU/RSV plus assay is intended as an aid in the diagnosis of influenza from Nasopharyngeal swab specimens and should not be used as a sole basis for treatment. Nasal washings and aspirates are unacceptable for Xpert Xpress SARS-CoV-2/FLU/RSV testing.  Fact Sheet for Patients: EntrepreneurPulse.com.au  Fact Sheet for Healthcare Providers: IncredibleEmployment.be  This test is not yet approved or cleared by the [place] FDA and has been authorized for detection and/or diagnosis of SARS-CoV-2 by FDA under an Emergency Use Authorization (EUA). This EUA will remain in effect (meaning this test can be used) for the duration of  the COVID-19 declaration under Section 564(b)(1) of the Act, 21 U.S.C. section 360bbb-3(b)(1), unless the authorization is terminated or revoked.  Performed at [facility], [place] [address], [place], [place] [address]   MRSA Next Gen by PCR, Nasal     Status: None   Collection Time: 12/15/21  5:44 PM   Specimen: Nasal Mucosa; Nasal Swab  Result Value Ref Range Status   MRSA by PCR Next Gen NOT DETECTED NOT DETECTED Final    Comment: (NOTE) The GeneXpert MRSA Assay (FDA approved for NASAL specimens only), is one component of a comprehensive MRSA colonization  surveillance program. It is not intended to diagnose MRSA infection nor to guide or monitor treatment for MRSA infections. Test performance is not FDA approved in patients less than 32 years old. Performed at [facility], [place] [address] [place], [place] [address]   Respiratory (~20 pathogens) panel by PCR     Status: None   Collection Time: 12/16/21 12:09 PM   Specimen: Nasopharyngeal Swab; Respiratory  Result Value Ref Range Status   Adenovirus NOT DETECTED NOT DETECTED Final   Coronavirus 229E NOT DETECTED NOT DETECTED Final    Comment: (NOTE) The Coronavirus on the Respiratory Panel, DOES NOT test for the novel  Coronavirus (2019 nCoV)    Coronavirus HKU1 NOT DETECTED NOT DETECTED Final   Coronavirus NL63 NOT DETECTED NOT DETECTED Final   Coronavirus OC43 NOT DETECTED NOT DETECTED Final   Metapneumovirus NOT DETECTED NOT DETECTED Final   Rhinovirus / Enterovirus NOT DETECTED NOT DETECTED Final   Influenza A NOT DETECTED NOT DETECTED Final   Influenza B NOT DETECTED NOT DETECTED Final   Parainfluenza Virus 1 NOT DETECTED NOT DETECTED Final   Parainfluenza Virus 2 NOT DETECTED NOT DETECTED Final   Parainfluenza Virus 3 NOT DETECTED NOT DETECTED Final   Parainfluenza Virus 4 NOT DETECTED NOT DETECTED Final   Respiratory Syncytial Virus NOT DETECTED NOT DETECTED Final   Bordetella  pertussis NOT DETECTED NOT DETECTED Final   Bordetella Parapertussis NOT DETECTED NOT DETECTED Final   Chlamydophila pneumoniae NOT DETECTED NOT DETECTED Final   Mycoplasma pneumoniae NOT DETECTED NOT DETECTED Final    Comment: Performed at [facility], [place]. [address], [place], [place] [address]      Radiology Studies: DG Abd 1 View  Result Date: 12/19/2021 CLINICAL DATA:  Abdominal pain. EXAM: ABDOMEN - 1 VIEW COMPARISON:  None. FINDINGS: The bowel gas pattern is normal. No radio-opaque calculi or other significant radiographic abnormality are seen. IMPRESSION: Negative. Electronically Signed   By: Marijo Conception M.D.   On: 12/19/2021 14:38   DG CHEST PORT 1 VIEW  Result Date: 12/21/2021 CLINICAL DATA:  Tachypnea, COPD EXAM: PORTABLE CHEST 1 VIEW COMPARISON:  12/19/2021 chest radiograph. FINDINGS: Clips overlie the lower chest bilaterally. Stable cardiomediastinal silhouette with top-normal heart size. No pneumothorax. Small bilateral pleural effusions, stable. No overt pulmonary edema. Left lower lobe atelectasis, unchanged. IMPRESSION: Stable small bilateral pleural effusions and left lower lobe atelectasis. Electronically Signed   By: Ilona Sorrel M.D.   On: 12/21/2021 09:32    Scheduled Meds:  amLODipine  2.5 mg Oral Daily   arformoterol  15 mcg Nebulization BID   budesonide (PULMICORT) nebulizer solution  0.5 mg Nebulization Q12H   chlorhexidine  15 mL Mouth Rinse BID   Chlorhexidine Gluconate Cloth  6 each Topical Daily   enoxaparin (LOVENOX) injection  40 mg Subcutaneous Q24H   guaiFENesin  600 mg Oral BID   levothyroxine  50 mcg Oral QAC breakfast   mouth rinse  15 mL Mouth Rinse q12n4p   methylPREDNISolone (SOLU-MEDROL) injection  60 mg Intravenous Q8H   pantoprazole  40 mg Oral Daily   revefenacin  175 mcg Nebulization Daily   sodium chloride  1 g Oral BID WC   Continuous Infusions:  sodium chloride 10 mL/hr at 12/21/21 1235   calcium gluconate 1,000 mg (12/21/21  1238)     LOS: 6 days   Marylu Lund, MD [facility] Pager On Amion  If 7PM-7AM, please contact night-coverage 12/21/2021, 1:02 PM

## 2021-12-21 NOTE — Progress Notes (Signed)
NAME:  Victoria Rice, MRN:  [id number], DOB:  [birth year], LOS: 6 ADMISSION DATE:  12/15/2021, CONSULTATION DATE:  1/8 REFERRING MD:  Cresenciano Lick, CHIEF COMPLAINT:  sob    BRIEF  82 yowf remote smoker with GOLD 3 copd dx 07/18/16 with reversibility and nl dlco but declined f/u by Dr Carlis Abbott and now admitted by [facility] with dx of aecopd and PCCM asked to eval pm 1/8 p declining referral 11/06/21 to return to pulmonary office for new dx of hypoxemic resp failure and breathing worse since ? Nov 2022 but esp x 10 d PTA      Seen several weeks PTA in oncology office was short of breath declined work-up-had recently been placed on Norvasc for blood pressure but this was complicated by lower extremity edema and hence was placed on Lasix-they discontinued Norvasc recently     She was wheezing apparently according to EMS given albuterol 04/07/2024 Solu-Medrol placed on 3 L nasal cannula   Pertinent  Medical History  Breast cancer, Hypothyroidism, Lt lower lobe carcinoid tumor Breast ca no chemo or RT LLL carcinoid on navigational bx 08/2016 followed by [facility] conservatively    Latest Reference Range & Units 12/19/21 10:43 12/21/21 09:59  pH, Arterial 7.350 - 7.450  7.218 (L) 7.298 (L)  pCO2 arterial 32.0 - 48.0 mmHg 106 (HH) 93.4 (HH)  pO2, Arterial 83.0 - 108.0 mmHg 169 (H) 54.8 (L)  (HH): Data is critically high (L): Data is abnormally low (H): Data is abnormally high  PFT Results Latest Ref Rng & Units 07/18/2016  FVC-Pre L 0.96  FVC-Predicted Pre % 50  FVC-Post L 1.24  FVC-Predicted Post % 65  Pre FEV1/FVC % % 49  Post FEV1/FCV % % 48  FEV1-Pre L 0.47  FEV1-Predicted Pre % 33  FEV1-Post L 0.60  DLCO uncorrected ml/min/mmHg 14.13  DLCO UN[facility]% % 80  DLVA Predicted % 167  TLC L 5.06  TLC % Predicted % 117  RV % Predicted % 170     Studies:  PFT 07/18/16 >> FEV1 0.6 (42%), FEV1% 48, TLC 5.06 (117%), DLCO 80%, +BD Echo 09/26/19 >> EF 60 to 65% CT chest 09/10/21 >> smooth margin ovoid  nodule LLL 1.9 x 1.0 cm, multiple smaller nodules stable, scattered cylindrical and varicose BTX mostly in RML and lingula  SUBJECTIVE/OVERNIGHT/INTERVAL HX  1/8 - Admit on Bipap with severe hypoxemic and hypercarbic Resp failure . CCM consult - Rx as AECOPD but most likely this ia AE-bronchiectasis.  According to the nurse walking in the ICU. 1/11 - Had trouble sleeping last night and had trouble controlling her bladder last night.  Breathing okay.  Not having cough and doesn't feel wheezy. CCM signed off  Interim History / Subjective:   1/14 -CCMS to sign back because of worsening hypercapnia.  According to bedside nurse on Monday, 12/16/2021 patient was ambulating on the hallway into [facility] ICU.  The same nursing the patient since then today and patient is definitely more obtunded and is on BiPAP.  However the nurses per report patient's obtundation is somewhat better compared to last night 12/20/2021 and patient is more awake with continuous BiPAP.  Nurse also indicates patient's been on 6 L nasal cannula recently with extremely good pulse ox currently mildly hyponatremic and also hyperkalemic.  According to the pharmacist since 12/18/2021 no opioids given and only 1 dose of Xanax 12/19/2021 low-dose.  According to the son patient has not lost weight.  She is always thin and frail.  She is driving  the car as recently as 1 week prior to admission but in the last week she he has noticed tiredness and intermittent confusion.  He suspect she has chronic hypercapnia. Baseline co2 even in 2017: 31-32. No ABG  Objective   Blood pressure (!) 161/62, pulse (!) 107, temperature 97.7 F (36.5 C), temperature source Axillary, resp. rate (!) 25, height 5' (1.524 m), weight 51.6 kg, SpO2 93 %.    FiO2 (%):  [30 %-40 %] 40 %   Intake/Output Summary (Last 24 hours) at 12/21/2021 1031 Last data filed at 12/21/2021 0800 Gross per 24 hour  Intake 10 ml  Output 870 ml  Net -860 ml   Filed Weights   12/18/21 0438  12/20/21 0500 12/21/21 0500  Weight: 54.5 kg 53.8 kg 51.6 kg     General Appearance:  Looks criticall ill.  Frail female lying in the bed on BiPAP Head:  Normocephalic, without obvious abnormality, atraumatic Eyes:  PERRL - eys, conjunctiva/corneas - muddy     Ears:  Normal external ear canals, both ears Nose:  G tube - no Throat:  ETT TUBE - no , OG tube - no.  Has fullface BiPAP on Neck:  Supple,  No enlargement/tenderness/nodules Lungs: Clear to auscultation bilaterally, but distant breath sounds without any wheeze Heart:  S1 and S2 normal, no murmur, CVP - no.  Pressors - n Abdomen:  Soft, no masses, no organomegaly Genitalia / Rectal:  Not done Extremities:  Extremities-intact Skin:  ntact in exposed areas . Sacral area -unable to examine Neurologic:  Sedation -none-> RASS --3. Moves all 4s -yes when aroused. CAM-ICU -cannot test. Orientation -unable to test     Assessment & Plan:   Acute hypoxic and hypercapnic respiratory failure from "COPD" and bronchiectasis exacerbation.   - 12/21/2021: CCM signed off 12/18/2021.  Subsequent ABG with significant acute on chronic respiratory acidosis hypercapnia with hypoxemia.  Multiplex respiratory virus panel negative patient on BiPAP.  Worsening encephalopathy and obtundation correlating with hypercapnia 01/06/2022 night.  Persistent but slightly improved encephalopathy correlating with slight improvement in hypercapnia 12/21/2021 and CCM recalled.  ABG itself shows improvement compared to 48 hours earlier although she is clearly worse compared to several days earlier  Differential diagnosis includes a) unrecognized chronic hypercapnia with acute recognition; b) decompensation due to interval development of small effusions in the hospital versus relative?  High oxygen administration; OR other unrecognized causes including rapid steroid taper  Plan  - Continue BiPAP 24/7 -> will need discharge BiPAP assessment -Check acetylcholine receptor  antibody -  -Check echocardiogram -Check BNP/troponin -Check lactic acid/procalcitonin/urine strep/urine Legionella -Continue Brovana Pulmicort and Yupelri - - adjust oxygen to keep SpO2 > 90%; assess for home oxygen needs prior to discharge = Increase Solu-Medrol from 40 mg once daily to 60 mg 3 times daily -Clarify goals of care   Acute metabolic encephalopathy due to the above  Plan -Avoid benzos and opioids - Consider Precedex infusion if needed   LLL carcinoid tumor. - previously followed by Dr. Roxan Hockey with thoracic surgery - pt opted at visit on 09/10/21 to forego any further radiographic follow up   Hyponatremia.  Hyperkalemia 12/21/21  Plan -Lokelma, albuterol nebulizer - per primary team -Follow-up on electrolytes     Best practice:  Diet: N.p.o. except medications but nursing directed to put on encephalopathy  Pain/Anxiety/Delirium protocol (if indicated): Avoiding benzos and opioids can use Precedex if needed  VAP protocol (if indicated): Head of bed greater than 30  DVT prophylaxis: According to  hospitalist  GI prophylaxis: PPI according to hospitalist  Glucose control: SSI according to hospitalist  Mobility: Bedrest  Code Status: Full code  Family Communication: Son at the bedside updated -> goals of care discussed 12/21/2021 in the presence of bedside nurse.  Son indicates that sister is the [place].  This is when he usually quite well aligned.  He believes that patient is a do  not resuscitate and DO NOT INTUBATE but he has to check with his sister.  I discussed with [facility] hospitalist will follow up with the family.  I did indicate that CPR is probably not in her best interest.  Disposition: Stepdown/ICU    Future Appointments  Date Time Provider [place]  12/21/2021 12:00 PM [facility] E[facility] 1-[facility] [place]  12/30/2021  2:00 PM Parrett, Fonnie Mu, NP [facility] None        ATTESTATION & SIGNATURE   The patient Victoria Rice is  critically ill with multiple organ systems failure and requires high complexity decision making for assessment and support, frequent evaluation and titration of therapies, application of advanced monitoring technologies and extensive interpretation of multiple databases.   Critical Care Time devoted to patient care services described in this note is  45  Minutes. This time reflects time of care of this signee Dr Brand Males. This critical care time does not reflect procedure time, or teaching time or supervisory time of PA/NP/Med student/Med Resident etc but could involve care discussion time     Dr. Brand Males, M.D., [facility].C.P Pulmonary and Critical Care Medicine Staff Physician [facility] Pager: [phone], If no answer or between  15:00h - 7:00h: call [phone]  12/21/2021 10:31 AM     LABS    PULMONARY Recent Labs  Lab 12/15/21 1351 12/19/21 1043 12/21/21 0959  PHART  --  7.218* 7.298*  PCO2ART  --  106* 93.4*  PO2ART  --  169* 54.8*  HCO3 36.8* 41.7* 44.4*  O2SAT 57.0 99.4 86.6    CBC Recent Labs  Lab 12/17/21 0244 12/19/21 0240 12/21/21 0239  HGB 13.9 13.8 14.1  HCT 44.7 46.1* 47.3*  WBC 11.7* 10.2 7.2  PLT 296 271 232    COAGULATION Recent Labs  Lab 12/16/21 0253  INR 1.1    CARDIAC  No results for input(s): TROPONINI in the last 168 hours. No results for input(s): PROBNP in the last 168 hours.   CHEMISTRY Recent Labs  Lab 12/15/21 1351 12/16/21 0253 12/17/21 0244 12/18/21 0315 12/19/21 0240 12/20/21 0805 12/21/21 0239  NA 121*   < > 123* 123* 128* 128* 130*  K 3.5   < > 5.0 4.9 4.8 5.3* 5.3*  CL 76*   < > 85* 86* 84* 82* 83*  CO2 34*   < > 34* 32 40* 38* 38*  GLUCOSE 110*   < > 94 116* 111* 91 97  BUN 9   < > 17 12 11 18 20   CREATININE 0.67   < > 0.59 0.48 0.51 0.39* 0.41*  CALCIUM 8.7*   < > 8.6* 8.3* 8.5* 8.7* 8.8*  MG 1.9  --   --   --   --   --   --    < > = values in  this interval not displayed.   Estimated Creatinine Clearance: 34.9 mL/min (A) (by C-G formula based on SCr of 0.41 mg/dL (L)).   LIVER Recent Labs  Lab 12/16/21 0253 12/17/21 0244  12/19/21 0240 12/20/21 0805 12/21/21 0239  AST 19 18 14* 25 18  ALT 17 19 19 24 21   ALKPHOS 67 62 56 62 58  BILITOT 0.8 0.7 0.6 0.9 1.1  PROT 6.0* 6.0* 5.7* 5.8* 5.8*  ALBUMIN 3.5 3.5 3.3* 3.4* 3.4*  INR 1.1  --   --   --   --      INFECTIOUS No results for input(s): LATICACIDVEN, PROCALCITON in the last 168 hours.   ENDOCRINE CBG (last 3)  No results for input(s): GLUCAP in the last 72 hours.       IMAGING x48h  - image(s) personally visualized  -   highlighted in bold DG Abd 1 View  Result Date: 12/19/2021 CLINICAL DATA:  Abdominal pain. EXAM: ABDOMEN - 1 VIEW COMPARISON:  None. FINDINGS: The bowel gas pattern is normal. No radio-opaque calculi or other significant radiographic abnormality are seen. IMPRESSION: Negative. Electronically Signed   By: Marijo Conception M.D.   On: 12/19/2021 14:38   DG CHEST PORT 1 VIEW  Result Date: 12/21/2021 CLINICAL DATA:  Tachypnea, COPD EXAM: PORTABLE CHEST 1 VIEW COMPARISON:  12/19/2021 chest radiograph. FINDINGS: Clips overlie the lower chest bilaterally. Stable cardiomediastinal silhouette with top-normal heart size. No pneumothorax. Small bilateral pleural effusions, stable. No overt pulmonary edema. Left lower lobe atelectasis, unchanged. IMPRESSION: Stable small bilateral pleural effusions and left lower lobe atelectasis. Electronically Signed   By: Ilona Sorrel M.D.   On: 12/21/2021 09:32

## 2021-12-21 NOTE — Progress Notes (Signed)
Echocardiogram 2D Echocardiogram has been performed.  Victoria Rice 12/21/2021, 1:30 PM

## 2021-12-22 DIAGNOSIS — J9602 Acute respiratory failure with hypercapnia: Secondary | ICD-10-CM | POA: Diagnosis not present

## 2021-12-22 LAB — COMPREHENSIVE METABOLIC PANEL
ALT: 20 U/L (ref 0–44)
AST: 15 U/L (ref 15–41)
Albumin: 3.2 g/dL — ABNORMAL LOW (ref 3.5–5.0)
Alkaline Phosphatase: 53 U/L (ref 38–126)
Anion gap: 10 (ref 5–15)
BUN: 21 mg/dL (ref 8–23)
CO2: 39 mmol/L — ABNORMAL HIGH (ref 22–32)
Calcium: 8.6 mg/dL — ABNORMAL LOW (ref 8.9–10.3)
Chloride: 82 mmol/L — ABNORMAL LOW (ref 98–111)
Creatinine, Ser: 0.46 mg/dL (ref 0.44–1.00)
GFR, Estimated: >60 mL/min (ref >60)
Glucose, Bld: 142 mg/dL — ABNORMAL HIGH (ref 70–99)
Potassium: 4.8 mmol/L (ref 3.5–5.1)
Sodium: 131 mmol/L — ABNORMAL LOW (ref 135–145)
Total Bilirubin: 0.7 mg/dL (ref 0.3–1.2)
Total Protein: 5.5 g/dL — ABNORMAL LOW (ref 6.5–8.1)

## 2021-12-22 LAB — PROCALCITONIN: Procalcitonin: <0.1 ng/mL

## 2021-12-22 LAB — PHOSPHORUS: Phosphorus: 4.6 mg/dL (ref 2.5–4.6)

## 2021-12-22 LAB — TROPONIN I (HIGH SENSITIVITY): Troponin I (High Sensitivity): 19 ng/L — ABNORMAL HIGH (ref <18)

## 2021-12-22 LAB — CBC
HCT: 47.2 % — ABNORMAL HIGH (ref 36.0–46.0)
Hemoglobin: 14.4 g/dL (ref 12.0–15.0)
MCH: 29.6 pg (ref 26.0–34.0)
MCHC: 30.5 g/dL (ref 30.0–36.0)
MCV: 97.1 fL (ref 80.0–100.0)
Platelets: 207 10*3/uL (ref 150–400)
RBC: 4.86 MIL/uL (ref 3.87–5.11)
RDW: 12.9 % (ref 11.5–15.5)
WBC: 5.9 10*3/uL (ref 4.0–10.5)
nRBC: 0 % (ref 0.0–0.2)

## 2021-12-22 LAB — MAGNESIUM: Magnesium: 2 mg/dL (ref 1.7–2.4)

## 2021-12-22 MED ORDER — TRAZODONE HCL 50 MG PO TABS
50.0000 mg | ORAL_TABLET | Freq: Every day | ORAL | Status: DC
  Administered 2021-12-22 – 2021-12-29 (×8): 50 mg via ORAL
  Filled 2021-12-22 (×8): qty 1

## 2021-12-22 NOTE — Progress Notes (Signed)
NAME:  Victoria Rice, MRN:  [id number], DOB:  January 10, 1931, LOS: 7 ADMISSION DATE:  12/15/2021, CONSULTATION DATE:  1/8 REFERRING MD:  Cresenciano Lick, CHIEF COMPLAINT:  sob    BRIEF  5 yowf remote smoker with GOLD 3 copd dx 07/18/16 with reversibility and nl dlco but declined f/u by Dr Carlis Abbott and now admitted by [facility] with dx of aecopd and PCCM asked to eval pm 1/8 p declining referral 11/06/21 to return to pulmonary office for new dx of hypoxemic resp failure and breathing worse since ? Nov 2022 but esp x 10 d PTA      Seen several weeks PTA in oncology office was short of breath declined work-up-had recently been placed on Norvasc for blood pressure but this was complicated by lower extremity edema and hence was placed on Lasix-they discontinued Norvasc recently     She was wheezing apparently according to EMS given albuterol 04/07/2024 Solu-Medrol placed on 3 L nasal cannula   Pertinent  Medical History  Breast cancer, Hypothyroidism, Lt lower lobe carcinoid tumor Breast ca no chemo or RT LLL carcinoid on navigational bx 08/2016 followed by Roxan Hockey conservatively     Studies:  PFT 07/18/16 >> FEV1 0.6 (42%), FEV1% 48, TLC 5.06 (117%), DLCO 80%, +BD Echo 09/26/19 >> EF 60 to 65% CT chest 09/10/21 >> smooth margin ovoid nodule LLL 1.9 x 1.0 cm, multiple smaller nodules stable, scattered cylindrical and varicose BTX mostly in RML and lingula  SUBJECTIVE/OVERNIGHT/INTERVAL HX  1/8 - Admit on Bipap with severe hypoxemic and hypercarbic Resp failure . CCM consult - Rx as AECOPD but most likely this ia AE-bronchiectasis.  According to the nurse walking in the ICU. 1/11 - Had trouble sleeping last night and had trouble controlling her bladder last night.  Breathing okay.  Not having cough and doesn't feel wheezy. CCM signed off  1/14 -CCMS to sign back because of worsening hypercapnia.  According to bedside nurse on Monday, 12/16/2021 patient was ambulating on the hallway into [facility] ICU.  The same  nursing the patient since then today and patient is definitely more obtunded and is on BiPAP.  However the nurses per report patient's obtundation is somewhat better compared to last night 12/20/2021 and patient is more awake with continuous BiPAP.  Nurse also indicates patient's been on 6 L nasal cannula recently with extremely good pulse ox currently mildly hyponatremic and also hyperkalemic.  According to the pharmacist since 12/18/2021 no opioids given and only 1 dose of Xanax 12/19/2021 low-dose. According to the son patient has not lost weight.  She is always thin and frail.  She is driving the car as recently as 1 week prior to admission but in the last week she he has noticed tiredness and intermittent confusion.  He suspect she has chronic hypercapnia. Baseline co2 even in 2017: 31-32. No ABG  Interim History / Subjective:   1/15 -improved blood gas and normall mental status with continued BiPAP.  Low phosphorus was repleted yesterday.  Procalcitonin less than 0.1.  Lactic acid normal.  Urine strep negative.  Echocardiogram with grade 1 diastolic dysfunction and mild cor pulmonale  Objective   Blood pressure (!) 139/49, pulse 94, temperature 99.1 F (37.3 C), temperature source Axillary, resp. rate (!) 22, height 5' (1.524 m), weight 51.6 kg, SpO2 95 %.    FiO2 (%):  [35 %] 35 %   Intake/Output Summary (Last 24 hours) at 12/22/2021 1134 Last data filed at 12/21/2021 1800 Gross per 24 hour  Intake 94.05  ml  Output 75 ml  Net 19.05 ml   Filed Weights   12/18/21 0438 12/20/21 0500 12/21/21 0500  Weight: 54.5 kg 53.8 kg 51.6 kg   General Appearance:  Looks deconditioined.. frail, awake, looks better Head:  Normocephalic, without obvious abnormality, atraumatic Eyes:  PERRL - yes, conjunctiva/corneas - mudd     Ears:  Normal external ear canals, both ears Nose:  G tube - No but has [place] Throat:  ETT TUBE - no , OG tube - no Neck:  Supple,  No enlargement/tenderness/nodules Lungs: Clear to  auscultation bilaterally,  Heart:  S1 and S2 normal, no murmur, CVP - no.  Pressors - no Abdomen:  Soft, no masses, no organomegaly Genitalia / Rectal:  Not done Extremities:  Extremities- intact Skin:  ntact in exposed areas . Sacral area - not examined Neurologic:  Sedation - none -> RASS - +1 . Moves all 4s - yes. CAM-ICU - neg . Orientation - x3+        Assessment & Plan:   Acute hypoxic and hypercapnic respiratory failure from "COPD" and bronchiectasis exacerbation.   - 12/21/2021:  worse ifferential diagnosis includes a) unrecognized chronic hypercapnia with acute recognition; OR decompensation due to interval development of small effusions in the hospital OR relative ?  High oxygen administration; OR other unrecognized causes including rapid steroid taper OR moderate hypophosphatemia or some combination   1/15 -much improved blood gas and normal mental status now with phosphorus replacement and also BiPAP  Plan -Do BiPAP 1 time in the daytime for 2 hours and slowly wean off daytime use -Definite mandated nighttime BiPAP 0> we will need ABG 1 day before discharge in the hypercapnic will need discharge BiPAP for home  -Await acetylcholine receptor antibody - -Continue Brovana Pulmicort and Yupelri - - adjust oxygen to keep SpO2 > 90%; assess for home oxygen needs prior to discharge = Solu-Medrol 60 mg 3 times daily -do 2-week steroid taper starting 12/23/2021 -Clarify goals of care   Acute metabolic encephalopathy due to the above  12/22/2021: Resolved  Plan -Avoid benzos and opioids - Consider Precedex infusion if needed   LLL carcinoid tumor. - previously followed by Dr. Roxan Hockey with thoracic surgery - pt opted at visit on 09/10/21 to forego any further radiographic follow up   Moderate hypophosphatemia repleted 12/21/2021 Mild hyperkalemia treated 12/21/2021  Plan  perrimary team -Follow-up on electrolytes  Physical deconditioning  Plan - PT/OT   Best  practice:  Diet: N.p.o. except medications but nursing directed to put on encephalopathy  Pain/Anxiety/Delirium protocol (if indicated): Avoiding benzos and opioids can use Precedex if needed  VAP protocol (if indicated): Head of bed greater than 30  DVT prophylaxis: According to hospitalist  GI prophylaxis: PPI according to hospitalist  Glucose control: SSI according to hospitalist  Mobility: Bedrest  Code Status: Full code  Family Communication:  12/21/2021 son at the bedside updated -> goals of care discussed 12/21/2021 in the presence of bedside nurse.  Son indicates that sister is the [place].  This is when he usually quite well aligned.  He believes that patient is a do  not resuscitate and DO NOT INTUBATE but he has to check with his sister.  I discussed with [facility] hospitalist will follow up with the family.  I did indicate that CPR is probably not in her best interest.  Disposition: Stepdown/ICU -> moved to progressive 12/22/2021    Future Appointments  Date Time Provider [place]  12/30/2021  2:00 PM Parrett,  Fonnie Mu, NP [facility] None         SIGNATURE    Dr. Brand Males, M.D., F.C.C.P,  Pulmonary and Critical Care Medicine Staff Physician, [place] Director - Interstitial Lung Disease  Program  Pulmonary [place] [facility] [place], [place], [address]  NPI Number:  NPI #[id number]  Pager: [phone], If no answer  -> Check AMION or Try [phone] Telephone (clinical office): [phone] Telephone (research): [phone]  11:54 AM 12/22/2021     LABS    PULMONARY Recent Labs  Lab 12/15/21 1351 12/19/21 1043 12/21/21 0959 12/21/21 1415  PHART  --  7.218* 7.298* 7.372  PCO2ART  --  106* 93.4* 77.5*  PO2ART  --  169* 54.8* 70.0*  HCO3 36.8* 41.7* 44.4* 44.0*  O2SAT 57.0 99.4 86.6 94.8    CBC Recent Labs  Lab 12/19/21 0240 12/21/21 0239 12/22/21 0230  HGB 13.8 14.1  14.4  HCT 46.1* 47.3* 47.2*  WBC 10.2 7.2 5.9  PLT 271 232 207    COAGULATION Recent Labs  Lab 12/16/21 0253  INR 1.1    CARDIAC  No results for input(s): TROPONINI in the last 168 hours. No results for input(s): PROBNP in the last 168 hours.   CHEMISTRY Recent Labs  Lab 12/15/21 1351 12/16/21 0253 12/19/21 0240 12/20/21 0805 12/21/21 0239 12/21/21 1232 12/21/21 1335 12/22/21 0230  NA 121*   < > 128* 128* 130*  --  130* 131*  K 3.5   < > 4.8 5.3* 5.3*  --  5.1 4.8  CL 76*   < > 84* 82* 83*  --  82* 82*  CO2 34*   < > 40* 38* 38*  --  41* 39*  GLUCOSE 110*   < > 111* 91 97  --  100* 142*  BUN 9   < > 11 18 20   --  20 21  CREATININE 0.67   < > 0.51 0.39* 0.41*  --  0.43* 0.46  CALCIUM 8.7*   < > 8.5* 8.7* 8.8*  --  8.9 8.6*  MG 1.9  --   --   --   --  2.3  --  2.0  PHOS  --   --   --   --   --   --  2.1* 4.6   < > = values in this interval not displayed.   Estimated Creatinine Clearance: 34.9 mL/min (by C-G formula based on SCr of 0.46 mg/dL).   LIVER Recent Labs  Lab 12/16/21 0253 12/17/21 0244 12/19/21 0240 12/20/21 0805 12/21/21 0239 12/22/21 0230  AST 19 18 14* 25 18 15   ALT 17 19 19 24 21 20   ALKPHOS 67 62 56 62 58 53  BILITOT 0.8 0.7 0.6 0.9 1.1 0.7  PROT 6.0* 6.0* 5.7* 5.8* 5.8* 5.5*  ALBUMIN 3.5 3.5 3.3* 3.4* 3.4* 3.2*  INR 1.1  --   --   --   --   --      INFECTIOUS Recent Labs  Lab 12/21/21 1232 12/21/21 1335 12/22/21 0230  LATICACIDVEN  --  1.1  --   PROCALCITON <0.10  --  <0.10     ENDOCRINE CBG (last 3)  No results for input(s): GLUCAP in the last 72 hours.       IMAGING x48h  - image(s) personally visualized  -   highlighted in bold DG CHEST PORT 1 VIEW  Result Date: 12/21/2021 CLINICAL  DATA:  Tachypnea, COPD EXAM: PORTABLE CHEST 1 VIEW COMPARISON:  12/19/2021 chest radiograph. FINDINGS: Clips overlie the lower chest bilaterally. Stable cardiomediastinal silhouette with top-normal heart size. No pneumothorax. Small  bilateral pleural effusions, stable. No overt pulmonary edema. Left lower lobe atelectasis, unchanged. IMPRESSION: Stable small bilateral pleural effusions and left lower lobe atelectasis. Electronically Signed   By: Ilona Sorrel M.D.   On: 12/21/2021 09:32   ECHOCARDIOGRAM COMPLETE  Result Date: 12/21/2021    ECHOCARDIOGRAM REPORT   Patient Name:   Victoria Rice Date of Exam: 12/21/2021 Medical Rec #:  [id number]         Height:       60.0 in Accession #:    [id number]        Weight:       113.8 lb Date of Birth:  [birth year]        BSA:          1.469 m Patient Age:    88 years          BP:           100/41 mmHg Patient Gender: F                 HR:           78 bpm. Exam Location:  Inpatient Procedure: 2D Echo Indications:    Acute Respiratory Distress  History:        Patient has prior history of Echocardiogram examinations, most                 recent 09/26/2019. COPD; Signs/Symptoms:Shortness of Breath.  Sonographer:    Arlyss Gandy Referring Phys: [place]  1. Left ventricular ejection fraction, by estimation, is 60 to 65%. The left ventricle has normal function. The left ventricle has no regional wall motion abnormalities. Left ventricular diastolic parameters are consistent with Grade I diastolic dysfunction (impaired relaxation).  2. Right ventricular systolic function is normal. The right ventricular size is dilated. There is mildly elevated pulmonary artery systolic pressure. The estimated right ventricular systolic pressure is 82.9 mmHg.  3. The mitral valve was not well visualized. No evidence of mitral valve regurgitation. No evidence of mitral stenosis.  4. The aortic valve was not well visualized. Aortic valve regurgitation is not visualized. No aortic stenosis is present.  5. The inferior vena cava is normal in size with <50% respiratory variability, suggesting right atrial pressure of 8 mmHg. Comparison(s): A prior study was performed on 09/26/2019. RVSP increased  from prior. FINDINGS  Left Ventricle: Left ventricular ejection fraction, by estimation, is 60 to 65%. The left ventricle has normal function. The left ventricle has no regional wall motion abnormalities. The left ventricular internal cavity size was small. There is no left ventricular hypertrophy. Left ventricular diastolic parameters are consistent with Grade I diastolic dysfunction (impaired relaxation). Right Ventricle: The right ventricular size is moderately enlarged. No increase in right ventricular wall thickness. Right ventricular systolic function is normal. There is mildly elevated pulmonary artery systolic pressure. The tricuspid regurgitant velocity is 2.73 m/s, and with an assumed right atrial pressure of 15 mmHg, the estimated right ventricular systolic pressure is 56.2 mmHg. Left Atrium: Left atrial size was normal in size. Right Atrium: Right atrial size was normal in size. Pericardium: There is no evidence of pericardial effusion. Mitral Valve: The mitral valve was not well visualized. No evidence of mitral valve regurgitation. No evidence of mitral valve stenosis. Tricuspid Valve: The tricuspid valve  is normal in structure. Tricuspid valve regurgitation is trivial. No evidence of tricuspid stenosis. Aortic Valve: The aortic valve was not well visualized. Aortic valve regurgitation is not visualized. No aortic stenosis is present. Aortic valve mean gradient measures 4.5 mmHg. Aortic valve peak gradient measures 8.3 mmHg. Aortic valve area, by VTI measures 2.65 cm. Pulmonic Valve: The pulmonic valve was not well visualized. Pulmonic valve regurgitation is not visualized. No evidence of pulmonic stenosis. Aorta: The aortic root and ascending aorta are structurally normal, with no evidence of dilitation. Venous: The inferior vena cava is normal in size with less than 50% respiratory variability, suggesting right atrial pressure of 8 mmHg. IAS/Shunts: No atrial level shunt detected by color flow  Doppler.  LEFT VENTRICLE PLAX 2D LVIDd:         3.30 cm   Diastology LVIDs:         2.20 cm   LV e' medial:    7.72 cm/s LV PW:         0.70 cm   LV E/e' medial:  12.4 LV IVS:        0.70 cm   LV e' lateral:   10.10 cm/s LVOT diam:     1.90 cm   LV E/e' lateral: 9.5 LV SV:         68 LV SV Index:   47 LVOT Area:     2.84 cm  RIGHT VENTRICLE             IVC RV S prime:     12.80 cm/s  IVC diam: 2.20 cm TAPSE (M-mode): 2.3 cm LEFT ATRIUM             Index        RIGHT ATRIUM           Index LA diam:        2.50 cm 1.70 cm/m   RA Area:     11.80 cm LA Vol (A2C):   22.3 ml 15.18 ml/m  RA Volume:   26.00 ml  17.70 ml/m LA Vol (A4C):   27.4 ml 18.66 ml/m LA Biplane Vol: 25.1 ml 17.09 ml/m  AORTIC VALVE AV Area (Vmax):    2.61 cm AV Area (Vmean):   2.61 cm AV Area (VTI):     2.65 cm AV Vmax:           144.00 cm/s AV Vmean:          98.500 cm/s AV VTI:            0.258 m AV Peak Grad:      8.3 mmHg AV Mean Grad:      4.5 mmHg LVOT Vmax:         132.50 cm/s LVOT Vmean:        90.800 cm/s LVOT VTI:          0.242 m LVOT/AV VTI ratio: 0.93  AORTA Ao Root diam: 2.30 cm Ao Asc diam:  2.10 cm MITRAL VALVE               TRICUSPID VALVE MV Area (PHT): 4.63 cm    TR Peak grad:   29.8 mmHg MV Decel Time: 164 msec    TR Vmax:        273.00 cm/s MV E velocity: 95.50 cm/s MV A velocity: 99.40 cm/s  SHUNTS MV E/A ratio:  0.96        Systemic VTI:  0.24 m  Systemic Diam: 1.90 cm Rudean Haskell MD Electronically signed by Rudean Haskell MD Signature Date/Time: 12/21/2021/2:43:51 PM    Final

## 2021-12-22 NOTE — Progress Notes (Addendum)
PROGRESS NOTE    Victoria Rice  HDQ:[id number] DOB: Sep 20, 1931 DOA: 12/15/2021 PCP: Faustino Congress, NP    Brief Narrative:  88 year old independent living dwelling white female Left upper outer breast cancer status postlumpectomy 2017 previously on anastrozole Diagnosed with neuroendocrine tumor of lung/pulmonary carcinoid Prior aberrant right subclavian artery Underlying PVCs, mild HFpEF based on echocardiogram 09/26/2019 EF 60 to 65% Severe prior COPD  with FEV1 of 0.4 followed by Dr. Sudie Grumbling   Seen several weeks ago in oncology office was short of breath declined work-up-had recently been placed on Norvasc for blood pressure but this was complicated by lower extremity edema and hence was placed on Lasix-they discontinued Norvasc recently   Report from family/patient-increasing shortness of breath no other new medications no anxiolytics pain meds recently She was so weak that they brought her to emergency room there she was found to have PCO2 of 80 sodium 121 BUN/creatinine 9/0.6 BNP of 66 COVID was negative CXR no abnormality--   Assessment & Plan:   Principal Problem:   Acute bronchitis with COPD ([place]) Active Problems:   Breast cancer of upper-outer quadrant of left female breast ([place])   COPD, severe ([place])   Neuroendocrine carcinoma of lung ([place])   Malignant neoplasm of unspecified part of unspecified bronchus or lung ([place])   Acute hypovolemic hyponatremia   Acute respiratory failure with hypercapnia ([place])  Acute type II respiratory failure with bronchitis/COPD exacerbation with hypercarbia Earlier had required BiPAP in the emergency room Respiratory viral panel negative On 1/12, pt appeared more lethargic with ABG demonstrating a pCO2 of over 100 Continued solumedrol with plan for 2 week steroid taper starting 12/23/21 Repeat ABG reviewed with pH remains 7.2 with pCO2 93 Appreciate input by PCCM. Recs$RemoveBefo' \\to'CMzozSsUNIl$  continue bipap 1 time in the daytime for 2hrs and  slowly wean off daytime use Patient states feeling tired of undergoing myriad of aggressive measures thus far but is not wanting to stop tx. Pt is very eager to discuss goals of care. Have consulted Palliative Care  Severe COPD FEV0.4 in 2017 Discontinued cefepime 1/9 ordered on admission  Completed course of cefdinir Continue albuterol every 6 as needed, Brovana 15 mcg twice daily, Pulmicort twice daily--pulmonary recommends Trelegy 1 puff daily on discharge to simplify things instead of all the other inhalers Recommend outpatient spirometry FEV1 etc. Per above, continued BiPAP with steroids increased Mild chest pain No overt concerns for DVT noted HTN Continued amlodipine 2.5, can use as needed metoprolol 5 mg every 6 as needed for pressure >170 Remains stable thus far Acute hypovolemic hyponatremia Salt tabs were added recently  Serum osm 251 with urine osm around 350 with urine Na of 24  Sodium up to 131 today Mild metabolic alkalosis ruled out. Respiratory acidosis secondary to hypercarbic respiratory failure ruled in  pH of 7.2 with elevated pCO2 per above  Pt is continued on bipap per above with steroids   Prior breast cancer, neuroendocrine tumor of the lung Outpatient follow-up with pulmonary/oncology as needed  DVT prophylaxis: Lovenox subq Code Status: Full Family Communication: Pt in room, family not at bedside  Status is: Inpatient  Remains inpatient appropriate because: Severity of illness   Consultants:  PCCM  Procedures:    Antimicrobials: Anti-infectives (From admission, onward)    Start     Dose/Rate Route Frequency Ordered Stop   12/16/21 2200  cefdinir (OMNICEF) capsule 300 mg        300 mg Oral Every 12 hours 12/16/21 1628 12/19/21 2359  12/15/21 2000  ceFEPIme (MAXIPIME) 2 g in sodium chloride 0.9 % 100 mL IVPB  Status:  Discontinued        2 g 200 mL/hr over 30 Minutes Intravenous Every 12 hours 12/15/21 1852 12/16/21 1628   12/15/21 1615   ceFEPIme (MAXIPIME) 2 g in sodium chloride 0.9 % 100 mL IVPB  Status:  Discontinued        2 g 200 mL/hr over 30 Minutes Intravenous Every 8 hours 12/15/21 1606 12/15/21 1852       Subjective:  Reports breathing better. More alert and oriented  Objective: Vitals:   12/22/21 0904 12/22/21 1000 12/22/21 1100 12/22/21 1200  BP: (!) 169/55 (!) 139/49 (!) 152/49 (!) 147/52  Pulse:  94 99 97  Resp:  (!) 22 (!) 25 (!) 21  Temp:    98 F (36.7 C)  TempSrc:    Axillary  SpO2:  95% 91% 93%  Weight:      Height:        Intake/Output Summary (Last 24 hours) at 12/22/2021 1521 Last data filed at 12/21/2021 1800 Gross per 24 hour  Intake 30.26 ml  Output --  Net 30.26 ml    Filed Weights   12/18/21 0438 12/20/21 0500 12/21/21 0500  Weight: 54.5 kg 53.8 kg 51.6 kg    Examination: General exam: Conversant, in no acute distress Respiratory system: normal chest rise, clear, no audible wheezing Cardiovascular system: regular rhythm, s1-s2 Gastrointestinal system: Nondistended, nontender, pos BS Central nervous system: No seizures, no tremors Extremities: No cyanosis, no joint deformities Skin: No rashes, no pallor Psychiatry: Affect normal // no auditory hallucinations   Data Reviewed: I have personally reviewed following labs and imaging studies  CBC: Recent Labs  Lab 12/16/21 0253 12/17/21 0244 12/19/21 0240 12/21/21 0239 12/22/21 0230  WBC 5.7 11.7* 10.2 7.2 5.9  NEUTROABS  --  9.0*  --   --   --   HGB 14.1 13.9 13.8 14.1 14.4  HCT 44.1 44.7 46.1* 47.3* 47.2*  MCV 92.3 95.7 98.7 98.3 97.1  PLT 287 296 271 232 720    Basic Metabolic Panel: Recent Labs  Lab 12/19/21 0240 12/20/21 0805 12/21/21 0239 12/21/21 1232 12/21/21 1335 12/22/21 0230  NA 128* 128* 130*  --  130* 131*  K 4.8 5.3* 5.3*  --  5.1 4.8  CL 84* 82* 83*  --  82* 82*  CO2 40* 38* 38*  --  41* 39*  GLUCOSE 111* 91 97  --  100* 142*  BUN 11 18 20   --  20 21  CREATININE 0.51 0.39* 0.41*  --   0.43* 0.46  CALCIUM 8.5* 8.7* 8.8*  --  8.9 8.6*  MG  --   --   --  2.3  --  2.0  PHOS  --   --   --   --  2.1* 4.6    GFR: Estimated Creatinine Clearance: 34.9 mL/min (by C-G formula based on SCr of 0.46 mg/dL). Liver Function Tests: Recent Labs  Lab 12/17/21 0244 12/19/21 0240 12/20/21 0805 12/21/21 0239 12/22/21 0230  AST 18 14* 25 18 15   ALT 19 19 24 21 20   ALKPHOS 62 56 62 58 53  BILITOT 0.7 0.6 0.9 1.1 0.7  PROT 6.0* 5.7* 5.8* 5.8* 5.5*  ALBUMIN 3.5 3.3* 3.4* 3.4* 3.2*    No results for input(s): LIPASE, AMYLASE in the last 168 hours. No results for input(s): AMMONIA in the last 168 hours. Coagulation Profile: Recent Labs  Lab 12/16/21 0253  INR 1.1    Cardiac Enzymes: Recent Labs  Lab 12/21/21 1335  CKTOTAL 23*  CKMB 1.7   BNP (last 3 results) No results for input(s): PROBNP in the last 8760 hours. HbA1C: No results for input(s): HGBA1C in the last 72 hours. CBG: No results for input(s): GLUCAP in the last 168 hours. Lipid Profile: No results for input(s): CHOL, HDL, LDLCALC, TRIG, CHOLHDL, LDLDIRECT in the last 72 hours. Thyroid Function Tests: No results for input(s): TSH, T4TOTAL, FREET4, T3FREE, THYROIDAB in the last 72 hours.  Anemia Panel: No results for input(s): VITAMINB12, FOLATE, FERRITIN, TIBC, IRON, RETICCTPCT in the last 72 hours. Sepsis Labs: Recent Labs  Lab 12/21/21 1232 12/21/21 1335 12/22/21 0230  PROCALCITON <0.10  --  <0.10  LATICACIDVEN  --  1.1  --     Recent Results (from the past 240 hour(s))  Resp Panel by RT-PCR (Flu A&B, Covid) Nasopharyngeal Swab     Status: None   Collection Time: 12/15/21  1:51 PM   Specimen: Nasopharyngeal Swab; Nasopharyngeal(NP) swabs in vial transport medium  Result Value Ref Range Status   SARS Coronavirus 2 by RT PCR NEGATIVE NEGATIVE Final    Comment: (NOTE) SARS-CoV-2 target nucleic acids are NOT DETECTED.  The SARS-CoV-2 RNA is generally detectable in upper respiratory specimens  during the acute phase of infection. The lowest concentration of SARS-CoV-2 viral copies this assay can detect is 138 copies/mL. A negative result does not preclude SARS-Cov-2 infection and should not be used as the sole basis for treatment or other patient management decisions. A negative result may occur with  improper specimen collection/handling, submission of specimen other than nasopharyngeal swab, presence of viral mutation(s) within the areas targeted by this assay, and inadequate number of viral copies(<138 copies/mL). A negative result must be combined with clinical observations, patient history, and epidemiological information. The expected result is Negative.  Fact Sheet for Patients:  EntrepreneurPulse.com.au  Fact Sheet for Healthcare Providers:  IncredibleEmployment.be  This test is no t yet approved or cleared by the [place] FDA and  has been authorized for detection and/or diagnosis of SARS-CoV-2 by FDA under an Emergency Use Authorization (EUA). This EUA will remain  in effect (meaning this test can be used) for the duration of the COVID-19 declaration under Section 564(b)(1) of the Act, 21 U.S.C.section 360bbb-3(b)(1), unless the authorization is terminated  or revoked sooner.       Influenza A by PCR NEGATIVE NEGATIVE Final   Influenza B by PCR NEGATIVE NEGATIVE Final    Comment: (NOTE) The Xpert Xpress SARS-CoV-2/FLU/RSV plus assay is intended as an aid in the diagnosis of influenza from Nasopharyngeal swab specimens and should not be used as a sole basis for treatment. Nasal washings and aspirates are unacceptable for Xpert Xpress SARS-CoV-2/FLU/RSV testing.  Fact Sheet for Patients: EntrepreneurPulse.com.au  Fact Sheet for Healthcare Providers: IncredibleEmployment.be  This test is not yet approved or cleared by the [place] FDA and has been authorized for detection  and/or diagnosis of SARS-CoV-2 by FDA under an Emergency Use Authorization (EUA). This EUA will remain in effect (meaning this test can be used) for the duration of the COVID-19 declaration under Section 564(b)(1) of the Act, 21 U.S.C. section 360bbb-3(b)(1), unless the authorization is terminated or revoked.  Performed at [facility], [place] [address] [place], [place] [address]   MRSA Next Gen by PCR, Nasal     Status: None   Collection Time: 12/15/21  5:44 PM  Specimen: Nasal Mucosa; Nasal Swab  Result Value Ref Range Status   MRSA by PCR Next Gen NOT DETECTED NOT DETECTED Final    Comment: (NOTE) The GeneXpert MRSA Assay (FDA approved for NASAL specimens only), is one component of a comprehensive MRSA colonization surveillance program. It is not intended to diagnose MRSA infection nor to guide or monitor treatment for MRSA infections. Test performance is not FDA approved in patients less than 18 years old. Performed at [facility], [place] [address], [place], [place] [address]   Respiratory (~20 pathogens) panel by PCR     Status: None   Collection Time: 12/16/21 12:09 PM   Specimen: Nasopharyngeal Swab; Respiratory  Result Value Ref Range Status   Adenovirus NOT DETECTED NOT DETECTED Final   Coronavirus 229E NOT DETECTED NOT DETECTED Final    Comment: (NOTE) The Coronavirus on the Respiratory Panel, DOES NOT test for the novel  Coronavirus (2019 nCoV)    Coronavirus HKU1 NOT DETECTED NOT DETECTED Final   Coronavirus NL63 NOT DETECTED NOT DETECTED Final   Coronavirus OC43 NOT DETECTED NOT DETECTED Final   Metapneumovirus NOT DETECTED NOT DETECTED Final   Rhinovirus / Enterovirus NOT DETECTED NOT DETECTED Final   Influenza A NOT DETECTED NOT DETECTED Final   Influenza B NOT DETECTED NOT DETECTED Final   Parainfluenza Virus 1 NOT DETECTED NOT DETECTED Final   Parainfluenza Virus 2 NOT DETECTED NOT DETECTED Final   Parainfluenza Virus 3  NOT DETECTED NOT DETECTED Final   Parainfluenza Virus 4 NOT DETECTED NOT DETECTED Final   Respiratory Syncytial Virus NOT DETECTED NOT DETECTED Final   Bordetella pertussis NOT DETECTED NOT DETECTED Final   Bordetella Parapertussis NOT DETECTED NOT DETECTED Final   Chlamydophila pneumoniae NOT DETECTED NOT DETECTED Final   Mycoplasma pneumoniae NOT DETECTED NOT DETECTED Final    Comment: Performed at [facility], [place], [place], [place] [address]      Radiology Studies: DG CHEST PORT 1 VIEW  Result Date: 12/21/2021 CLINICAL DATA:  Tachypnea, COPD EXAM: PORTABLE CHEST 1 VIEW COMPARISON:  12/19/2021 chest radiograph. FINDINGS: Clips overlie the lower chest bilaterally. Stable cardiomediastinal silhouette with top-normal heart size. No pneumothorax. Small bilateral pleural effusions, stable. No overt pulmonary edema. Left lower lobe atelectasis, unchanged. IMPRESSION: Stable small bilateral pleural effusions and left lower lobe atelectasis. Electronically Signed   By: Ilona Sorrel M.D.   On: 12/21/2021 09:32   ECHOCARDIOGRAM COMPLETE  Result Date: 12/21/2021    ECHOCARDIOGRAM REPORT   Patient Name:   LUCILE HILLMANN Date of Exam: 12/21/2021 Medical Rec #:  [id number]         Height:       60.0 in Accession #:    [id number]        Weight:       113.8 lb Date of Birth:  01-22-1931        BSA:          1.469 m Patient Age:    88 years          BP:           100/41 mmHg Patient Gender: F                 HR:           78 bpm. Exam Location:  Inpatient Procedure: 2D Echo Indications:    Acute Respiratory Distress  History:        Patient has prior history of Echocardiogram  examinations, most                 recent 09/26/2019. COPD; Signs/Symptoms:Shortness of Breath.  Sonographer:    Arlyss Gandy Referring Phys: [place]  1. Left ventricular ejection fraction, by estimation, is 60 to 65%. The left ventricle has normal function. The left ventricle has no regional  wall motion abnormalities. Left ventricular diastolic parameters are consistent with Grade I diastolic dysfunction (impaired relaxation).  2. Right ventricular systolic function is normal. The right ventricular size is dilated. There is mildly elevated pulmonary artery systolic pressure. The estimated right ventricular systolic pressure is 10.3 mmHg.  3. The mitral valve was not well visualized. No evidence of mitral valve regurgitation. No evidence of mitral stenosis.  4. The aortic valve was not well visualized. Aortic valve regurgitation is not visualized. No aortic stenosis is present.  5. The inferior vena cava is normal in size with <50% respiratory variability, suggesting right atrial pressure of 8 mmHg. Comparison(s): A prior study was performed on 09/26/2019. RVSP increased from prior. FINDINGS  Left Ventricle: Left ventricular ejection fraction, by estimation, is 60 to 65%. The left ventricle has normal function. The left ventricle has no regional wall motion abnormalities. The left ventricular internal cavity size was small. There is no left ventricular hypertrophy. Left ventricular diastolic parameters are consistent with Grade I diastolic dysfunction (impaired relaxation). Right Ventricle: The right ventricular size is moderately enlarged. No increase in right ventricular wall thickness. Right ventricular systolic function is normal. There is mildly elevated pulmonary artery systolic pressure. The tricuspid regurgitant velocity is 2.73 m/s, and with an assumed right atrial pressure of 15 mmHg, the estimated right ventricular systolic pressure is 15.9 mmHg. Left Atrium: Left atrial size was normal in size. Right Atrium: Right atrial size was normal in size. Pericardium: There is no evidence of pericardial effusion. Mitral Valve: The mitral valve was not well visualized. No evidence of mitral valve regurgitation. No evidence of mitral valve stenosis. Tricuspid Valve: The tricuspid valve is normal in  structure. Tricuspid valve regurgitation is trivial. No evidence of tricuspid stenosis. Aortic Valve: The aortic valve was not well visualized. Aortic valve regurgitation is not visualized. No aortic stenosis is present. Aortic valve mean gradient measures 4.5 mmHg. Aortic valve peak gradient measures 8.3 mmHg. Aortic valve area, by VTI measures 2.65 cm. Pulmonic Valve: The pulmonic valve was not well visualized. Pulmonic valve regurgitation is not visualized. No evidence of pulmonic stenosis. Aorta: The aortic root and ascending aorta are structurally normal, with no evidence of dilitation. Venous: The inferior vena cava is normal in size with less than 50% respiratory variability, suggesting right atrial pressure of 8 mmHg. IAS/Shunts: No atrial level shunt detected by color flow Doppler.  LEFT VENTRICLE PLAX 2D LVIDd:         3.30 cm   Diastology LVIDs:         2.20 cm   LV e' medial:    7.72 cm/s LV PW:         0.70 cm   LV E/e' medial:  12.4 LV IVS:        0.70 cm   LV e' lateral:   10.10 cm/s LVOT diam:     1.90 cm   LV E/e' lateral: 9.5 LV SV:         68 LV SV Index:   47 LVOT Area:     2.84 cm  RIGHT VENTRICLE  IVC RV S prime:     12.80 cm/s  IVC diam: 2.20 cm TAPSE (M-mode): 2.3 cm LEFT ATRIUM             Index        RIGHT ATRIUM           Index LA diam:        2.50 cm 1.70 cm/m   RA Area:     11.80 cm LA Vol (A2C):   22.3 ml 15.18 ml/m  RA Volume:   26.00 ml  17.70 ml/m LA Vol (A4C):   27.4 ml 18.66 ml/m LA Biplane Vol: 25.1 ml 17.09 ml/m  AORTIC VALVE AV Area (Vmax):    2.61 cm AV Area (Vmean):   2.61 cm AV Area (VTI):     2.65 cm AV Vmax:           144.00 cm/s AV Vmean:          98.500 cm/s AV VTI:            0.258 m AV Peak Grad:      8.3 mmHg AV Mean Grad:      4.5 mmHg LVOT Vmax:         132.50 cm/s LVOT Vmean:        90.800 cm/s LVOT VTI:          0.242 m LVOT/AV VTI ratio: 0.93  AORTA Ao Root diam: 2.30 cm Ao Asc diam:  2.10 cm MITRAL VALVE               TRICUSPID VALVE MV  Area (PHT): 4.63 cm    TR Peak grad:   29.8 mmHg MV Decel Time: 164 msec    TR Vmax:        273.00 cm/s MV E velocity: 95.50 cm/s MV A velocity: 99.40 cm/s  SHUNTS MV E/A ratio:  0.96        Systemic VTI:  0.24 m                            Systemic Diam: 1.90 cm Rudean Haskell MD Electronically signed by Rudean Haskell MD Signature Date/Time: 12/21/2021/2:43:51 PM    Final     Scheduled Meds:  amLODipine  2.5 mg Oral Daily   arformoterol  15 mcg Nebulization BID   budesonide (PULMICORT) nebulizer solution  0.5 mg Nebulization Q12H   chlorhexidine  15 mL Mouth Rinse BID   Chlorhexidine Gluconate Cloth  6 each Topical Daily   enoxaparin (LOVENOX) injection  40 mg Subcutaneous Q24H   guaiFENesin  600 mg Oral BID   levothyroxine  50 mcg Oral QAC breakfast   mouth rinse  15 mL Mouth Rinse q12n4p   methylPREDNISolone (SOLU-MEDROL) injection  60 mg Intravenous Q8H   pantoprazole  40 mg Oral Daily   revefenacin  175 mcg Nebulization Daily   sodium chloride  1 g Oral BID WC   Continuous Infusions:  sodium chloride Stopped (12/21/21 1706)     LOS: 7 days   Marylu Lund, MD [facility] Pager On Amion  If 7PM-7AM, please contact night-coverage 12/22/2021, 3:21 PM

## 2021-12-23 LAB — COMPREHENSIVE METABOLIC PANEL
ALT: 19 U/L (ref 0–44)
AST: 15 U/L (ref 15–41)
Albumin: 3.4 g/dL — ABNORMAL LOW (ref 3.5–5.0)
Alkaline Phosphatase: 50 U/L (ref 38–126)
Anion gap: 9 (ref 5–15)
BUN: 22 mg/dL (ref 8–23)
CO2: 40 mmol/L — ABNORMAL HIGH (ref 22–32)
Calcium: 8.9 mg/dL (ref 8.9–10.3)
Chloride: 86 mmol/L — ABNORMAL LOW (ref 98–111)
Creatinine, Ser: 0.51 mg/dL (ref 0.44–1.00)
GFR, Estimated: >60 mL/min (ref >60)
Glucose, Bld: 121 mg/dL — ABNORMAL HIGH (ref 70–99)
Potassium: 4.6 mmol/L (ref 3.5–5.1)
Sodium: 135 mmol/L (ref 135–145)
Total Bilirubin: 1.1 mg/dL (ref 0.3–1.2)
Total Protein: 5.6 g/dL — ABNORMAL LOW (ref 6.5–8.1)

## 2021-12-23 LAB — LEGIONELLA PNEUMOPHILA SEROGP 1 UR AG: L. pneumophila Serogp 1 Ur Ag: NEGATIVE

## 2021-12-23 LAB — CBC
HCT: 47.9 % — ABNORMAL HIGH (ref 36.0–46.0)
Hemoglobin: 14.8 g/dL (ref 12.0–15.0)
MCH: 29.4 pg (ref 26.0–34.0)
MCHC: 30.9 g/dL (ref 30.0–36.0)
MCV: 95.2 fL (ref 80.0–100.0)
Platelets: 219 10*3/uL (ref 150–400)
RBC: 5.03 MIL/uL (ref 3.87–5.11)
RDW: 13.2 % (ref 11.5–15.5)
WBC: 6.4 10*3/uL (ref 4.0–10.5)
nRBC: 0 % (ref 0.0–0.2)

## 2021-12-23 LAB — PHOSPHORUS: Phosphorus: 3 mg/dL (ref 2.5–4.6)

## 2021-12-23 LAB — MAGNESIUM: Magnesium: 2.3 mg/dL (ref 1.7–2.4)

## 2021-12-23 MED ORDER — ALPRAZOLAM 0.25 MG PO TABS
0.2500 mg | ORAL_TABLET | Freq: Every evening | ORAL | Status: DC | PRN
  Administered 2021-12-23 – 2021-12-26 (×4): 0.25 mg via ORAL
  Filled 2021-12-23 (×5): qty 1

## 2021-12-23 MED ORDER — METHYLPREDNISOLONE SODIUM SUCC 125 MG IJ SOLR
60.0000 mg | Freq: Two times a day (BID) | INTRAMUSCULAR | Status: DC
  Administered 2021-12-24 – 2021-12-25 (×3): 60 mg via INTRAVENOUS
  Filled 2021-12-23 (×3): qty 2

## 2021-12-23 NOTE — NC FL2 (Signed)
[place] MEDICAID FL2 LEVEL OF CARE SCREENING TOOL     IDENTIFICATION  Patient Name: Victoria Rice Birthdate: Jun 07, 1931 Sex: female Admission Date (Current Location): 12/15/2021  [facility] and [place] Number:  Herbalist and Address:  [facility],  [place], [place]      Provider Number: [id number]  Attending Physician Name and Address:  Donne Hazel, MD  Relative Name and Phone Number:       Current Level of Care: Hospital Recommended Level of Care: [place] Prior Approval Number:    Date Approved/Denied:   PASRR Number: [id number]  Discharge Plan: SNF    Current Diagnoses: Patient Active Problem List   Diagnosis Date Noted   Acute bronchitis with COPD ([place]) 12/15/2021   Malignant neoplasm of unspecified part of unspecified bronchus or lung ([place]) 12/15/2021   Acute hypovolemic hyponatremia 12/15/2021   Acute respiratory failure with hypercapnia ([place]) 12/15/2021   Aortic atherosclerosis ([place]) 10/31/2020   Emphysema of lung ([place]) 10/31/2020   Sebaceous cyst of labia 08/18/2018   Neuroendocrine carcinoma of lung ([place]) 10/26/2016   COPD, severe ([place]) 07/31/2016   Breast cancer of upper-outer quadrant of left female breast ([place]) 05/22/2016    Orientation RESPIRATION BLADDER Height & Weight     Self, Time, Situation, Place  Normal Continent Weight: 51.9 kg Height:  5' (152.4 cm)  BEHAVIORAL SYMPTOMS/MOOD NEUROLOGICAL BOWEL NUTRITION STATUS      Continent Diet (regular)  AMBULATORY STATUS COMMUNICATION OF NEEDS Skin   Extensive Assist Verbally Normal                       Personal Care Assistance Level of Assistance  Bathing, Feeding, Dressing Bathing Assistance: Limited assistance Feeding assistance: Limited assistance Dressing Assistance: Limited assistance     Functional Limitations Info  Sight, Hearing, Speech Sight Info: Adequate Hearing Info: Adequate Speech Info: Adequate     SPECIAL CARE FACTORS FREQUENCY  PT (By licensed PT), OT (By licensed OT)     PT Frequency: 5 xweekly OT Frequency: 5 x weekly            Contractures Contractures Info: Present    Additional Factors Info  Code Status Code Status Info: full             Current Medications (12/23/2021):  This is the current hospital active medication list Current Facility-Administered Medications  Medication Dose Route Frequency Provider Last Rate Last Admin   0.9 %  sodium chloride infusion   Intravenous PRN Brand Males, MD   Stopped at 12/21/21 1706   albuterol (PROVENTIL) (2.5 MG/3ML) 0.083% nebulizer solution 2.5 mg  2.5 mg Nebulization Q4H PRN Chesley Mires, MD   2.5 mg at 12/18/21 2332   amLODipine (NORVASC) tablet 2.5 mg  2.5 mg Oral Daily Nita Sells, MD   2.5 mg at 12/23/21 0811   arformoterol (BROVANA) nebulizer solution 15 mcg  15 mcg Nebulization BID Tanda Rockers, MD   15 mcg at 12/23/21 0833   budesonide (PULMICORT) nebulizer solution 0.5 mg  0.5 mg Nebulization Q12H Chesley Mires, MD   0.5 mg at 12/23/21 0833   chlorhexidine (PERIDEX) 0.12 % solution 15 mL  15 mL Mouth Rinse BID Nita Sells, MD   15 mL at 12/23/21 0810   Chlorhexidine Gluconate Cloth 2 % PADS 6 each  6 each Topical Daily Nita Sells, MD   6 each at 12/23/21 0813   enoxaparin (LOVENOX) injection 40 mg  40 mg Subcutaneous Q24H Leone Haven T, RPH   40 mg at 12/22/21 2234   guaiFENesin (MUCINEX) 12 hr tablet 600 mg  600 mg Oral BID Chesley Mires, MD   600 mg at 12/23/21 0811   guaiFENesin-dextromethorphan (ROBITUSSIN DM) 100-10 MG/5ML syrup 5 mL  5 mL Oral Q4H PRN Nita Sells, MD   5 mL at 12/18/21 2331   hydrALAZINE (APRESOLINE) injection 10 mg  10 mg Intravenous Q4H PRN Donne Hazel, MD   10 mg at 12/22/21 2106   levothyroxine (SYNTHROID) tablet 50 mcg  50 mcg Oral QAC breakfast Nita Sells, MD   50 mcg at 12/23/21 0510   lip balm (CARMEX) ointment    Topical PRN Donne Hazel, MD   1 application at 28/31/51 2101   MEDLINE mouth rinse  15 mL Mouth Rinse q12n4p Nita Sells, MD   15 mL at 12/22/21 1637   methylPREDNISolone sodium succinate (SOLU-MEDROL) 125 mg/2 mL injection 60 mg  60 mg Intravenous Q8H Brand Males, MD   60 mg at 12/23/21 0510   metoprolol tartrate (LOPRESSOR) injection 5 mg  5 mg Intravenous Q6H PRN Nita Sells, MD   5 mg at 12/22/21 1744   ondansetron (ZOFRAN) injection 4 mg  4 mg Intravenous Q6H PRN Donne Hazel, MD   4 mg at 12/20/21 0625   pantoprazole (PROTONIX) EC tablet 40 mg  40 mg Oral Daily Donne Hazel, MD   40 mg at 12/23/21 7616   prochlorperazine (COMPAZINE) injection 5 mg  5 mg Intravenous Q6H PRN Donne Hazel, MD   5 mg at 12/20/21 2049   revefenacin (YUPELRI) nebulizer solution 175 mcg  175 mcg Nebulization Daily Chesley Mires, MD   175 mcg at 12/22/21 0801   sodium chloride tablet 1 g  1 g Oral BID WC Nita Sells, MD   1 g at 12/23/21 0810   traZODone (DESYREL) tablet 50 mg  50 mg Oral QHS Donne Hazel, MD   50 mg at 12/22/21 2121     Discharge Medications: Please see discharge summary for a list of discharge medications.  Relevant Imaging Results:  Relevant Lab Results:   Additional Information WVP:[id number]  Leeroy Cha, RN

## 2021-12-23 NOTE — Plan of Care (Signed)
°  Problem: Education: °Goal: Knowledge of disease or condition will improve °Outcome: Progressing °Goal: Knowledge of the prescribed therapeutic regimen will improve °Outcome: Progressing °Goal: Individualized Educational Video(s) °Outcome: Progressing °  °

## 2021-12-23 NOTE — Progress Notes (Signed)
Occupational Therapy Treatment Patient Details Name: Victoria Rice MRN: [id number] DOB: 1931-03-21 Today's Date: 12/23/2021   History of present illness 88 yo female admitted with acute resp failure 2* bronchiectasis, COPD exac. Hx of severe COPD   OT comments  Patient was noted to maintain O2 saturation in 90s on 5L HFNC. Patient was able to engage in bathing, dressing, and grooming tasks sitting EOB with increased time for tasks. Patient is motivated to be able to transition to rehab soon. At end of session when returned to bed patient was noted to drop to 88% with HOB positioned up higher and deep breathing education patient returned to 96% on 5L/min. Patient would continue to benefit from skilled OT services at this time while admitted and after d/c to address noted deficits in order to improve overall safety and independence in ADLs.     Recommendations for follow up therapy are one component of a multi-disciplinary discharge planning process, led by the attending physician.  Recommendations may be updated based on patient status, additional functional criteria and insurance authorization.    Follow Up Recommendations  Skilled nursing-short term rehab (<3 hours/day)    Assistance Recommended at Discharge Frequent or constant Supervision/Assistance  Patient can return home with the following  A lot of help with walking and/or transfers;A lot of help with bathing/dressing/bathroom   Equipment Recommendations  None recommended by OT    Recommendations for Other Services      Precautions / Restrictions Precautions Precautions: Fall Precaution Comments: monitor vitals, on off BiPAP Restrictions Weight Bearing Restrictions: No       Mobility Bed Mobility Overal bed mobility: Needs Assistance Bed Mobility: Supine to Sit;Sit to Supine     Supine to sit: Min assist Sit to supine: Min assist   General bed mobility comments: patient needed increased time for all tasks with  cues for proper hand and foot placement    Transfers Overall transfer level: Needs assistance   Transfers: Sit to/from Stand Sit to Stand: Min assist           General transfer comment: holding onto back of a chair, stood x 2, small side steps x 3     Balance Overall balance assessment: Needs assistance Sitting-balance support: Feet supported;Bilateral upper extremity supported Sitting balance-Leahy Scale: Fair     Standing balance support: Bilateral upper extremity supported;Reliant on assistive device for balance;During functional activity Standing balance-Leahy Scale: Poor Standing balance comment: decreased endurance                           ADL either performed or assessed with clinical judgement   ADL Overall ADL's : Needs assistance/impaired     Grooming: Wash/dry face;Set up;Sitting;Oral care Grooming Details (indicate cue type and reason): EOB Upper Body Bathing: Set up;Sitting Upper Body Bathing Details (indicate cue type and reason): EOB.     Upper Body Dressing : Set up;Sitting Upper Body Dressing Details (indicate cue type and reason): EOB                   General ADL Comments: patient was able to engage in ADLs with 5L HFNC on this date and maintain O2.    Extremity/Trunk Assessment              Vision       Perception     Praxis      Cognition Arousal/Alertness: Awake/alert Behavior During Therapy: WFL for tasks assessed/performed Overall Cognitive Status:  Within Functional Limits for tasks assessed                                 General Comments: patient alert and oriented today.          Exercises General Exercises - Lower Extremity Long Arc Quad: AROM;Both;5 reps;Seated Hip Flexion/Marching: AROM;Both;5 reps;Seated   Shoulder Instructions       General Comments      Pertinent Vitals/ Pain       Pain Assessment: No/denies pain Faces Pain Scale: No hurt  Home Living                                           Prior Functioning/Environment              Frequency  Min 2X/week        Progress Toward Goals  OT Goals(current goals can now be found in the care plan section)  Progress towards OT goals: Progressing toward goals     Plan Discharge plan remains appropriate    Co-evaluation                 AM-PAC OT "6 Clicks" Daily Activity     Outcome Measure   Help from another person eating meals?: A Little Help from another person taking care of personal grooming?: A Little Help from another person toileting, which includes using toliet, bedpan, or urinal?: Total Help from another person bathing (including washing, rinsing, drying)?: A Lot Help from another person to put on and taking off regular upper body clothing?: A Lot Help from another person to put on and taking off regular lower body clothing?: Total 6 Click Score: 12    End of Session Equipment Utilized During Treatment: Oxygen  OT Visit Diagnosis: Unsteadiness on feet (R26.81);Other abnormalities of gait and mobility (R26.89);Muscle weakness (generalized) (M62.81)   Activity Tolerance Patient tolerated treatment well   Patient Left in bed;with call bell/phone within reach;with bed alarm set   Nurse Communication Mobility status        Time: 7001-7494 OT Time Calculation (min): 34 min  Charges: OT General Charges $OT Visit: 1 Visit OT Treatments $Self Care/Home Management : 23-37 mins  Jackelyn Poling OTR/L, MS Acute Rehabilitation Department Office# [phone] Pager# [phone]   Marcellina Millin 12/23/2021, 3:45 PM

## 2021-12-23 NOTE — TOC Progression Note (Addendum)
Transition of Care [facility]) - Progression Note    Patient Details  Name: Victoria Rice MRN: [id number] Date of Birth: May 10, 1931  [facility] CM/SW Contact  Leeroy Cha, RN Phone Number: 12/23/2021, 9:51 AM  Clinical Narrative:    Pt is now off bipap on [place] 5l hfnc fl2 sent out [facility] and [facility] have made bed offers pt and son request [facility] at this time. [facility] started. Additional clinicals sent Expected Discharge Plan: [place] Barriers to Discharge: No Barriers Identified  Expected Discharge Plan and Services Expected Discharge Plan: [place] arrangements for the past 2 months: Single Family Home                                       Social Determinants of Health (SDOH) Interventions    Readmission Risk Interventions No flowsheet data found.

## 2021-12-23 NOTE — Progress Notes (Signed)
Physical Therapy Treatment Patient Details Name: Victoria Rice MRN: [id number] DOB: May 18, 1931 Today's Date: 12/23/2021   History of Present Illness 88 yo female admitted with acute resp failure 2* bronchiectasis, COPD exac. Hx of severe COPD    PT Comments    The patient is awake and oriented, able to follow directions and participate in mobility.  Patient  mobilized to sitting, stood from bed x 2 x ~ 2 minutes each. Patient on 5 L [place], SPO2 >94 % throughout and HR 94-110 with activity.  Continue PT for increased activity as  tolerated.   Recommendations for follow up therapy are one component of a multi-disciplinary discharge planning process, led by the attending physician.  Recommendations may be updated based on patient status, additional functional criteria and insurance authorization.  Follow Up Recommendations  Skilled nursing-short term rehab (<3 hours/day)     Assistance Recommended at Discharge    Patient can return home with the following A little help with walking and/or transfers;A little help with bathing/dressing/bathroom;Assistance with cooking/housework;Direct supervision/assist for medications management;Help with stairs or ramp for entrance   Equipment Recommendations  None recommended by PT    Recommendations for Other Services       Precautions / Restrictions Precautions Precautions: Fall Precaution Comments: monitor vitals, on off BiPAP Restrictions Weight Bearing Restrictions: No     Mobility  Bed Mobility   Bed Mobility: Supine to Sit;Sit to Supine     Supine to sit: Min assist Sit to supine: Mod assist   General bed mobility comments: extra time, multimodal cues  ,assist with trunk and legs.    Transfers Overall transfer level: Needs assistance   Transfers: Sit to/from Stand Sit to Stand: Min assist           General transfer comment: holding onto back of a chair, stood x 2, small side steps x 3    Ambulation/Gait                    Stairs             Wheelchair Mobility    Modified Rankin (Stroke Patients Only)       Balance Overall balance assessment: Needs assistance Sitting-balance support: Feet supported;Bilateral upper extremity supported Sitting balance-Leahy Scale: Fair     Standing balance support: Bilateral upper extremity supported;Reliant on assistive device for balance;During functional activity Standing balance-Leahy Scale: Poor Standing balance comment: decreased endurance                            Cognition Arousal/Alertness: Awake/alert Behavior During Therapy: WFL for tasks assessed/performed Overall Cognitive Status: Within Functional Limits for tasks assessed                                 General Comments: patient alert and oriented today.        Exercises General Exercises - Lower Extremity Long Arc Quad: AROM;Both;5 reps;Seated Hip Flexion/Marching: AROM;Both;5 reps;Seated    General Comments        Pertinent Vitals/Pain Pain Assessment: No/denies pain Faces Pain Scale: No hurt    Home Living                          Prior Function            PT Goals (current goals can now be found in the  care plan section) Progress towards PT goals: Progressing toward goals    Frequency    Min 2X/week      PT Plan Current plan remains appropriate    Co-evaluation              AM-PAC PT "6 Clicks" Mobility   Outcome Measure  Help needed turning from your back to your side while in a flat bed without using bedrails?: A Lot Help needed moving from lying on your back to sitting on the side of a flat bed without using bedrails?: A Lot Help needed moving to and from a bed to a chair (including a wheelchair)?: A Lot Help needed standing up from a chair using your arms (e.g., wheelchair or bedside chair)?: A Lot Help needed to walk in hospital room?: Total Help needed climbing 3-5 steps with a railing? :  Total 6 Click Score: 10    End of Session Equipment Utilized During Treatment: Oxygen Activity Tolerance: Patient tolerated treatment well Patient left: in bed;with call bell/phone within reach;with family/visitor present;with bed alarm set Nurse Communication: Mobility status PT Visit Diagnosis: Muscle weakness (generalized) (M62.81);Difficulty in walking, not elsewhere classified (R26.2)     Time: 3612-2449 PT Time Calculation (min) (ACUTE ONLY): 25 min  Charges:  $Therapeutic Exercise: 8-22 mins $Therapeutic Activity: 8-22 mins                     Victoria Rice PT Acute Rehabilitation Services Pager [phone] Office [phone]    Victoria Rice 12/23/2021, 3:43 PM

## 2021-12-23 NOTE — Progress Notes (Signed)
Patient remained between 90 and 99% O2 on 5L O2 throughout the day. Patient dropped to 88% after working with OT today but remained over 94% when standing with PT.   Patient O2 saturation is currently 97% on 5 L. Will continue to monitor.  Layla Maw, RN

## 2021-12-23 NOTE — Progress Notes (Signed)
PROGRESS NOTE    Victoria Rice  YTK:[id number] DOB: [birth year] DOA: 12/15/2021 PCP: Faustino Congress, NP    Brief Narrative:  88 year old independent living dwelling white female Left upper outer breast cancer status postlumpectomy 2017 previously on anastrozole Diagnosed with neuroendocrine tumor of lung/pulmonary carcinoid Prior aberrant right subclavian artery Underlying PVCs, mild HFpEF based on echocardiogram 09/26/2019 EF 60 to 65% Severe prior COPD  with FEV1 of 0.4 followed by Dr. Sudie Grumbling   Seen several weeks ago in oncology office was short of breath declined work-up-had recently been placed on Norvasc for blood pressure but this was complicated by lower extremity edema and hence was placed on Lasix-they discontinued Norvasc recently   Report from family/patient-increasing shortness of breath no other new medications no anxiolytics pain meds recently She was so weak that they brought her to emergency room there she was found to have PCO2 of 80 sodium 121 BUN/creatinine 9/0.6 BNP of 60 COVID was negative CXR no abnormality--   Assessment & Plan:   Principal Problem:   Acute bronchitis with COPD ([place]) Active Problems:   Breast cancer of upper-outer quadrant of left female breast ([place])   COPD, severe ([place])   Neuroendocrine carcinoma of lung ([place])   Malignant neoplasm of unspecified part of unspecified bronchus or lung ([place])   Acute hypovolemic hyponatremia   Acute respiratory failure with hypercapnia ([place])  Acute type II respiratory failure with bronchitis/COPD exacerbation with hypercarbia Earlier had required BiPAP in the emergency room Respiratory viral panel negative On 1/12, pt appeared more lethargic with ABG demonstrating a pCO2 of over 100 Repeat ABG reviewed with pH remains 7.2 with pCO2 93 Appreciate input by PCCM. Now on nightly bipap, will need ABG one day before discharge to eval for hypercapnea. Per PCCM, will need BiPAP for home Now tapering  steroids  Severe COPD FEV0.4 in 2017 Discontinued cefepime 1/9 ordered on admission  Completed course of cefdinir Continue albuterol every 6 as needed, Brovana 15 mcg twice daily, Pulmicort twice daily--pulmonary recommends Trelegy 1 puff daily on discharge to simplify things instead of all the other inhalers Recommend outpatient spirometry FEV1 etc. Per above, Pulmonary recs nightly bipap, steroids now being weaned  Mild chest pain No overt concerns for DVT noted HTN Continued amlodipine 2.5, can use as needed metoprolol 5 mg every 6 as needed for pressure >170 Remains stable thus far Acute hypovolemic hyponatremia Salt tabs were added recently  Serum osm 251 with urine osm around 350 with urine Na of 24  Sodium up to 135 today Mild metabolic alkalosis ruled out. Respiratory acidosis secondary to hypercarbic respiratory failure ruled in  Pt is continued on nightly bipap with steroids now being tapered slowly   Prior breast cancer, neuroendocrine tumor of the lung Outpatient follow-up with pulmonary/oncology as needed  DVT prophylaxis: Lovenox subq Code Status: Full Family Communication: Pt in room, family not at bedside  Status is: Inpatient  Remains inpatient appropriate because: Severity of illness   Consultants:  PCCM  Procedures:    Antimicrobials: Anti-infectives (From admission, onward)    Start     Dose/Rate Route Frequency Ordered Stop   12/16/21 2200  cefdinir (OMNICEF) capsule 300 mg        300 mg Oral Every 12 hours 12/16/21 1628 12/19/21 2359   12/15/21 2000  ceFEPIme (MAXIPIME) 2 g in sodium chloride 0.9 % 100 mL IVPB  Status:  Discontinued        2 g 200 mL/hr over 30 Minutes Intravenous Every 12  hours 12/15/21 1852 12/16/21 1628   12/15/21 1615  ceFEPIme (MAXIPIME) 2 g in sodium chloride 0.9 % 100 mL IVPB  Status:  Discontinued        2 g 200 mL/hr over 30 Minutes Intravenous Every 8 hours 12/15/21 1606 12/15/21 1852       Subjective:  States  feeling much better today  Objective: Vitals:   12/23/21 0530 12/23/21 0702 12/23/21 0837 12/23/21 1028  BP:  (!) 156/71  138/71  Pulse:  100  97  Resp:  20  16  Temp:  98.6 F (37 C)  98 F (36.7 C)  TempSrc:  Oral  Oral  SpO2: 93% 94% 95% 98%  Weight:      Height:        Intake/Output Summary (Last 24 hours) at 12/23/2021 1517 Last data filed at 12/23/2021 0530 Gross per 24 hour  Intake 240 ml  Output 925 ml  Net -685 ml    Filed Weights   12/20/21 0500 12/21/21 0500 12/23/21 0500  Weight: 53.8 kg 51.6 kg 51.9 kg    Examination: General exam: Awake, laying in bed, in nad Respiratory system: slightly increased respiratory effort, no wheezing Cardiovascular system: regular rate, s1, s2 Gastrointestinal system: Soft, nondistended, positive BS Central nervous system: CN2-12 grossly intact, strength intact Extremities: Perfused, no clubbing Skin: Normal skin turgor, no notable skin lesions seen Psychiatry: Mood normal // no visual hallucinations   Data Reviewed: I have personally reviewed following labs and imaging studies  CBC: Recent Labs  Lab 12/17/21 0244 12/19/21 0240 12/21/21 0239 12/22/21 0230 12/23/21 0339  WBC 11.7* 10.2 7.2 5.9 6.4  NEUTROABS 9.0*  --   --   --   --   HGB 13.9 13.8 14.1 14.4 14.8  HCT 44.7 46.1* 47.3* 47.2* 47.9*  MCV 95.7 98.7 98.3 97.1 95.2  PLT 296 271 232 207 774    Basic Metabolic Panel: Recent Labs  Lab 12/20/21 0805 12/21/21 0239 12/21/21 1232 12/21/21 1335 12/22/21 0230 12/23/21 0339  NA 128* 130*  --  130* 131* 135  K 5.3* 5.3*  --  5.1 4.8 4.6  CL 82* 83*  --  82* 82* 86*  CO2 38* 38*  --  41* 39* 40*  GLUCOSE 91 97  --  100* 142* 121*  BUN 18 20  --  20 21 22   CREATININE 0.39* 0.41*  --  0.43* 0.46 0.51  CALCIUM 8.7* 8.8*  --  8.9 8.6* 8.9  MG  --   --  2.3  --  2.0 2.3  PHOS  --   --   --  2.1* 4.6 3.0    GFR: Estimated Creatinine Clearance: 34.9 mL/min (by C-G formula based on SCr of 0.51 mg/dL). Liver  Function Tests: Recent Labs  Lab 12/19/21 0240 12/20/21 0805 12/21/21 0239 12/22/21 0230 12/23/21 0339  AST 14* 25 18 15 15   ALT 19 24 21 20 19   ALKPHOS 56 62 58 53 50  BILITOT 0.6 0.9 1.1 0.7 1.1  PROT 5.7* 5.8* 5.8* 5.5* 5.6*  ALBUMIN 3.3* 3.4* 3.4* 3.2* 3.4*    No results for input(s): LIPASE, AMYLASE in the last 168 hours. No results for input(s): AMMONIA in the last 168 hours. Coagulation Profile: No results for input(s): INR, PROTIME in the last 168 hours.  Cardiac Enzymes: Recent Labs  Lab 12/21/21 1335  CKTOTAL 23*  CKMB 1.7    BNP (last 3 results) No results for input(s): PROBNP in the last 8760 hours.  HbA1C: No results for input(s): HGBA1C in the last 72 hours. CBG: No results for input(s): GLUCAP in the last 168 hours. Lipid Profile: No results for input(s): CHOL, HDL, LDLCALC, TRIG, CHOLHDL, LDLDIRECT in the last 72 hours. Thyroid Function Tests: No results for input(s): TSH, T4TOTAL, FREET4, T3FREE, THYROIDAB in the last 72 hours.  Anemia Panel: No results for input(s): VITAMINB12, FOLATE, FERRITIN, TIBC, IRON, RETICCTPCT in the last 72 hours. Sepsis Labs: Recent Labs  Lab 12/21/21 1232 12/21/21 1335 12/22/21 0230  PROCALCITON <0.10  --  <0.10  LATICACIDVEN  --  1.1  --      Recent Results (from the past 240 hour(s))  Resp Panel by RT-PCR (Flu A&B, Covid) Nasopharyngeal Swab     Status: None   Collection Time: 12/15/21  1:51 PM   Specimen: Nasopharyngeal Swab; Nasopharyngeal(NP) swabs in vial transport medium  Result Value Ref Range Status   SARS Coronavirus 2 by RT PCR NEGATIVE NEGATIVE Final    Comment: (NOTE) SARS-CoV-2 target nucleic acids are NOT DETECTED.  The SARS-CoV-2 RNA is generally detectable in upper respiratory specimens during the acute phase of infection. The lowest concentration of SARS-CoV-2 viral copies this assay can detect is 138 copies/mL. A negative result does not preclude SARS-Cov-2 infection and should not be  used as the sole basis for treatment or other patient management decisions. A negative result may occur with  improper specimen collection/handling, submission of specimen other than nasopharyngeal swab, presence of viral mutation(s) within the areas targeted by this assay, and inadequate number of viral copies(<138 copies/mL). A negative result must be combined with clinical observations, patient history, and epidemiological information. The expected result is Negative.  Fact Sheet for Patients:  EntrepreneurPulse.com.au  Fact Sheet for Healthcare Providers:  IncredibleEmployment.be  This test is no t yet approved or cleared by the [place] FDA and  has been authorized for detection and/or diagnosis of SARS-CoV-2 by FDA under an Emergency Use Authorization (EUA). This EUA will remain  in effect (meaning this test can be used) for the duration of the COVID-19 declaration under Section 564(b)(1) of the Act, 21 U.S.C.section 360bbb-3(b)(1), unless the authorization is terminated  or revoked sooner.       Influenza A by PCR NEGATIVE NEGATIVE Final   Influenza B by PCR NEGATIVE NEGATIVE Final    Comment: (NOTE) The Xpert Xpress SARS-CoV-2/FLU/RSV plus assay is intended as an aid in the diagnosis of influenza from Nasopharyngeal swab specimens and should not be used as a sole basis for treatment. Nasal washings and aspirates are unacceptable for Xpert Xpress SARS-CoV-2/FLU/RSV testing.  Fact Sheet for Patients: EntrepreneurPulse.com.au  Fact Sheet for Healthcare Providers: IncredibleEmployment.be  This test is not yet approved or cleared by the [place] FDA and has been authorized for detection and/or diagnosis of SARS-CoV-2 by FDA under an Emergency Use Authorization (EUA). This EUA will remain in effect (meaning this test can be used) for the duration of the COVID-19 declaration under Section  564(b)(1) of the Act, 21 U.S.C. section 360bbb-3(b)(1), unless the authorization is terminated or revoked.  Performed at [facility], [place] [address], [place], [place] [address]   MRSA Next Gen by PCR, Nasal     Status: None   Collection Time: 12/15/21  5:44 PM   Specimen: Nasal Mucosa; Nasal Swab  Result Value Ref Range Status   MRSA by PCR Next Gen NOT DETECTED NOT DETECTED Final    Comment: (NOTE) The GeneXpert MRSA Assay (FDA approved for NASAL specimens  only), is one component of a comprehensive MRSA colonization surveillance program. It is not intended to diagnose MRSA infection nor to guide or monitor treatment for MRSA infections. Test performance is not FDA approved in patients less than 16 years old. Performed at [facility], [place] [address], [place], [place] [address]   Respiratory (~20 pathogens) panel by PCR     Status: None   Collection Time: 12/16/21 12:09 PM   Specimen: Nasopharyngeal Swab; Respiratory  Result Value Ref Range Status   Adenovirus NOT DETECTED NOT DETECTED Final   Coronavirus 229E NOT DETECTED NOT DETECTED Final    Comment: (NOTE) The Coronavirus on the Respiratory Panel, DOES NOT test for the novel  Coronavirus (2019 nCoV)    Coronavirus HKU1 NOT DETECTED NOT DETECTED Final   Coronavirus NL63 NOT DETECTED NOT DETECTED Final   Coronavirus OC43 NOT DETECTED NOT DETECTED Final   Metapneumovirus NOT DETECTED NOT DETECTED Final   Rhinovirus / Enterovirus NOT DETECTED NOT DETECTED Final   Influenza A NOT DETECTED NOT DETECTED Final   Influenza B NOT DETECTED NOT DETECTED Final   Parainfluenza Virus 1 NOT DETECTED NOT DETECTED Final   Parainfluenza Virus 2 NOT DETECTED NOT DETECTED Final   Parainfluenza Virus 3 NOT DETECTED NOT DETECTED Final   Parainfluenza Virus 4 NOT DETECTED NOT DETECTED Final   Respiratory Syncytial Virus NOT DETECTED NOT DETECTED Final   Bordetella pertussis NOT DETECTED NOT DETECTED Final    Bordetella Parapertussis NOT DETECTED NOT DETECTED Final   Chlamydophila pneumoniae NOT DETECTED NOT DETECTED Final   Mycoplasma pneumoniae NOT DETECTED NOT DETECTED Final    Comment: Performed at [facility], [place]. [address] [place], [place] [address]      Radiology Studies: No results found.  Scheduled Meds:  amLODipine  2.5 mg Oral Daily   arformoterol  15 mcg Nebulization BID   budesonide (PULMICORT) nebulizer solution  0.5 mg Nebulization Q12H   chlorhexidine  15 mL Mouth Rinse BID   Chlorhexidine Gluconate Cloth  6 each Topical Daily   enoxaparin (LOVENOX) injection  40 mg Subcutaneous Q24H   guaiFENesin  600 mg Oral BID   levothyroxine  50 mcg Oral QAC breakfast   mouth rinse  15 mL Mouth Rinse q12n4p   methylPREDNISolone (SOLU-MEDROL) injection  60 mg Intravenous Q8H   pantoprazole  40 mg Oral Daily   revefenacin  175 mcg Nebulization Daily   sodium chloride  1 g Oral BID WC   traZODone  50 mg Oral QHS   Continuous Infusions:  sodium chloride Stopped (12/21/21 1706)     LOS: 8 days   Marylu Lund, MD [facility] Pager On Amion  If 7PM-7AM, please contact night-coverage 12/23/2021, 3:17 PM

## 2021-12-24 DIAGNOSIS — J9622 Acute and chronic respiratory failure with hypercapnia: Secondary | ICD-10-CM

## 2021-12-24 DIAGNOSIS — J90 Pleural effusion, not elsewhere classified: Secondary | ICD-10-CM

## 2021-12-24 DIAGNOSIS — J439 Emphysema, unspecified: Secondary | ICD-10-CM

## 2021-12-24 DIAGNOSIS — J810 Acute pulmonary edema: Secondary | ICD-10-CM

## 2021-12-24 LAB — CBC
HCT: 44.7 % (ref 36.0–46.0)
Hemoglobin: 13.3 g/dL (ref 12.0–15.0)
MCH: 29.1 pg (ref 26.0–34.0)
MCHC: 29.8 g/dL — ABNORMAL LOW (ref 30.0–36.0)
MCV: 97.8 fL (ref 80.0–100.0)
Platelets: 156 10*3/uL (ref 150–400)
RBC: 4.57 MIL/uL (ref 3.87–5.11)
RDW: 13.1 % (ref 11.5–15.5)
WBC: 9.2 10*3/uL (ref 4.0–10.5)
nRBC: 0 % (ref 0.0–0.2)

## 2021-12-24 LAB — COMPREHENSIVE METABOLIC PANEL
ALT: 18 U/L (ref 0–44)
AST: 12 U/L — ABNORMAL LOW (ref 15–41)
Albumin: 2.8 g/dL — ABNORMAL LOW (ref 3.5–5.0)
Alkaline Phosphatase: 41 U/L (ref 38–126)
Anion gap: 3 — ABNORMAL LOW (ref 5–15)
BUN: 26 mg/dL — ABNORMAL HIGH (ref 8–23)
CO2: 43 mmol/L — ABNORMAL HIGH (ref 22–32)
Calcium: 8.5 mg/dL — ABNORMAL LOW (ref 8.9–10.3)
Chloride: 88 mmol/L — ABNORMAL LOW (ref 98–111)
Creatinine, Ser: 0.46 mg/dL (ref 0.44–1.00)
GFR, Estimated: >60 mL/min (ref >60)
Glucose, Bld: 122 mg/dL — ABNORMAL HIGH (ref 70–99)
Potassium: 4.8 mmol/L (ref 3.5–5.1)
Sodium: 134 mmol/L — ABNORMAL LOW (ref 135–145)
Total Bilirubin: 0.8 mg/dL (ref 0.3–1.2)
Total Protein: 4.8 g/dL — ABNORMAL LOW (ref 6.5–8.1)

## 2021-12-24 MED ORDER — FUROSEMIDE 10 MG/ML IJ SOLN
40.0000 mg | Freq: Once | INTRAMUSCULAR | Status: AC
  Administered 2021-12-25: 40 mg via INTRAVENOUS
  Filled 2021-12-24: qty 4

## 2021-12-24 MED ORDER — FUROSEMIDE 10 MG/ML IJ SOLN
40.0000 mg | Freq: Once | INTRAMUSCULAR | Status: AC
  Administered 2021-12-24: 40 mg via INTRAVENOUS
  Filled 2021-12-24: qty 4

## 2021-12-24 NOTE — Care Management Important Message (Signed)
Important Message  Patient Details IM Letter placed in Patients room. Name: Victoria Rice MRN: [id number] Date of Birth: 10-13-1931   Medicare Important Message Given:  Yes     Kerin Salen 12/24/2021, 11:16 AM

## 2021-12-24 NOTE — Progress Notes (Signed)
PROGRESS NOTE    Victoria Rice  IWL:[id number] DOB: [birth year] DOA: 12/15/2021 PCP: Faustino Congress, NP    Brief Narrative:  88 year old independent living dwelling white female Left upper outer breast cancer status postlumpectomy 2017 previously on anastrozole Diagnosed with neuroendocrine tumor of lung/pulmonary carcinoid Prior aberrant right subclavian artery Underlying PVCs, mild HFpEF based on echocardiogram 09/26/2019 EF 60 to 65% Severe prior COPD  with FEV1 of 0.4 followed by Dr. Sudie Grumbling   Seen several weeks ago in oncology office was short of breath declined work-up-had recently been placed on Norvasc for blood pressure but this was complicated by lower extremity edema and hence was placed on Lasix-they discontinued Norvasc recently   Report from family/patient-increasing shortness of breath no other new medications no anxiolytics pain meds recently She was so weak that they brought her to emergency room there she was found to have PCO2 of 80 sodium 121 BUN/creatinine 9/0.6 BNP of 66 COVID was negative CXR no abnormality--   Assessment & Plan:   Principal Problem:   Acute bronchitis with COPD ([place]) Active Problems:   Breast cancer of upper-outer quadrant of left female breast ([place])   COPD, severe ([place])   Neuroendocrine carcinoma of lung ([place])   Malignant neoplasm of unspecified part of unspecified bronchus or lung ([place])   Acute hypovolemic hyponatremia   Acute respiratory failure with hypercapnia ([place])  Acute type II respiratory failure with bronchitis/COPD exacerbation with hypercarbia Had been requiring intermittent bipap this visit Respiratory viral panel negative Pt was weaned off bipap per PCCM, however on 1/12, pt appeared more lethargic with ABG demonstrating a pCO2 of over 100 Repeat ABG reviewed with pH remains 7.2 with pCO2 93 Per PCCM, now on nightly bipap, will need ABG one day before discharge to eval for hypercapnea.  Per PCCM, will need  BiPAP for home On 1/17, pt noted to be more encephalopathic, requiring continued bipap Slow steroid taper  Severe COPD FEV0.4 in 2017 Discontinued cefepime 1/9 ordered on admission  Completed course of cefdinir Continue albuterol every 6 as needed, Brovana 15 mcg twice daily, Pulmicort twice daily--pulmonary recommends Trelegy 1 puff daily on discharge to simplify things instead of all the other inhalers Recommend outpatient spirometry FEV1 etc. Per above, Pulmonary recs nightly bipap, steroids now being weaned Per above, continuing bipap given increased lethargy this AM  Mild chest pain No overt concerns for DVT noted HTN Continued amlodipine 2.5, can use as needed metoprolol 5 mg every 6 as needed for pressure >170 BP remains stable and controlled  Acute hypovolemic hyponatremia Salt tabs were added recently  Serum osm 251 with urine osm around 350 with urine Na of 24  Sodium improved, 134 today  Repeat bmet in AM  Mild metabolic alkalosis ruled out. Respiratory acidosis secondary to hypercarbic respiratory failure ruled in  Pt is continued on nightly bipap with steroids now being tapered slowly   Prior breast cancer, neuroendocrine tumor of the lung Outpatient follow-up with pulmonary/oncology as needed  End of Life -Code status confirmed with son at bedside. Pt's wishes are confirmed to be DNR/DNI. Orders placed -Given continued dependence on bipap and slow recovery, have consulted Palliative Care to establish goals of care   DVT prophylaxis: Lovenox subq Code Status: Full Family Communication: Pt in room, family not at bedside  Status is: Inpatient  Remains inpatient appropriate because: Severity of illness   Consultants:  PCCM Palliative Care  Procedures:    Antimicrobials: Anti-infectives (From admission, onward)    Start  Dose/Rate Route Frequency Ordered Stop   12/16/21 2200  cefdinir (OMNICEF) capsule 300 mg        300 mg Oral Every 12 hours  12/16/21 1628 12/19/21 2359   12/15/21 2000  ceFEPIme (MAXIPIME) 2 g in sodium chloride 0.9 % 100 mL IVPB  Status:  Discontinued        2 g 200 mL/hr over 30 Minutes Intravenous Every 12 hours 12/15/21 1852 12/16/21 1628   12/15/21 1615  ceFEPIme (MAXIPIME) 2 g in sodium chloride 0.9 % 100 mL IVPB  Status:  Discontinued        2 g 200 mL/hr over 30 Minutes Intravenous Every 8 hours 12/15/21 1606 12/15/21 1852       Subjective:  Unable to assess given mentation  Objective: Vitals:   12/24/21 0331 12/24/21 0646 12/24/21 0736 12/24/21 1059  BP:  127/68    Pulse: 82 85  83  Resp: 20 20  20   Temp:  98.1 F (36.7 C)    TempSrc:  Oral    SpO2: 96% 96% 97% 94%  Weight:  50 kg    Height:        Intake/Output Summary (Last 24 hours) at 12/24/2021 1528 Last data filed at 12/24/2021 0800 Gross per 24 hour  Intake 240 ml  Output 0 ml  Net 240 ml    Filed Weights   12/21/21 0500 12/23/21 0500 12/24/21 0646  Weight: 51.6 kg 51.9 kg 50 kg    Examination: General exam: Lethargic, not conversant, in no acute distress Respiratory system: normal chest rise, clear, decreased BS Cardiovascular system: regular rhythm, s1-s2 Gastrointestinal system: Nondistended, nontender, pos BS Central nervous system: No seizures, no tremors Extremities: No cyanosis, no joint deformities Skin: No rashes, no pallor Psychiatry: Unable to assess given mentation   Data Reviewed: I have personally reviewed following labs and imaging studies  CBC: Recent Labs  Lab 12/19/21 0240 12/21/21 0239 12/22/21 0230 12/23/21 0339 12/24/21 0347  WBC 10.2 7.2 5.9 6.4 9.2  HGB 13.8 14.1 14.4 14.8 13.3  HCT 46.1* 47.3* 47.2* 47.9* 44.7  MCV 98.7 98.3 97.1 95.2 97.8  PLT 271 232 207 219 546    Basic Metabolic Panel: Recent Labs  Lab 12/21/21 0239 12/21/21 1232 12/21/21 1335 12/22/21 0230 12/23/21 0339 12/24/21 0347  NA 130*  --  130* 131* 135 134*  K 5.3*  --  5.1 4.8 4.6 4.8  CL 83*  --  82* 82*  86* 88*  CO2 38*  --  41* 39* 40* 43*  GLUCOSE 97  --  100* 142* 121* 122*  BUN 20  --  20 21 22  26*  CREATININE 0.41*  --  0.43* 0.46 0.51 0.46  CALCIUM 8.8*  --  8.9 8.6* 8.9 8.5*  MG  --  2.3  --  2.0 2.3  --   PHOS  --   --  2.1* 4.6 3.0  --     GFR: Estimated Creatinine Clearance: 34.9 mL/min (by C-G formula based on SCr of 0.46 mg/dL). Liver Function Tests: Recent Labs  Lab 12/20/21 0805 12/21/21 0239 12/22/21 0230 12/23/21 0339 12/24/21 0347  AST 25 18 15 15  12*  ALT 24 21 20 19 18   ALKPHOS 62 58 53 50 41  BILITOT 0.9 1.1 0.7 1.1 0.8  PROT 5.8* 5.8* 5.5* 5.6* 4.8*  ALBUMIN 3.4* 3.4* 3.2* 3.4* 2.8*    No results for input(s): LIPASE, AMYLASE in the last 168 hours. No results for input(s): AMMONIA in the  last 168 hours. Coagulation Profile: No results for input(s): INR, PROTIME in the last 168 hours.  Cardiac Enzymes: Recent Labs  Lab 12/21/21 1335  CKTOTAL 23*  CKMB 1.7    BNP (last 3 results) No results for input(s): PROBNP in the last 8760 hours. HbA1C: No results for input(s): HGBA1C in the last 72 hours. CBG: No results for input(s): GLUCAP in the last 168 hours. Lipid Profile: No results for input(s): CHOL, HDL, LDLCALC, TRIG, CHOLHDL, LDLDIRECT in the last 72 hours. Thyroid Function Tests: No results for input(s): TSH, T4TOTAL, FREET4, T3FREE, THYROIDAB in the last 72 hours.  Anemia Panel: No results for input(s): VITAMINB12, FOLATE, FERRITIN, TIBC, IRON, RETICCTPCT in the last 72 hours. Sepsis Labs: Recent Labs  Lab 12/21/21 1232 12/21/21 1335 12/22/21 0230  PROCALCITON <0.10  --  <0.10  LATICACIDVEN  --  1.1  --      Recent Results (from the past 240 hour(s))  Resp Panel by RT-PCR (Flu A&B, Covid) Nasopharyngeal Swab     Status: None   Collection Time: 12/15/21  1:51 PM   Specimen: Nasopharyngeal Swab; Nasopharyngeal(NP) swabs in vial transport medium  Result Value Ref Range Status   SARS Coronavirus 2 by RT PCR NEGATIVE NEGATIVE  Final    Comment: (NOTE) SARS-CoV-2 target nucleic acids are NOT DETECTED.  The SARS-CoV-2 RNA is generally detectable in upper respiratory specimens during the acute phase of infection. The lowest concentration of SARS-CoV-2 viral copies this assay can detect is 138 copies/mL. A negative result does not preclude SARS-Cov-2 infection and should not be used as the sole basis for treatment or other patient management decisions. A negative result may occur with  improper specimen collection/handling, submission of specimen other than nasopharyngeal swab, presence of viral mutation(s) within the areas targeted by this assay, and inadequate number of viral copies(<138 copies/mL). A negative result must be combined with clinical observations, patient history, and epidemiological information. The expected result is Negative.  Fact Sheet for Patients:  EntrepreneurPulse.com.au  Fact Sheet for Healthcare Providers:  IncredibleEmployment.be  This test is no t yet approved or cleared by the [place] FDA and  has been authorized for detection and/or diagnosis of SARS-CoV-2 by FDA under an Emergency Use Authorization (EUA). This EUA will remain  in effect (meaning this test can be used) for the duration of the COVID-19 declaration under Section 564(b)(1) of the Act, 21 U.S.C.section 360bbb-3(b)(1), unless the authorization is terminated  or revoked sooner.       Influenza A by PCR NEGATIVE NEGATIVE Final   Influenza B by PCR NEGATIVE NEGATIVE Final    Comment: (NOTE) The Xpert Xpress SARS-CoV-2/FLU/RSV plus assay is intended as an aid in the diagnosis of influenza from Nasopharyngeal swab specimens and should not be used as a sole basis for treatment. Nasal washings and aspirates are unacceptable for Xpert Xpress SARS-CoV-2/FLU/RSV testing.  Fact Sheet for Patients: EntrepreneurPulse.com.au  Fact Sheet for Healthcare  Providers: IncredibleEmployment.be  This test is not yet approved or cleared by the [place] FDA and has been authorized for detection and/or diagnosis of SARS-CoV-2 by FDA under an Emergency Use Authorization (EUA). This EUA will remain in effect (meaning this test can be used) for the duration of the COVID-19 declaration under Section 564(b)(1) of the Act, 21 U.S.C. section 360bbb-3(b)(1), unless the authorization is terminated or revoked.  Performed at [facility], [place] [address], [place], [place] [address]   MRSA Next Gen by PCR, Nasal     Status:  None   Collection Time: 12/15/21  5:44 PM   Specimen: Nasal Mucosa; Nasal Swab  Result Value Ref Range Status   MRSA by PCR Next Gen NOT DETECTED NOT DETECTED Final    Comment: (NOTE) The GeneXpert MRSA Assay (FDA approved for NASAL specimens only), is one component of a comprehensive MRSA colonization surveillance program. It is not intended to diagnose MRSA infection nor to guide or monitor treatment for MRSA infections. Test performance is not FDA approved in patients less than 63 years old. Performed at [facility], [place] [address] [place], [place] [address]   Respiratory (~20 pathogens) panel by PCR     Status: None   Collection Time: 12/16/21 12:09 PM   Specimen: Nasopharyngeal Swab; Respiratory  Result Value Ref Range Status   Adenovirus NOT DETECTED NOT DETECTED Final   Coronavirus 229E NOT DETECTED NOT DETECTED Final    Comment: (NOTE) The Coronavirus on the Respiratory Panel, DOES NOT test for the novel  Coronavirus (2019 nCoV)    Coronavirus HKU1 NOT DETECTED NOT DETECTED Final   Coronavirus NL63 NOT DETECTED NOT DETECTED Final   Coronavirus OC43 NOT DETECTED NOT DETECTED Final   Metapneumovirus NOT DETECTED NOT DETECTED Final   Rhinovirus / Enterovirus NOT DETECTED NOT DETECTED Final   Influenza A NOT DETECTED NOT DETECTED Final   Influenza B NOT  DETECTED NOT DETECTED Final   Parainfluenza Virus 1 NOT DETECTED NOT DETECTED Final   Parainfluenza Virus 2 NOT DETECTED NOT DETECTED Final   Parainfluenza Virus 3 NOT DETECTED NOT DETECTED Final   Parainfluenza Virus 4 NOT DETECTED NOT DETECTED Final   Respiratory Syncytial Virus NOT DETECTED NOT DETECTED Final   Bordetella pertussis NOT DETECTED NOT DETECTED Final   Bordetella Parapertussis NOT DETECTED NOT DETECTED Final   Chlamydophila pneumoniae NOT DETECTED NOT DETECTED Final   Mycoplasma pneumoniae NOT DETECTED NOT DETECTED Final    Comment: Performed at [facility], [place]. [address], [place], [place] [address]      Radiology Studies: No results found.  Scheduled Meds:  amLODipine  2.5 mg Oral Daily   arformoterol  15 mcg Nebulization BID   budesonide (PULMICORT) nebulizer solution  0.5 mg Nebulization Q12H   chlorhexidine  15 mL Mouth Rinse BID   Chlorhexidine Gluconate Cloth  6 each Topical Daily   enoxaparin (LOVENOX) injection  40 mg Subcutaneous Q24H   guaiFENesin  600 mg Oral BID   levothyroxine  50 mcg Oral QAC breakfast   mouth rinse  15 mL Mouth Rinse q12n4p   methylPREDNISolone (SOLU-MEDROL) injection  60 mg Intravenous Q12H   pantoprazole  40 mg Oral Daily   revefenacin  175 mcg Nebulization Daily   sodium chloride  1 g Oral BID WC   traZODone  50 mg Oral QHS   Continuous Infusions:  sodium chloride Stopped (12/21/21 1706)     LOS: 9 days   Marylu Lund, MD [facility] Pager On Amion  If 7PM-7AM, please contact night-coverage 12/24/2021, 3:28 PM

## 2021-12-24 NOTE — TOC Progression Note (Signed)
Transition of Care [facility]) - Progression Note    Patient Details  Name: KAFI DOTTER MRN: [id number] Date of Birth: [birth year]  Transition of Care [facility]) CM/SW Contact  Leeroy Cha, RN Phone Number: 12/24/2021, 9:12 AM  Clinical Narrative:    Message sent to Deirdre Pippins at [facility] that [facility] health approved/will let me know of bed availability after her meeting.   Expected Discharge Plan: [place] Barriers to Discharge: No Barriers Identified  Expected Discharge Plan and Services Expected Discharge Plan: [place] arrangements for the past 2 months: Single Family Home                                       Social Determinants of Health (SDOH) Interventions    Readmission Risk Interventions No flowsheet data found.

## 2021-12-24 NOTE — Progress Notes (Signed)
RT NOTE:  Pt taken off BiPAP and placed on 5L [place] tolerating well with saturations of 95%.

## 2021-12-24 NOTE — Progress Notes (Signed)
NAME:  Victoria Rice, MRN:  [id number], DOB:  18-Mar-1931, LOS: 9 ADMISSION DATE:  12/15/2021, CONSULTATION DATE:  1/8 REFERRING MD:  Cresenciano Lick, CHIEF COMPLAINT:  sob    BRIEF  37 yowf remote smoker with GOLD 3 copd dx 07/18/16 with reversibility and nl dlco but declined f/u by Dr Carlis Abbott and now admitted by [facility] with dx of aecopd and PCCM asked to eval pm 1/8 p declining referral 11/06/21 to return to pulmonary office for new dx of hypoxemic resp failure and breathing worse since ? Nov 2022 but esp x 10 d PTA      Seen several weeks PTA in oncology office was short of breath declined work-up-had recently been placed on Norvasc for blood pressure but this was complicated by lower extremity edema and hence was placed on Lasix-they discontinued Norvasc recently     She was wheezing apparently according to EMS given albuterol 04/07/2024 Solu-Medrol placed on 3 L nasal cannula   Pertinent  Medical History  Breast cancer, Hypothyroidism, Lt lower lobe carcinoid tumor Breast ca no chemo or RT LLL carcinoid on navigational bx 08/2016 followed by [facility] conservatively    Latest Reference Range & Units 12/19/21 10:43 12/21/21 09:59  pH, Arterial 7.350 - 7.450  7.218 (L) 7.298 (L)  pCO2 arterial 32.0 - 48.0 mmHg 106 (HH) 93.4 (HH)  pO2, Arterial 83.0 - 108.0 mmHg 169 (H) 54.8 (L)  (HH): Data is critically high (L): Data is abnormally low (H): Data is abnormally high  PFT Results Latest Ref Rng & Units 07/18/2016  FVC-Pre L 0.96  FVC-Predicted Pre % 50  FVC-Post L 1.24  FVC-Predicted Post % 65  Pre FEV1/FVC % % 49  Post FEV1/FCV % % 48  FEV1-Pre L 0.47  FEV1-Predicted Pre % 33  FEV1-Post L 0.60  DLCO uncorrected ml/min/mmHg 14.13  DLCO [facility]% % 80  DLVA Predicted % 167  TLC L 5.06  TLC % Predicted % 117  RV % Predicted % 170     Studies:  PFT 07/18/16 >> FEV1 0.6 (42%), FEV1% 48, TLC 5.06 (117%), DLCO 80%, +BD Echo 09/26/19 >> EF 60 to 65% CT chest 09/10/21 >> smooth margin ovoid  nodule LLL 1.9 x 1.0 cm, multiple smaller nodules stable, scattered cylindrical and varicose BTX mostly in RML and lingula  SUBJECTIVE/OVERNIGHT/INTERVAL HX  1/8 - Admit on Bipap with severe hypoxemic and hypercarbic Resp failure . CCM consult - Rx as AECOPD but most likely this ia AE-bronchiectasis.  According to the nurse walking in the ICU. 1/11 - Had trouble sleeping last night and had trouble controlling her bladder last night.  Breathing okay.  Not having cough and doesn't feel wheezy. CCM signed off  Interim History / Subjective:  She has had trouble tolerating BiPAP at night, but did better last night than 2 nights ago.  She still feels like she is not mentally back to her baseline.  Objective   Blood pressure 127/68, pulse 83, temperature 98.1 F (36.7 C), temperature source Oral, resp. rate 20, height 5' (1.524 m), weight 50 kg, SpO2 94 %.        Intake/Output Summary (Last 24 hours) at 12/24/2021 1902 Last data filed at 12/24/2021 1851 Gross per 24 hour  Intake 240 ml  Output 350 ml  Net -110 ml    Filed Weights   12/21/21 0500 12/23/21 0500 12/24/21 0646  Weight: 51.6 kg 51.9 kg 50 kg     General Appearance: Chronically ill, frail appearing woman sitting up in  bed no acute distress HEENT: [place]/AT, eyes anicteric, oral mucosa moist Neck: Supple Lungs: Bilateral crackles, no wheezing.  No conversational dyspnea.  Remains on 5 L nasal cannula. Heart: S1-S2, regular rate and rhythm Abdomen: Soft, nontender, nondistended Extremities: No significant peripheral edema, minimal muscle mass Skin: Warm, dry, no rashes.  Thin skin. Neurologic: Awake and alert, answering questions appropriately.  Normal speech.  Globally weak, but moving all extremities.  Echo-LVEF 60 to 65%, no regional wall motion abnormalities.  Grade 1 diastolic dysfunction.  RV normal RV systolic function.  Mildly dilated   Assessment & Plan:   Acute hypoxic and hypercapnic respiratory failure from COPD  and bronchiectasis exacerbation. Likely superimposed acute pulmonary edema contributing.  I suspect she has chronic hypercapnia that has developed over time from severe obstructive lung disease (FEV1 42% of predicted in 2017).  She has not managed on bronchodilators as an outpatient.  She likely has a component of acute pulmonary edema with bilateral pleural effusions that are contributing. -Continue nocturnal BiPAP - Supplemental oxygen as required to maintain SPO2 greater than 90% - Additional Lasix tonight - Chest x-ray and BNP level in the morning - Continue bronchodilators-Brovana, Pulmicort twice daily, Yupelri daily.  Recommend discharge on Bevespi twice daily or Trelegy once daily.  I seasonal covered by her insurance, combination LABA/LAMA plus separate ICS inhaler as appropriate. -Continue Solu-Medrol twice daily.  Hopefully can start tapering this tomorrow since her wheezing is better controlled. -Anticipate she will require nocturnal noninvasive positive pressure ventilation at discharge due to chronic hypercapnia and life-threatening respiratory failure. -DNR code status is appropriate   Acute metabolic encephalopathy due to the above -Try to avoid benzodiazepines and opiates, which could contribute to worsening hypercapnia. -Avoid benzos and opioids   LLL carcinoid tumor. -previously followed by Dr. Roxan Hockey with thoracic surgery -pt opted at visit on 09/10/21 to forego any further radiographic follow up, which is reasonable since she would not be a surigcal candidate   Deconditioning --PT, OT -Agree that she is likely to need rehabilitation after hospitalization.   Son in law updated at bedside.   Best practice:     Future Appointments  Date Time Provider [place]  12/30/2021  2:00 PM Melvenia Needles, NP [facility] None     LABS    PULMONARY Recent Labs  Lab 12/19/21 1043 12/21/21 0959 12/21/21 1415  PHART 7.218* 7.298* 7.372  PCO2ART 106*  93.4* 77.5*  PO2ART 169* 54.8* 70.0*  HCO3 41.7* 44.4* 44.0*  O2SAT 99.4 86.6 94.8     CBC Recent Labs  Lab 12/22/21 0230 12/23/21 0339 12/24/21 0347  HGB 14.4 14.8 13.3  HCT 47.2* 47.9* 44.7  WBC 5.9 6.4 9.2  PLT 207 219 156     COAGULATION No results for input(s): INR in the last 168 hours.   CARDIAC  No results for input(s): TROPONINI in the last 168 hours. No results for input(s): PROBNP in the last 168 hours.   CHEMISTRY Recent Labs  Lab 12/21/21 0239 12/21/21 1232 12/21/21 1335 12/22/21 0230 12/23/21 0339 12/24/21 0347  NA 130*  --  130* 131* 135 134*  K 5.3*  --  5.1 4.8 4.6 4.8  CL 83*  --  82* 82* 86* 88*  CO2 38*  --  41* 39* 40* 43*  GLUCOSE 97  --  100* 142* 121* 122*  BUN 20  --  20 21 22  26*  CREATININE 0.41*  --  0.43* 0.46 0.51 0.46  CALCIUM 8.8*  --  8.9  8.6* 8.9 8.5*  MG  --  2.3  --  2.0 2.3  --   PHOS  --   --  2.1* 4.6 3.0  --     Estimated Creatinine Clearance: 34.9 mL/min (by C-G formula based on SCr of 0.46 mg/dL).   Julian Hy, DO 12/24/21 7:50 PM [facility]

## 2021-12-24 NOTE — Progress Notes (Signed)
RT NOTE:  Pt placed on BiPAP for a nap. Vitals are stable at this time, RT will continue to monitor.

## 2021-12-25 ENCOUNTER — Inpatient Hospital Stay (HOSPITAL_COMMUNITY): Payer: Medicare PPO

## 2021-12-25 DIAGNOSIS — J441 Chronic obstructive pulmonary disease with (acute) exacerbation: Principal | ICD-10-CM

## 2021-12-25 LAB — BLOOD GAS, VENOUS
Acid-Base Excess: 17.6 mmol/L — ABNORMAL HIGH (ref 0.0–2.0)
Bicarbonate: 50 mmol/L — ABNORMAL HIGH (ref 20.0–28.0)
O2 Saturation: 59.5 %
Patient temperature: 98.6
pCO2, Ven: 102 mmHg (ref 44.0–60.0)
pH, Ven: 7.314 (ref 7.250–7.430)
pO2, Ven: 34.2 mmHg (ref 32.0–45.0)

## 2021-12-25 LAB — BRAIN NATRIURETIC PEPTIDE: B Natriuretic Peptide: 72.5 pg/mL (ref 0.0–100.0)

## 2021-12-25 MED ORDER — METHYLPREDNISOLONE SODIUM SUCC 40 MG IJ SOLR
40.0000 mg | Freq: Two times a day (BID) | INTRAMUSCULAR | Status: DC
  Administered 2021-12-25 – 2021-12-27 (×4): 40 mg via INTRAVENOUS
  Filled 2021-12-25 (×4): qty 1

## 2021-12-25 NOTE — Progress Notes (Signed)
PROGRESS NOTE    Victoria Rice  RKY:[id number] DOB: 1931/06/03 DOA: 12/15/2021 PCP: Faustino Congress, NP    Brief Narrative:  Victoria Rice is an 88 year old female with past medical history significant for left lower lobe carcinoid tumor, GOLD 3 COPD, breast cancer s/p lumpectomy 2017, essential hypertension, who presented to [facility] ED on 1/8 with complaints of shortness of breath.  Family reports progression in shortness of breath, no new medications.  On EMS arrival, patient was wheezing and given albuterol, Solu-Medrol placed on 3 L nasal cannula.  Recently seen in oncology office outpatient, was short of breath but declined further work-up at that time.  Has been placed on Norvasc for blood pressure that caused lower extremity edema which was discontinued and subsequent placed on Lasix.  In the ED, BP 104/77, HR 92, RR 22, SPO2 99% on BiPAP.  VBG with pH 7.28, PCO2 80.8, PO2 36.5.  Sodium 121, potassium 3.5, chloride 76, CO2 34, glucose 110, BUN 9, creatinine 0.67.  High sensitive troponin 4.  BNP 52.7.  WBC 13.3, hemoglobin 16.0, platelets 312.  COVID-19 PCR negative.  Influenza A/B PCR negative.  Chest x-ray with no acute cardiopulmonary disease process.  EDP started IV fluid hydration, albuterol neb and placed on BiPAP.  Hospital service consulted for further evaluation management of acute respite failure secondary to COPD exacerbation.   Assessment & Plan:   Principal Problem:   Acute bronchitis with COPD ([place]) Active Problems:   Breast cancer of upper-outer quadrant of left female breast ([place])   COPD, severe ([place])   Neuroendocrine carcinoma of lung ([place])   Malignant neoplasm of unspecified part of unspecified bronchus or lung ([place])   Acute hypovolemic hyponatremia   Acute respiratory failure with hypercapnia (HCC)   Acute hypoxic and hypercapnic respiratory failure COPD exacerbation Bronchiectasis Patient presenting to the ED with progressive shortness of breath,  found to have elevated CO2 level on VBG.  Suspect chronic hypercapnia from severe COPD.  Completed 4-day course of antibiotics with cefepime followed by cefdinir. --Pulmonology following, appreciate assistance --Brovana neb twice daily --Pulmicort neb twice daily --Yupelri neb daily --Solu-Medrol 40 mg IV q12h --Mucinex 6 mg p.o. twice daily --Albuterol neb every 4 hours.  Wheezing/shortness of breath -- BiPAP nightly and during naps; will need at discharge due to chronic hypercapnia life-threatening respiratory failure --On discharge, pulmonology recommends Bevespi BID or Trelegy daily  Acute metabolic encephalopathy, POA Patient presenting with shortness of breath, confusion found to have an elevated PCO2 likely from underlying COPD. --Continue treatment as above --Minimize sedating meds as this contributes to hypercarbia with avoidance of benzos/opioids if able  Hyponatremia Hypochloremia Etiology likely secondary to dehydration in setting of poor oral intake.  Sodium on admission 121 with chloride 76.  Serum osmolality 251, urine osmolality 350, urine sodium 24.  Improved with IV fluid hydration.  Sodium up to 134 with chloride 88. --Sodium chloride 1 g p.o. twice daily --Continue to encourage increased oral intake  Left lower lobe carcinoid tumor Previously followed by Dr. Roxan Hockey with thoracic surgery, patient opted to forego any further follow-up on 09/10/2021 due to poor surgical candidate.  Anxiety: Xanax 0.25 mg qHS PRN  Essential hypertension --Amlodipine 2.5 mg p.o. daily --Metoprolol tartrate --Hydralazine 10 mg IV q4h PRN SBP >315  Chronic diastolic congestive heart failure TTE 12/21/2021 with LVEF 60 to 17%, grade 1 diastolic dysfunction, RVSP 37.8, IVC normal in size.  Repeat chest x-ray 12/25/2021 with persistent bibasilar effusions/atelectasis greater on left.  Received IV Lasix  yesterday, pulmonology repeating today. --Strict I's and O's and daily  weights  Hypothyroidism TSH 0.445 on 12/16/2021, within normal limits. --Levothyroxine 50 mcg p.o. daily  Insomnia: Trazodone 50 mg p.o. nightly  GERD: Protonix 40 mg p.o. daily  Weakness/deconditioning/debility: --TOC for SNF placement --Continue therapy efforts while inpatient   DVT prophylaxis: enoxaparin (LOVENOX) injection 40 mg Start: 12/16/21 2200   Code Status: DNR Family Communication: No family present at bedside this morning  Disposition Plan:  Level of care: Progressive Status is: Inpatient  Remains inpatient appropriate because: Remains on IV steroids, pulmonology tapering down, awaiting pulmonology to sign off, plan to discharge to [facility] next 2-3 days.     Consultants:  PCCM Palliative care  Procedures:  TTE  Antimicrobials:  Cefepime 1/8 - 1/9 Cefdinir 1/9 - 1/12     Subjective: Patient seen examined bedside, resting comfortably.  Continues with mild shortness of breath, but much improved since initial ED presentation.  No specific questions or concerns at this time.  States slept well last night using BiPAP.  Awaiting for SNF placement, PCCM titrating down steroids today.  Denies headache, no dizziness, no chest pain, no palpitations, no abdominal pain, no fever/chills/night sweats, no nausea/vomiting/diarrhea, no paresthesias.  No acute events overnight per nursing staff.  Objective: Vitals:   12/25/21 0839 12/25/21 1057 12/25/21 1104 12/25/21 1320  BP:    (!) 124/56  Pulse:  93 94 88  Resp:  18 (!) 25 16  Temp:    98.3 F (36.8 C)  TempSrc:    Oral  SpO2: 96% 90% 90% 93%  Weight:      Height:        Intake/Output Summary (Last 24 hours) at 12/25/2021 1651 Last data filed at 12/25/2021 1310 Gross per 24 hour  Intake 120 ml  Output 4350 ml  Net -4230 ml   Filed Weights   12/21/21 0500 12/23/21 0500 12/24/21 0646  Weight: 51.6 kg 51.9 kg 50 kg    Examination:  General exam: Appears calm and comfortable, chronically  ill/elderly appearance Respiratory system: Breath sounds slight decreased bilateral bases, mild late expiratory wheezing, no crackles, normal respiratory effort, on room air at rest with SPO2 98%. Cardiovascular system: S1 & S2 heard, RRR. No JVD, murmurs, rubs, gallops or clicks. No pedal edema. Gastrointestinal system: Abdomen is nondistended, soft and nontender. No organomegaly or masses felt. Normal bowel sounds heard. Central nervous system: Alert and oriented. No focal neurological deficits. Extremities: Symmetric 5 x 5 power. Skin: No rashes, lesions or ulcers Psychiatry: Judgement and insight appear normal. Mood & affect appropriate.     Data Reviewed: I have personally reviewed following labs and imaging studies  CBC: Recent Labs  Lab 12/19/21 0240 12/21/21 0239 12/22/21 0230 12/23/21 0339 12/24/21 0347  WBC 10.2 7.2 5.9 6.4 9.2  HGB 13.8 14.1 14.4 14.8 13.3  HCT 46.1* 47.3* 47.2* 47.9* 44.7  MCV 98.7 98.3 97.1 95.2 97.8  PLT 271 232 207 219 694   Basic Metabolic Panel: Recent Labs  Lab 12/21/21 0239 12/21/21 1232 12/21/21 1335 12/22/21 0230 12/23/21 0339 12/24/21 0347  NA 130*  --  130* 131* 135 134*  K 5.3*  --  5.1 4.8 4.6 4.8  CL 83*  --  82* 82* 86* 88*  CO2 38*  --  41* 39* 40* 43*  GLUCOSE 97  --  100* 142* 121* 122*  BUN 20  --  20 21 22  26*  CREATININE 0.41*  --  0.43* 0.46 0.51 0.46  CALCIUM 8.8*  --  8.9 8.6* 8.9 8.5*  MG  --  2.3  --  2.0 2.3  --   PHOS  --   --  2.1* 4.6 3.0  --    GFR: Estimated Creatinine Clearance: 34.9 mL/min (by C-G formula based on SCr of 0.46 mg/dL). Liver Function Tests: Recent Labs  Lab 12/20/21 0805 12/21/21 0239 12/22/21 0230 12/23/21 0339 12/24/21 0347  AST 25 18 15 15  12*  ALT 24 21 20 19 18   ALKPHOS 62 58 53 50 41  BILITOT 0.9 1.1 0.7 1.1 0.8  PROT 5.8* 5.8* 5.5* 5.6* 4.8*  ALBUMIN 3.4* 3.4* 3.2* 3.4* 2.8*   No results for input(s): LIPASE, AMYLASE in the last 168 hours. No results for input(s):  AMMONIA in the last 168 hours. Coagulation Profile: No results for input(s): INR, PROTIME in the last 168 hours. Cardiac Enzymes: Recent Labs  Lab 12/21/21 1335  CKTOTAL 23*  CKMB 1.7   BNP (last 3 results) No results for input(s): PROBNP in the last 8760 hours. HbA1C: No results for input(s): HGBA1C in the last 72 hours. CBG: No results for input(s): GLUCAP in the last 168 hours. Lipid Profile: No results for input(s): CHOL, HDL, LDLCALC, TRIG, CHOLHDL, LDLDIRECT in the last 72 hours. Thyroid Function Tests: No results for input(s): TSH, T4TOTAL, FREET4, T3FREE, THYROIDAB in the last 72 hours. Anemia Panel: No results for input(s): VITAMINB12, FOLATE, FERRITIN, TIBC, IRON, RETICCTPCT in the last 72 hours. Sepsis Labs: Recent Labs  Lab 12/21/21 1232 12/21/21 1335 12/22/21 0230  PROCALCITON <0.10  --  <0.10  LATICACIDVEN  --  1.1  --     Recent Results (from the past 240 hour(s))  MRSA Next Gen by PCR, Nasal     Status: None   Collection Time: 12/15/21  5:44 PM   Specimen: Nasal Mucosa; Nasal Swab  Result Value Ref Range Status   MRSA by PCR Next Gen NOT DETECTED NOT DETECTED Final    Comment: (NOTE) The GeneXpert MRSA Assay (FDA approved for NASAL specimens only), is one component of a comprehensive MRSA colonization surveillance program. It is not intended to diagnose MRSA infection nor to guide or monitor treatment for MRSA infections. Test performance is not FDA approved in patients less than 3 years old. Performed at [facility], [place] [address] [place], [place] [address]   Respiratory (~20 pathogens) panel by PCR     Status: None   Collection Time: 12/16/21 12:09 PM   Specimen: Nasopharyngeal Swab; Respiratory  Result Value Ref Range Status   Adenovirus NOT DETECTED NOT DETECTED Final   Coronavirus 229E NOT DETECTED NOT DETECTED Final    Comment: (NOTE) The Coronavirus on the Respiratory Panel, DOES NOT test for the novel  Coronavirus  (2019 nCoV)    Coronavirus HKU1 NOT DETECTED NOT DETECTED Final   Coronavirus NL63 NOT DETECTED NOT DETECTED Final   Coronavirus OC43 NOT DETECTED NOT DETECTED Final   Metapneumovirus NOT DETECTED NOT DETECTED Final   Rhinovirus / Enterovirus NOT DETECTED NOT DETECTED Final   Influenza A NOT DETECTED NOT DETECTED Final   Influenza B NOT DETECTED NOT DETECTED Final   Parainfluenza Virus 1 NOT DETECTED NOT DETECTED Final   Parainfluenza Virus 2 NOT DETECTED NOT DETECTED Final   Parainfluenza Virus 3 NOT DETECTED NOT DETECTED Final   Parainfluenza Virus 4 NOT DETECTED NOT DETECTED Final   Respiratory Syncytial Virus NOT DETECTED NOT DETECTED Final   Bordetella pertussis NOT DETECTED NOT DETECTED Final  Bordetella Parapertussis NOT DETECTED NOT DETECTED Final   Chlamydophila pneumoniae NOT DETECTED NOT DETECTED Final   Mycoplasma pneumoniae NOT DETECTED NOT DETECTED Final    Comment: Performed at [facility], [place] [address] [place], [place] [address]         Radiology Studies: DG CHEST PORT 1 VIEW  Result Date: 12/25/2021 CLINICAL DATA:  Pleural effusion EXAM: PORTABLE CHEST 1 VIEW COMPARISON:  Portable exam 0504 hours compared to 12/21/2021 FINDINGS: Normal heart size, mediastinal contours, and pulmonary vascularity. Atherosclerotic calcification aorta. Small bibasilar pleural effusions and basilar atelectasis slightly greater on LEFT. Upper lungs clear. No new infiltrate or pneumothorax. Bones demineralized. IMPRESSION: Persistent bibasilar effusions and atelectasis greater on LEFT. Aortic Atherosclerosis (ICD10-I70.0). Electronically Signed   By: Lavonia Dana M.D.   On: 12/25/2021 08:20        Scheduled Meds:  amLODipine  2.5 mg Oral Daily   arformoterol  15 mcg Nebulization BID   budesonide (PULMICORT) nebulizer solution  0.5 mg Nebulization Q12H   chlorhexidine  15 mL Mouth Rinse BID   enoxaparin (LOVENOX) injection  40 mg Subcutaneous Q24H   guaiFENesin  600 mg Oral  BID   levothyroxine  50 mcg Oral QAC breakfast   mouth rinse  15 mL Mouth Rinse q12n4p   methylPREDNISolone (SOLU-MEDROL) injection  40 mg Intravenous Q12H   pantoprazole  40 mg Oral Daily   revefenacin  175 mcg Nebulization Daily   sodium chloride  1 g Oral BID WC   traZODone  50 mg Oral QHS   Continuous Infusions:  sodium chloride Stopped (12/21/21 1706)     LOS: 10 days    Time spent: 46 minutes spent on chart review, discussion with nursing staff, consultants, updating family and interview/physical exam; more than 50% of that time was spent in counseling and/or coordination of care.    Marquie Aderhold J [place] DO [facility] Available via Epic secure chat 7am-7pm After these hours, please refer to coverage provider listed on [email or web address] 12/25/2021, 4:51 PM

## 2021-12-25 NOTE — Progress Notes (Signed)
°   12/25/21 0945  Provider Notification  Provider Name/Title Eric [place] DO  Date Provider Notified 12/25/21  Time Provider Notified [phone]  Notification Type Face-to-face  Notification Reason Critical result  Test performed and critical result Blood gas, venous, CO2: 102  Date Critical Result Received 12/25/21  Time Critical Result Received 0945  Provider response No new orders;Other (Comment) (Stated to put patient on Bipap when sleeping during the day)  Date of Provider Response 12/25/21  Time of Provider Response [phone]   Attending made aware. DO stated to put patient on Bipap when sleeping throughout the day today. Will continue to monitor.  Layla Maw, RN

## 2021-12-25 NOTE — Progress Notes (Signed)
°   12/25/21 1830  Urine Characteristics  Urinary Interventions Bladder scan  Bladder Scan Volume (mL) 155 mL    Has not voided since foley catheter removal today. Patient was due to void at 1900. Bladder scan at 1830 showed 155 mL. Patient does not need In/Out catheterization at this time. Will continue to monitor.  Layla Maw, RN

## 2021-12-25 NOTE — Progress Notes (Signed)
Occupational Therapy Treatment Patient Details Name: Victoria Rice MRN: [id number] DOB: [birth year] Today's Date: 12/25/2021   History of present illness 88 yo female admitted with acute resp failure 2* bronchiectasis, COPD exac. Hx of severe COPD   OT comments  Patient is pleasantly confused, becomes distracted by wants on TV needing redirection. Also needing verbal cues to sequence washing her face and brushing her teeth at the sink after attempting to wash her face without turning water on at sink. Patient also unsafe with walker needing assist to turn walker and verbal cues to back up to chair. Patient needing overall mod A with ambulation due to unsteadiness and distractibility. Acute OT to follow.   Recommendations for follow up therapy are one component of a multi-disciplinary discharge planning process, led by the attending physician.  Recommendations may be updated based on patient status, additional functional criteria and insurance authorization.    Follow Up Recommendations  Skilled nursing-short term rehab (<3 hours/day)    Assistance Recommended at Discharge Frequent or constant Supervision/Assistance  Patient can return home with the following  A lot of help with walking and/or transfers;A lot of help with bathing/dressing/bathroom   Equipment Recommendations  None recommended by OT       Precautions / Restrictions Precautions Precautions: Fall Precaution Comments: monitor vitals Restrictions Weight Bearing Restrictions: No       Mobility Bed Mobility               General bed mobility comments: in recliner    Transfers Overall transfer level: Needs assistance Equipment used: Rolling walker (2 wheels) Transfers: Sit to/from Stand Sit to Stand: Min assist, Mod assist           General transfer comment: Initially patient stood without rolling walker needing mod A to maintain posture and unable to take step. With rolling walker patient min A to  stand but mod A to ambulate     Balance Overall balance assessment: Needs assistance Sitting-balance support: Feet supported Sitting balance-Leahy Scale: Fair     Standing balance support: Single extremity supported, Reliant on assistive device for balance, During functional activity Standing balance-Leahy Scale: Poor                             ADL either performed or assessed with clinical judgement   ADL Overall ADL's : Needs assistance/impaired     Grooming: Wash/dry face;Oral care;Minimal assistance;Standing Grooming Details (indicate cue type and reason): Patient needing min A for safety with balance and moderate cues for sequencing to wash face and brush teeth                 Toilet Transfer: Moderate assistance;Ambulation;Rolling walker (2 wheels) Toilet Transfer Details (indicate cue type and reason): Ambulating to/from sink then back to recliner chair. Patient is very unsteady with walker needing max cues to sequence when to turn. Patient with posterior bias needing mod A for safety         Functional mobility during ADLs: Moderate assistance;Rolling walker (2 wheels);Cueing for safety;Cueing for sequencing        Cognition Arousal/Alertness: Awake/alert Behavior During Therapy: WFL for tasks assessed/performed Overall Cognitive Status: Impaired/Different from baseline Area of Impairment: Problem solving, Attention                   Current Attention Level: Focused         Problem Solving: Difficulty sequencing General Comments: Patient needing verbal cues  to sequence washing her face at the sink, not initiating turning on sink. Also very distractible              General Comments Patient O2 reading 85% once returned to recliner however poor wave form    Pertinent Vitals/ Pain       Pain Assessment Pain Assessment: No/denies pain         Frequency  Min 2X/week        Progress Toward Goals  OT Goals(current goals  can now be found in the care plan section)  Progress towards OT goals: Progressing toward goals  Acute Rehab OT Goals Patient Stated Goal: brush her teeth OT Goal Formulation: With patient Time For Goal Achievement: 12/31/21 Potential to Achieve Goals: Good ADL Goals Pt Will Perform Lower Body Dressing: with supervision;sit to/from stand Pt Will Transfer to Toilet: with supervision;ambulating;regular height toilet Pt Will Perform Toileting - Clothing Manipulation and hygiene: with supervision;sit to/from stand  Plan Discharge plan remains appropriate       AM-PAC OT "6 Clicks" Daily Activity     Outcome Measure   Help from another person eating meals?: A Little Help from another person taking care of personal grooming?: A Little Help from another person toileting, which includes using toliet, bedpan, or urinal?: A Lot Help from another person bathing (including washing, rinsing, drying)?: A Lot Help from another person to put on and taking off regular upper body clothing?: A Little Help from another person to put on and taking off regular lower body clothing?: A Lot 6 Click Score: 15    End of Session Equipment Utilized During Treatment: Oxygen;Rolling walker (2 wheels)  OT Visit Diagnosis: Unsteadiness on feet (R26.81);Other abnormalities of gait and mobility (R26.89);Muscle weakness (generalized) (M62.81)   Activity Tolerance Patient tolerated treatment well   Patient Left in chair;with call bell/phone within reach;with chair alarm set;with family/visitor present   Nurse Communication Mobility status        Time: 1033-1050 OT Time Calculation (min): 17 min  Charges: OT General Charges $OT Visit: 1 Visit OT Treatments $Self Care/Home Management : 8-22 mins  Delbert Phenix OT OT pager: [phone]  Rosemary Holms 12/25/2021, 12:43 PM

## 2021-12-25 NOTE — Progress Notes (Addendum)
NAME:  CERITA RABELO, MRN:  [id number], DOB:  Dec 31, 1930, LOS: 68 ADMISSION DATE:  12/15/2021, CONSULTATION DATE:  1/8 REFERRING MD:  Cresenciano Lick, CHIEF COMPLAINT:  sob    BRIEF  73 yowf remote smoker with GOLD 3 COPD dx 07/18/16 with reversibility and nl dlco but declined f/u by Dr Carlis Abbott and now admitted by [facility] with dx of aecopd and PCCM asked to eval pm 1/8 p declining referral 11/06/21 to return to pulmonary office for new dx of hypoxemic resp failure and breathing worse since ? Nov 2022 but esp x 10 d PTA     Seen several weeks PTA in oncology office was short of breath declined work-up. Recently was placed on Norvasc for blood pressure but this was complicated by lower extremity edema and hence was placed on Lasix-they discontinued Norvasc recently   She was wheezing apparently according to EMS given albuterol 04/07/2024 Solu-Medrol placed on 3 L nasal cannula   Pertinent  Medical History  Breast cancer, Hypothyroidism, Lt lower lobe carcinoid tumor Breast ca no chemo or RT LLL carcinoid on navigational bx 08/2016 followed by Roxan Hockey conservatively  Chronic Hypercarbic Respiratory Failure   Studies:  PFT 07/18/16 >> FEV1 0.6 (42%), FEV1% 48, TLC 5.06 (117%), DLCO 80%, +BD Echo 09/26/19 >> EF 60 to 65% CT chest 09/10/21 >> smooth margin ovoid nodule LLL 1.9 x 1.0 cm, multiple smaller nodules stable, scattered cylindrical and varicose BTX mostly in RML and lingula  SUBJECTIVE/OVERNIGHT/INTERVAL HX  1/8 Admit on Bipap with severe hypoxemic and hypercarbic Resp failure . CCM consult - Rx as AECOPD but most likely this ia AE-bronchiectasis.  According to the nurse walking in the ICU. 1/11 Had trouble sleeping last night and had trouble controlling her bladder last night.  Breathing okay.  Not having cough and doesn't feel wheezy. PCCM s/o.  1/14 CCM called back for worsening hypercarbia.  1/15 ABG / mental status improved with BiPAP. ECHO with grade I DD, mild cor pulmonale  1/17 Has had  trouble tolerating BIPAP at night.  Diuresed.   Interim History / Subjective:  2.5L UOP in last 24 hours  Pt reports feeling some better. States she wore the bipap last night and tolerated it  Afebrile  Eating breakfast   Objective   Blood pressure (!) 124/56, pulse 88, temperature 98.3 F (36.8 C), temperature source Oral, resp. rate 16, height 5' (1.524 m), weight 50 kg, SpO2 93 %.        Intake/Output Summary (Last 24 hours) at 12/25/2021 1353 Last data filed at 12/25/2021 1310 Gross per 24 hour  Intake 120 ml  Output 4350 ml  Net -4230 ml   Filed Weights   12/21/21 0500 12/23/21 0500 12/24/21 0646  Weight: 51.6 kg 51.9 kg 50 kg   Exam: General: elderly adult female lying in bed in NAD HEENT: MM pink/moist, [place] O2, anicteric  Neuro: AAOx4, speech clear, MAE CV: s1s2 RRR, no m/r/g PULM: non-labored at rest, bibasilar posterior crackles  GI: soft, bsx4 active  Extremities: warm/dry, no edema  Skin: no rashes or lesions   Assessment & Plan:   Acute hypoxic and hypercapnic respiratory failure from COPD and bronchiectasis exacerbation. Likely superimposed acute pulmonary edema contributing.   Suspect she has chronic hypercapnia that has developed over time from severe obstructive lung disease (FEV1 42% of predicted in 2017).  Baseline regimen is spiriva, combivent, PRN albuterol.  She likely has a component of acute pulmonary edema with bilateral pleural effusions that are contributing.  BNP  72 (from 164) -continue brovana, pulmicort, yupelri -wean O2 for sats >90% -pulmonary hygiene - IS, mobilize  -wean solumedrol to 40 mg BID -QHS BiPAP, likely will need at discharge due to chronic hypercapnia and life threatening respiratory failure -follow I/O's  -intermittent CXR  -discharge med plan: Bevespi BID or Trelegy QD.   -DNR  -will need pulmonary follow up at discharge   Acute metabolic encephalopathy due to hypercarbia, hypoxia -minimize all sedating medications as will  contribute to hypercarbia  -avoid benzo's / opioids    LLL carcinoid tumor Previously followed by Dr. Roxan Hockey with thoracic surgery.  Pt opted 09/10/2021 to forego any further follow up, poor surgical candidate    Deconditioning -PT/OT as able  -will need rehab efforts post hospital   Patient updated 1/18 on plan of care.    Best practice:  Per Primary   Future Appointments  Date Time Provider [place]  12/30/2021  2:00 PM Parrett, Fonnie Mu, NP [facility] None      Noe Gens, MSN, APRN, NP-C, AGACNP-BC [facility] 12/25/2021, 1:53 PM   Please see [email or web address] for pager details.   From 7A-7P if no response, please call [phone] After hours, please call ELink [phone]

## 2021-12-25 NOTE — Progress Notes (Signed)
Palliative care consult received and her chart was reviewed in detail. Her goals are clear in terms of a desire to return to or close to her baseline functional status which was fairly high -according to notes-she was still driving a week prior to her hospitalization. Plans seem to be oriented towards rehabilitation and stabilization of her acute exacerbation of lung disease. She has a DNR order and currently is seeking full scope medical treatment. Recommend referral for for outpatient palliative care services to follow after discharge-since she is followed in the cancer center we are happy to see her there in our palliative care clinic. Will sign off for now-if her condition deteriorates or her goals change please re-consult our inpatient service.  Lane Hacker, DO Palliative Medicine

## 2021-12-26 LAB — BASIC METABOLIC PANEL
BUN: 25 mg/dL — ABNORMAL HIGH (ref 8–23)
CO2: >45 mmol/L — ABNORMAL HIGH (ref 22–32)
Calcium: 8.6 mg/dL — ABNORMAL LOW (ref 8.9–10.3)
Chloride: 82 mmol/L — ABNORMAL LOW (ref 98–111)
Creatinine, Ser: 0.54 mg/dL (ref 0.44–1.00)
GFR, Estimated: >60 mL/min (ref >60)
Glucose, Bld: 141 mg/dL — ABNORMAL HIGH (ref 70–99)
Potassium: 3.8 mmol/L (ref 3.5–5.1)
Sodium: 135 mmol/L (ref 135–145)

## 2021-12-26 LAB — CBC
HCT: 46.6 % — ABNORMAL HIGH (ref 36.0–46.0)
Hemoglobin: 14 g/dL (ref 12.0–15.0)
MCH: 29.5 pg (ref 26.0–34.0)
MCHC: 30 g/dL (ref 30.0–36.0)
MCV: 98.1 fL (ref 80.0–100.0)
Platelets: 138 10*3/uL — ABNORMAL LOW (ref 150–400)
RBC: 4.75 MIL/uL (ref 3.87–5.11)
RDW: 12.5 % (ref 11.5–15.5)
WBC: 10.9 10*3/uL — ABNORMAL HIGH (ref 4.0–10.5)
nRBC: 0 % (ref 0.0–0.2)

## 2021-12-26 MED ORDER — FUROSEMIDE 10 MG/ML IJ SOLN
40.0000 mg | Freq: Every day | INTRAMUSCULAR | Status: AC
  Administered 2021-12-26 – 2021-12-27 (×2): 40 mg via INTRAVENOUS
  Filled 2021-12-26 (×2): qty 4

## 2021-12-26 NOTE — TOC Progression Note (Addendum)
Transition of Care [facility]) - Progression Note    Patient Details  Name: DANAMARIE MINAMI MRN: [id number] Date of Birth: 02-Nov-1931  Transition of Care [facility]) CM/SW Contact  Leeroy Cha, RN Phone Number: 12/26/2021, 11:28 AM  Clinical Narrative:    Spoke to the husband/ first accepted [facility] and is now leaing toward [facility] informed him that [facility] may not have a bed now and that the auth would have to be redone. Spoke with daughter and husband would like to see if [facility] would take will recontact and see.  Instructed that I did need a decision by tomorrow and as early as possible.  Expected Discharge Plan: [place] Barriers to Discharge: No Barriers Identified  Expected Discharge Plan and Services Expected Discharge Plan: [place] arrangements for the past 2 months: Single Family Home                                       Social Determinants of Health (SDOH) Interventions    Readmission Risk Interventions No flowsheet data found.

## 2021-12-26 NOTE — Progress Notes (Signed)
NAME:  Victoria Rice, MRN:  [id number], DOB:  1931/10/19, LOS: 74 ADMISSION DATE:  12/15/2021, CONSULTATION DATE:  1/8 REFERRING MD:  Cresenciano Lick, CHIEF COMPLAINT:  sob    BRIEF  38 yowf remote smoker with GOLD 3 COPD dx 07/18/16 with reversibility and nl dlco but declined f/u by Dr Carlis Abbott and now admitted by [facility] with dx of aecopd and PCCM asked to eval pm 1/8 p declining referral 11/06/21 to return to pulmonary office for new dx of hypoxemic resp failure and breathing worse since ? Nov 2022 but esp x 10 d PTA     Seen several weeks PTA in oncology office was short of breath declined work-up. Recently was placed on Norvasc for blood pressure but this was complicated by lower extremity edema and hence was placed on Lasix-they discontinued Norvasc recently   She was wheezing apparently according to EMS given albuterol 04/07/2024 Solu-Medrol placed on 3 L nasal cannula   Pertinent  Medical History  Breast cancer, Hypothyroidism, Lt lower lobe carcinoid tumor Breast ca no chemo or RT LLL carcinoid on navigational bx 08/2016 followed by Roxan Hockey conservatively  Chronic Hypercarbic Respiratory Failure   Studies:  PFT 07/18/16 >> FEV1 0.6 (42%), FEV1% 48, TLC 5.06 (117%), DLCO 80%, +BD Echo 09/26/19 >> EF 60 to 65% CT chest 09/10/21 >> smooth margin ovoid nodule LLL 1.9 x 1.0 cm, multiple smaller nodules stable, scattered cylindrical and varicose BTX mostly in RML and lingula  SUBJECTIVE/OVERNIGHT/INTERVAL HX  1/8 Admit on Bipap with severe hypoxemic and hypercarbic Resp failure . CCM consult - Rx as AECOPD but most likely this ia AE-bronchiectasis.  According to the nurse walking in the ICU. 1/11 Had trouble sleeping last night and had trouble controlling her bladder last night.  Breathing okay.  Not having cough and doesn't feel wheezy. PCCM s/o.  1/14 CCM called back for worsening hypercarbia.  1/15 ABG / mental status improved with BiPAP. ECHO with grade I DD, mild cor pulmonale  1/17 Has had  trouble tolerating BIPAP at night.  Diuresed.   Interim History / Subjective:  2.5L UOP in last 24 hours  Pt reports feeling some better. States she wore the bipap last night and tolerated it  Afebrile  Eating breakfast   Objective   Blood pressure (!) 117/56, pulse 80, temperature 98 F (36.7 C), temperature source Oral, resp. rate 18, height 5' (1.524 m), weight 50 kg, SpO2 94 %.        Intake/Output Summary (Last 24 hours) at 12/26/2021 1320 Last data filed at 12/26/2021 0946 Gross per 24 hour  Intake 120.6 ml  Output 440 ml  Net -319.4 ml   Filed Weights   12/21/21 0500 12/23/21 0500 12/24/21 0646  Weight: 51.6 kg 51.9 kg 50 kg   Exam: General: Chronically and acutely ill elderly F seated in recliner NAD  HEENT: [place]. [place] in place. Anicteric sclera  Neuro: AAOx3 following commands  CV: rr s1s2  PULM: Symmetrical chest expansion. Shallow respirations. Diminished basilar sounds, scattered crackles  GI: this soft ndnt  Extremities: no acute joint deformity no cyanosis  Skin: Skin breakdown across bridge of nose    Assessment & Plan:   Acute on chronic hypoxic and hypercarbic respiratory failure Severe COPD, Bronchiectasis Pulmonary edema, possible atelectasis -suspected chronic hypercarbia 2/2 severe obstructive disease  P -DNR status  -on 40mg  solumedrol BID  -continue brovana, pulmicort, yupelri. Discharge plan: Bevespri BID or Trelegy qD  -wean O2 as able, goal 88-92% -IS, flutter, mobility  -  qHS BiPAP -- likely to need this at discharge due to chronic hypercarbia  -lasix  -will need pulmonary follow up at discharge   Acute metabolic encephalopathy due to hypoxia and hypercarbia - improving  P -delirium precautions -PRN ABG  -minimize all sedating medications as will contribute to hypercarbia   LLL carcinoid tumor  Previously followed by Dr. Roxan Hockey with thoracic surgery.  Pt opted 09/10/2021 to forego any further follow up, poor surgical candidate     Deconditioning -PT/OT as able  -will need rehab efforts post hospital   Pt and pt son updated at bedside 1/19    Best practice:  Per Primary   Future Appointments  Date Time Provider [place]  12/30/2021  2:00 PM Parrett, Fonnie Mu, NP [facility] None      Eliseo Gum MSN, AGACNP-BC [place] for pager 12/26/2021, 1:20 PM

## 2021-12-26 NOTE — Progress Notes (Addendum)
PROGRESS NOTE    SACOYA MCGOURTY  QIO:[id number] DOB: 1930/12/23 DOA: 12/15/2021 PCP: Faustino Congress, NP    Brief Narrative:  Victoria Rice is an 88 year old female with past medical history significant for left lower lobe carcinoid tumor, GOLD 3 COPD, breast cancer s/p lumpectomy 2017, essential hypertension, who presented to [facility] ED on 1/8 with complaints of shortness of breath.  Family reports progression in shortness of breath, no new medications.  On EMS arrival, patient was wheezing and given albuterol, Solu-Medrol placed on 3 L nasal cannula.  Recently seen in oncology office outpatient, was short of breath but declined further work-up at that time.  Has been placed on Norvasc for blood pressure that caused lower extremity edema which was discontinued and subsequent placed on Lasix.  In the ED, BP 104/77, HR 92, RR 22, SPO2 99% on BiPAP.  VBG with pH 7.28, PCO2 80.8, PO2 36.5.  Sodium 121, potassium 3.5, chloride 76, CO2 34, glucose 110, BUN 9, creatinine 0.67.  High sensitive troponin 4.  BNP 52.7.  WBC 13.3, hemoglobin 16.0, platelets 312.  COVID-19 PCR negative.  Influenza A/B PCR negative.  Chest x-ray with no acute cardiopulmonary disease process.  EDP started IV fluid hydration, albuterol neb and placed on BiPAP.  Hospital service consulted for further evaluation management of acute respite failure secondary to COPD exacerbation.   Assessment & Plan:   Principal Problem:   Acute bronchitis with COPD ([place]) Active Problems:   Breast cancer of upper-outer quadrant of left female breast ([place])   COPD, severe ([employer])   Neuroendocrine carcinoma of lung ([place])   Malignant neoplasm of unspecified part of unspecified bronchus or lung ([place])   Acute hypovolemic hyponatremia   Acute respiratory failure with hypercapnia (H[facility])   Acute hypoxic and hypercapnic respiratory failure COPD exacerbation Bronchiectasis Patient presenting to the ED with progressive shortness of breath,  found to have elevated CO2 level on VBG.  Suspect chronic hypercapnia from severe COPD.  Completed 4-day course of antibiotics with cefepime followed by cefdinir. --Pulmonology following, appreciate assistance --Brovana neb twice daily --Pulmicort neb twice daily --Yupelri neb daily --Solu-Medrol 40 mg IV q12h --Mucinex 6 mg p.o. twice daily --Albuterol neb every 4 hours.  Wheezing/shortness of breath --BiPAP nightly and during naps; will need at discharge due to chronic hypercapnia life-threatening respiratory failure --On discharge, pulmonology recommends Bevespi BID or Trelegy daily  Acute metabolic encephalopathy, POA Patient presenting with shortness of breath, confusion found to have an elevated PCO2 likely from underlying COPD. --Continue treatment as above --Minimize sedating meds as this contributes to hypercarbia with avoidance of benzos/opioids if able  Hyponatremia Hypochloremia Etiology likely secondary to dehydration in setting of poor oral intake.  Sodium on admission 121 with chloride 76.  Serum osmolality 251, urine osmolality 350, urine sodium 24.  Improved with IV fluid hydration.  Sodium up to 135. --Sodium chloride 1 g p.o. twice daily --Continue to encourage increased oral intake  Left lower lobe carcinoid tumor Previously followed by Dr. Roxan Hockey with thoracic surgery, patient opted to forego any further follow-up on 09/10/2021 due to poor surgical candidate.  Anxiety: Xanax 0.25 mg qHS PRN  Essential hypertension --Amlodipine 2.5 mg p.o. daily --Metoprolol tartrate --Hydralazine 10 mg IV q4h PRN SBP >324  Chronic diastolic congestive heart failure TTE 12/21/2021 with LVEF 60 to 40%, grade 1 diastolic dysfunction, RVSP 37.8, IVC normal in size.  Repeat chest x-ray 12/25/2021 with persistent bibasilar effusions/atelectasis greater on left.  Received IV Lasix yesterday, pulmonology repeating today. --  Strict I's and O's and daily weights  Hypothyroidism TSH  0.445 on 12/16/2021, within normal limits. --Levothyroxine 50 mcg p.o. daily  Insomnia: Trazodone 50 mg p.o. nightly  GERD: Protonix 40 mg p.o. daily  Weakness/deconditioning/debility: --TOC for SNF placement --Continue therapy efforts while inpatient   DVT prophylaxis: enoxaparin (LOVENOX) injection 40 mg Start: 12/16/21 2200   Code Status: DNR Family Communication: No family present at bedside this morning  Disposition Plan:  Level of care: Progressive Status is: Inpatient  Remains inpatient appropriate because: Remains on IV steroids, pulmonology tapering down, awaiting pulmonology to sign off, plan to discharge to [facility] next 2-3 days.     Consultants:  PCCM Palliative care  Procedures:  TTE  Antimicrobials:  Cefepime 1/8 - 1/9 Cefdinir 1/9 - 1/12     Subjective: Patient seen examined bedside, resting comfortably.  Continues with mild shortness of breath, but continues to improve slightly daily.  Tolerated BiPAP overnight.  Denies headache, no dizziness, no chest pain, no palpitations, no abdominal pain, no fever/chills/night sweats, no nausea/vomiting/diarrhea, no paresthesias.  No acute events overnight per nursing staff.  Objective: Vitals:   12/26/21 0307 12/26/21 0400 12/26/21 0600 12/26/21 0900  BP:   124/60 (!) 117/56  Pulse: 78  80   Resp: 20 20 18    Temp:   98 F (36.7 C)   TempSrc:   Oral   SpO2: 94% 94%    Weight:      Height:        Intake/Output Summary (Last 24 hours) at 12/26/2021 1111 Last data filed at 12/26/2021 0946 Gross per 24 hour  Intake 120.6 ml  Output 1740 ml  Net -1619.4 ml   Filed Weights   12/21/21 0500 12/23/21 0500 12/24/21 0646  Weight: 51.6 kg 51.9 kg 50 kg    Examination:  General exam: Appears calm and comfortable, chronically ill/elderly appearance Respiratory system: Breath sounds slight decreased bilateral bases, mild late expiratory wheezing, no crackles, normal respiratory effort, on 2L [place] at rest  with SPO2 96%. Cardiovascular system: S1 & S2 heard, RRR. No JVD, murmurs, rubs, gallops or clicks. No pedal edema. Gastrointestinal system: Abdomen is nondistended, soft and nontender. No organomegaly or masses felt. Normal bowel sounds heard. Central nervous system: Alert and oriented. No focal neurological deficits. Extremities: Symmetric 5 x 5 power. Skin: No rashes, lesions or ulcers Psychiatry: Judgement and insight appear normal. Mood & affect appropriate.     Data Reviewed: I have personally reviewed following labs and imaging studies  CBC: Recent Labs  Lab 12/21/21 0239 12/22/21 0230 12/23/21 0339 12/24/21 0347 12/26/21 0328  WBC 7.2 5.9 6.4 9.2 10.9*  HGB 14.1 14.4 14.8 13.3 14.0  HCT 47.3* 47.2* 47.9* 44.7 46.6*  MCV 98.3 97.1 95.2 97.8 98.1  PLT 232 207 219 156 096*   Basic Metabolic Panel: Recent Labs  Lab 12/21/21 1232 12/21/21 1335 12/22/21 0230 12/23/21 0339 12/24/21 0347 12/26/21 0328  NA  --  130* 131* 135 134* 135  K  --  5.1 4.8 4.6 4.8 3.8  CL  --  82* 82* 86* 88* 82*  CO2  --  41* 39* 40* 43* >45*  GLUCOSE  --  100* 142* 121* 122* 141*  BUN  --  20 21 22  26* 25*  CREATININE  --  0.43* 0.46 0.51 0.46 0.54  CALCIUM  --  8.9 8.6* 8.9 8.5* 8.6*  MG 2.3  --  2.0 2.3  --   --   PHOS  --  2.1* 4.6 3.0  --   --    GFR: Estimated Creatinine Clearance: 34.9 mL/min (by C-G formula based on SCr of 0.54 mg/dL). Liver Function Tests: Recent Labs  Lab 12/20/21 0805 12/21/21 0239 12/22/21 0230 12/23/21 0339 12/24/21 0347  AST 25 18 15 15  12*  ALT 24 21 20 19 18   ALKPHOS 62 58 53 50 41  BILITOT 0.9 1.1 0.7 1.1 0.8  PROT 5.8* 5.8* 5.5* 5.6* 4.8*  ALBUMIN 3.4* 3.4* 3.2* 3.4* 2.8*   No results for input(s): LIPASE, AMYLASE in the last 168 hours. No results for input(s): AMMONIA in the last 168 hours. Coagulation Profile: No results for input(s): INR, PROTIME in the last 168 hours. Cardiac Enzymes: Recent Labs  Lab 12/21/21 1335  CKTOTAL 23*   CKMB 1.7   BNP (last 3 results) No results for input(s): PROBNP in the last 8760 hours. HbA1C: No results for input(s): HGBA1C in the last 72 hours. CBG: No results for input(s): GLUCAP in the last 168 hours. Lipid Profile: No results for input(s): CHOL, HDL, LDLCALC, TRIG, CHOLHDL, LDLDIRECT in the last 72 hours. Thyroid Function Tests: No results for input(s): TSH, T4TOTAL, FREET4, T3FREE, THYROIDAB in the last 72 hours. Anemia Panel: No results for input(s): VITAMINB12, FOLATE, FERRITIN, TIBC, IRON, RETICCTPCT in the last 72 hours. Sepsis Labs: Recent Labs  Lab 12/21/21 1232 12/21/21 1335 12/22/21 0230  PROCALCITON <0.10  --  <0.10  LATICACIDVEN  --  1.1  --     Recent Results (from the past 240 hour(s))  Respiratory (~20 pathogens) panel by PCR     Status: None   Collection Time: 12/16/21 12:09 PM   Specimen: Nasopharyngeal Swab; Respiratory  Result Value Ref Range Status   Adenovirus NOT DETECTED NOT DETECTED Final   Coronavirus 229E NOT DETECTED NOT DETECTED Final    Comment: (NOTE) The Coronavirus on the Respiratory Panel, DOES NOT test for the novel  Coronavirus (2019 nCoV)    Coronavirus HKU1 NOT DETECTED NOT DETECTED Final   Coronavirus NL63 NOT DETECTED NOT DETECTED Final   Coronavirus OC43 NOT DETECTED NOT DETECTED Final   Metapneumovirus NOT DETECTED NOT DETECTED Final   Rhinovirus / Enterovirus NOT DETECTED NOT DETECTED Final   Influenza A NOT DETECTED NOT DETECTED Final   Influenza B NOT DETECTED NOT DETECTED Final   Parainfluenza Virus 1 NOT DETECTED NOT DETECTED Final   Parainfluenza Virus 2 NOT DETECTED NOT DETECTED Final   Parainfluenza Virus 3 NOT DETECTED NOT DETECTED Final   Parainfluenza Virus 4 NOT DETECTED NOT DETECTED Final   Respiratory Syncytial Virus NOT DETECTED NOT DETECTED Final   Bordetella pertussis NOT DETECTED NOT DETECTED Final   Bordetella Parapertussis NOT DETECTED NOT DETECTED Final   Chlamydophila pneumoniae NOT DETECTED NOT  DETECTED Final   Mycoplasma pneumoniae NOT DETECTED NOT DETECTED Final    Comment: Performed at [facility], [place]. [address], [place], [place] [address]         Radiology Studies: DG CHEST PORT 1 VIEW  Result Date: 12/25/2021 CLINICAL DATA:  Pleural effusion EXAM: PORTABLE CHEST 1 VIEW COMPARISON:  Portable exam 0504 hours compared to 12/21/2021 FINDINGS: Normal heart size, mediastinal contours, and pulmonary vascularity. Atherosclerotic calcification aorta. Small bibasilar pleural effusions and basilar atelectasis slightly greater on LEFT. Upper lungs clear. No new infiltrate or pneumothorax. Bones demineralized. IMPRESSION: Persistent bibasilar effusions and atelectasis greater on LEFT. Aortic Atherosclerosis (ICD10-I70.0). Electronically Signed   By: Lavonia Dana M.D.   On: 12/25/2021 08:20  Scheduled Meds:  amLODipine  2.5 mg Oral Daily   arformoterol  15 mcg Nebulization BID   budesonide (PULMICORT) nebulizer solution  0.5 mg Nebulization Q12H   chlorhexidine  15 mL Mouth Rinse BID   enoxaparin (LOVENOX) injection  40 mg Subcutaneous Q24H   furosemide  40 mg Intravenous Daily   guaiFENesin  600 mg Oral BID   levothyroxine  50 mcg Oral QAC breakfast   mouth rinse  15 mL Mouth Rinse q12n4p   methylPREDNISolone (SOLU-MEDROL) injection  40 mg Intravenous Q12H   pantoprazole  40 mg Oral Daily   revefenacin  175 mcg Nebulization Daily   sodium chloride  1 g Oral BID WC   traZODone  50 mg Oral QHS   Continuous Infusions:  sodium chloride Stopped (12/21/21 1706)     LOS: 11 days    Time spent: 46 minutes spent on chart review, discussion with nursing staff, consultants, updating family and interview/physical exam; more than 50% of that time was spent in counseling and/or coordination of care.    Saban Heinlen J [place] DO [facility] Available via Epic secure chat 7am-7pm After these hours, please refer to coverage provider listed on [email or web address] 12/26/2021, 11:11  AM

## 2021-12-27 DIAGNOSIS — R5381 Other malaise: Secondary | ICD-10-CM

## 2021-12-27 DIAGNOSIS — G9341 Metabolic encephalopathy: Secondary | ICD-10-CM

## 2021-12-27 MED ORDER — METHYLPREDNISOLONE SODIUM SUCC 40 MG IJ SOLR
40.0000 mg | INTRAMUSCULAR | Status: AC
  Administered 2021-12-28 – 2021-12-29 (×2): 40 mg via INTRAVENOUS
  Filled 2021-12-27 (×2): qty 1

## 2021-12-27 NOTE — Progress Notes (Signed)
PROGRESS NOTE    Victoria Rice  WIO:[id number] DOB: 10-20-1931 DOA: 12/15/2021 PCP: Faustino Congress, NP    Brief Narrative:  Victoria Rice is an 88 year old female with past medical history significant for left lower lobe carcinoid tumor, GOLD 3 COPD, breast cancer s/p lumpectomy 2017, essential hypertension, who presented to [facility] ED on 1/8 with complaints of shortness of breath.  Family reports progression in shortness of breath, no new medications.  On EMS arrival, patient was wheezing and given albuterol, Solu-Medrol placed on 3 L nasal cannula.  Recently seen in oncology office outpatient, was short of breath but declined further work-up at that time.  Has been placed on Norvasc for blood pressure that caused lower extremity edema which was discontinued and subsequent placed on Lasix.  In the ED, BP 104/77, HR 92, RR 22, SPO2 99% on BiPAP.  VBG with pH 7.28, PCO2 80.8, PO2 36.5.  Sodium 121, potassium 3.5, chloride 76, CO2 34, glucose 110, BUN 9, creatinine 0.67.  High sensitive troponin 4.  BNP 52.7.  WBC 13.3, hemoglobin 16.0, platelets 312.  COVID-19 PCR negative.  Influenza A/B PCR negative.  Chest x-ray with no acute cardiopulmonary disease process.  EDP started IV fluid hydration, albuterol neb and placed on BiPAP.  Hospital service consulted for further evaluation management of acute respite failure secondary to COPD exacerbation.   Assessment & Plan:   Principal Problem:   Acute bronchitis with COPD ([place]) Active Problems:   Breast cancer of upper-outer quadrant of left female breast ([place])   COPD, severe ([place])   Neuroendocrine carcinoma of lung ([place])   Malignant neoplasm of unspecified part of unspecified bronchus or lung ([place])   Acute hypovolemic hyponatremia   Acute respiratory failure with hypercapnia (HCC)   Acute hypoxic and hypercapnic respiratory failure COPD exacerbation Bronchiectasis Patient presenting to the ED with progressive shortness of breath,  found to have elevated CO2 level on VBG.  Suspect chronic hypercapnia from severe COPD.  Completed 4-day course of antibiotics with cefepime followed by cefdinir. --Pulmonology following, appreciate assistance --Brovana neb twice daily --Pulmicort neb twice daily --Yupelri neb daily --Solu-Medrol 40 mg IV q12h --Mucinex 600 mg p.o. twice daily --Albuterol neb every 4 hours.  Wheezing/shortness of breath --BiPAP nightly and during naps; will need at discharge due to chronic hypercapnia life-threatening respiratory failure --On discharge, pulmonology recommends Bevespi BID or Trelegy daily  Acute metabolic encephalopathy, POA Patient presenting with shortness of breath, confusion found to have an elevated PCO2 likely from underlying COPD. --Continue treatment as above --Minimize sedating meds as this contributes to hypercarbia with avoidance of benzos/opioids if able  Hyponatremia Hypochloremia Etiology likely secondary to dehydration in setting of poor oral intake.  Sodium on admission 121 with chloride 76.  Serum osmolality 251, urine osmolality 350, urine sodium 24.  Improved with IV fluid hydration.  Sodium up to 135. --Sodium chloride 1 g p.o. twice daily --Continue to encourage increased oral intake  Left lower lobe carcinoid tumor Previously followed by Dr. Roxan Hockey with thoracic surgery, patient opted to forego any further follow-up on 09/10/2021 due to poor surgical candidate.  Anxiety: Xanax 0.25 mg qHS PRN  Essential hypertension --Amlodipine 2.5 mg p.o. daily --Metoprolol tartrate --Hydralazine 10 mg IV q4h PRN SBP >426  Chronic diastolic congestive heart failure TTE 12/21/2021 with LVEF 60 to 83%, grade 1 diastolic dysfunction, RVSP 37.8, IVC normal in size.  Repeat chest x-ray 12/25/2021 with persistent bibasilar effusions/atelectasis greater on left.  Received IV Lasix yesterday, pulmonology repeating today. --  Strict I's and O's and daily weights  Hypothyroidism TSH  0.445 on 12/16/2021, within normal limits. --Levothyroxine 50 mcg p.o. daily  Insomnia: Trazodone 50 mg p.o. nightly  GERD: Protonix 40 mg p.o. daily  Weakness/deconditioning/debility: --TOC for SNF placement --Continue therapy efforts while inpatient   DVT prophylaxis: enoxaparin (LOVENOX) injection 40 mg Start: 12/16/21 2200   Code Status: DNR Family Communication: No family present at bedside this morning  Disposition Plan:  Level of care: Progressive Status is: Inpatient  Remains inpatient appropriate because: Remains on IV steroids, pulmonology tapering down, awaiting pulmonology to sign off, plan to discharge to [facility], likely monday.     Consultants:  PCCM Palliative care  Procedures:  TTE  Antimicrobials:  Cefepime 1/8 - 1/9 Cefdinir 1/9 - 1/12     Subjective: Patient seen examined bedside, resting comfortably.  Eating breakfast.  States slept well overnight, "10 hours".  Tolerated BiPAP.  Continues with mild shortness of breath, but continues to improve daily.  Denies headache, no dizziness, no chest pain, no palpitations, no abdominal pain, no fever/chills/night sweats, no nausea/vomiting/diarrhea, no paresthesias.  No acute events overnight per nursing staff.  Objective: Vitals:   12/27/21 0600 12/27/21 0751 12/27/21 0755 12/27/21 0907  BP:    128/62  Pulse:      Resp:      Temp:      TempSrc:      SpO2: 94% 94% 97%   Weight:      Height:        Intake/Output Summary (Last 24 hours) at 12/27/2021 1031 Last data filed at 12/27/2021 1011 Gross per 24 hour  Intake 480 ml  Output 1550 ml  Net -1070 ml   Filed Weights   12/21/21 0500 12/23/21 0500 12/24/21 0646  Weight: 51.6 kg 51.9 kg 50 kg    Examination:  General exam: Appears calm and comfortable, chronically ill/elderly appearance Respiratory system: Breath sounds slight decreased bilateral bases, mild late expiratory wheezing, no crackles, normal respiratory effort, on 2L [place] at  rest with SPO2 94%. Cardiovascular system: S1 & S2 heard, RRR. No JVD, murmurs, rubs, gallops or clicks. No pedal edema. Gastrointestinal system: Abdomen is nondistended, soft and nontender. No organomegaly or masses felt. Normal bowel sounds heard. Central nervous system: Alert and oriented. No focal neurological deficits. Extremities: Symmetric 5 x 5 power. Skin: No rashes, lesions or ulcers Psychiatry: Judgement and insight appear normal. Mood & affect appropriate.     Data Reviewed: I have personally reviewed following labs and imaging studies  CBC: Recent Labs  Lab 12/21/21 0239 12/22/21 0230 12/23/21 0339 12/24/21 0347 12/26/21 0328  WBC 7.2 5.9 6.4 9.2 10.9*  HGB 14.1 14.4 14.8 13.3 14.0  HCT 47.3* 47.2* 47.9* 44.7 46.6*  MCV 98.3 97.1 95.2 97.8 98.1  PLT 232 207 219 156 962*   Basic Metabolic Panel: Recent Labs  Lab 12/21/21 1232 12/21/21 1335 12/22/21 0230 12/23/21 0339 12/24/21 0347 12/26/21 0328  NA  --  130* 131* 135 134* 135  K  --  5.1 4.8 4.6 4.8 3.8  CL  --  82* 82* 86* 88* 82*  CO2  --  41* 39* 40* 43* >45*  GLUCOSE  --  100* 142* 121* 122* 141*  BUN  --  20 21 22  26* 25*  CREATININE  --  0.43* 0.46 0.51 0.46 0.54  CALCIUM  --  8.9 8.6* 8.9 8.5* 8.6*  MG 2.3  --  2.0 2.3  --   --  PHOS  --  2.1* 4.6 3.0  --   --    GFR: Estimated Creatinine Clearance: 34.9 mL/min (by C-G formula based on SCr of 0.54 mg/dL). Liver Function Tests: Recent Labs  Lab 12/21/21 0239 12/22/21 0230 12/23/21 0339 12/24/21 0347  AST 18 15 15  12*  ALT 21 20 19 18   ALKPHOS 58 53 50 41  BILITOT 1.1 0.7 1.1 0.8  PROT 5.8* 5.5* 5.6* 4.8*  ALBUMIN 3.4* 3.2* 3.4* 2.8*   No results for input(s): LIPASE, AMYLASE in the last 168 hours. No results for input(s): AMMONIA in the last 168 hours. Coagulation Profile: No results for input(s): INR, PROTIME in the last 168 hours. Cardiac Enzymes: Recent Labs  Lab 12/21/21 1335  CKTOTAL 23*  CKMB 1.7   BNP (last 3  results) No results for input(s): PROBNP in the last 8760 hours. HbA1C: No results for input(s): HGBA1C in the last 72 hours. CBG: No results for input(s): GLUCAP in the last 168 hours. Lipid Profile: No results for input(s): CHOL, HDL, LDLCALC, TRIG, CHOLHDL, LDLDIRECT in the last 72 hours. Thyroid Function Tests: No results for input(s): TSH, T4TOTAL, FREET4, T3FREE, THYROIDAB in the last 72 hours. Anemia Panel: No results for input(s): VITAMINB12, FOLATE, FERRITIN, TIBC, IRON, RETICCTPCT in the last 72 hours. Sepsis Labs: Recent Labs  Lab 12/21/21 1232 12/21/21 1335 12/22/21 0230  PROCALCITON <0.10  --  <0.10  LATICACIDVEN  --  1.1  --     No results found for this or any previous visit (from the past 240 hour(s)).        Radiology Studies: No results found.      Scheduled Meds:  amLODipine  2.5 mg Oral Daily   arformoterol  15 mcg Nebulization BID   budesonide (PULMICORT) nebulizer solution  0.5 mg Nebulization Q12H   chlorhexidine  15 mL Mouth Rinse BID   enoxaparin (LOVENOX) injection  40 mg Subcutaneous Q24H   guaiFENesin  600 mg Oral BID   levothyroxine  50 mcg Oral QAC breakfast   mouth rinse  15 mL Mouth Rinse q12n4p   methylPREDNISolone (SOLU-MEDROL) injection  40 mg Intravenous Q12H   pantoprazole  40 mg Oral Daily   revefenacin  175 mcg Nebulization Daily   sodium chloride  1 g Oral BID WC   traZODone  50 mg Oral QHS   Continuous Infusions:  sodium chloride Stopped (12/21/21 1706)     LOS: 12 days    Time spent: 41 minutes spent on chart review, discussion with nursing staff, consultants, updating family and interview/physical exam; more than 50% of that time was spent in counseling and/or coordination of care.    Kassadi Presswood J [place] DO [facility] Available via Epic secure chat 7am-7pm After these hours, please refer to coverage provider listed on [email or web address] 12/27/2021, 10:31 AM

## 2021-12-27 NOTE — TOC Progression Note (Addendum)
Transition of Care [facility]) - Progression Note    Patient Details  Name: Victoria Rice MRN: [id number] Date of Birth: 08/16/1931  Transition of [facility]) CM/SW Contact  Leeroy Cha, RN Phone Number: 12/27/2021, 9:31 AM  Clinical Narrative:    [facility]/please return call to see about possible placement. [facility] will review case and call back should have a bed for Monday. Son changed mind to wanting [facility].  [facility]auf[facility] will be updated for [facility]. Expected Discharge Plan: [place] Barriers to Discharge: No Barriers Identified  Expected Discharge Plan and Services Expected Discharge Plan: [place] arrangements for the past 2 months: Single Family Home                                       Social Determinants of Health (SDOH) Interventions    Readmission Risk Interventions No flowsheet data found.

## 2021-12-27 NOTE — Progress Notes (Signed)
Physical Therapy Treatment Patient Details Name: Victoria Rice MRN: [id number] DOB: 12/11/1930 Today's Date: 12/27/2021   History of Present Illness 88 yo female admitted with acute resp failure 2* bronchiectasis, COPD exac. 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  Pt was OOB in [place] with 2 lts nasal at 95% feeling "better".  Son in [place] also in room.  Assisted out of recliner to [facility] required increased time.  General transfer comment: pt required increased time and several rest breaks due to dyspnea.  Assisted on/off BSC pt was unsteady and fatigues easily. Assisted with peri care due to balance instability.  General Gait Details: tolerated an increased distance however required + 2 assist such that recliner was following and monitoring vitals.  Trial RA with amb sats decreased 84% and HR 87.  Required several standing rest breaks and VC's on proper purse lip breathing (COPD).  Pt exhibits limited activity tolerance.  Pt required 2 lts to sustain sats > 92%.  SATURATION QUALIFICATIONS: (This note is used to comply with regulatory documentation for home oxygen)  Patient Saturations on Room Air at Rest = 90%  Patient Saturations on Room Air while Ambulating 25 feet =84%  Patient Saturations on 2 Liters of oxygen while Ambulating = 92%  Please briefly explain why patient needs home oxygen: pt requires supplemental oxygen to achieve therapeutic level.  Pt lives home alone and will need ST Rehab at SNF prior to safe return.   Recommendations for follow up therapy are one component of a multi-disciplinary discharge planning process, led by the attending physician.  Recommendations may be updated based on patient status, additional functional criteria and insurance authorization.  Follow Up Recommendations  Skilled nursing-short term rehab (<3 hours/day)      Assistance Recommended at Discharge Frequent or constant Supervision/Assistance  Patient can return home with the following A little help with walking and/or transfers;A little help with bathing/dressing/bathroom;Assistance with cooking/housework;Direct supervision/assist for medications management;Help with stairs or ramp for entrance   Equipment Recommendations  None recommended by PT    Recommendations for Other Services       Precautions / Restrictions Precautions Precautions: Fall Precaution Comments: monitor vitals Restrictions Weight Bearing Restrictions: No     Mobility  Bed Mobility               General bed mobility comments: OOB in recliner    Transfers Overall transfer level: Needs assistance Equipment used: Rolling walker (2 wheels) Transfers: Sit to/from Stand Sit to Stand: Min assist, Mod assist           General transfer comment: pt required increased time and several rest breaks due to dyspnea.  Assisted on/off BSC pt was unsteady and fatigues easily.    Ambulation/Gait Ambulation/Gait assistance: Mod assist, +2 safety/equipment Gait Distance (Feet): 25 Feet Assistive device: Rolling walker (2 wheels) Gait Pattern/deviations: Step-through pattern, Decreased stride length Gait velocity: decreased     General Gait Details: tolerated an increased distance however required + 2 assist such that recliner was following and monitoring vitals.  Trial RA with amb sats decreased 84% and HR 87.  Required several standing rest breaks and VC's on proper purse lip breathing (COPD).  Pt required 2 lts to sustain sats > 92%.   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        Pertinent Vitals/Pain Pain Assessment Pain Assessment: No/denies pain    Home Living                          Prior Function            PT Goals (current goals can now be found in the care plan section) Progress towards PT goals: Progressing toward goals    Frequency    Min 2X/week      PT Plan Current plan remains appropriate    Co-evaluation              AM-PAC PT "6 Clicks" Mobility   Outcome Measure  Help needed turning from your back to your side while in a flat bed without using bedrails?: A Lot Help needed moving from lying on your back to sitting on the side of a flat bed without using bedrails?: A Lot Help needed moving to and from a bed to a chair (including a wheelchair)?: A Lot Help needed standing up from a chair using your arms (e.g., wheelchair or bedside chair)?: A Lot Help needed to walk in hospital room?: A Lot Help needed climbing 3-5 steps with a railing? : Total 6 Click Score: 11    End of Session Equipment Utilized During Treatment: Gait belt;Oxygen Activity Tolerance: Patient limited by fatigue Patient left: in chair;with call bell/phone within reach;with family/visitor present Nurse Communication: Mobility status PT Visit Diagnosis: Muscle weakness (generalized) (M62.81);Difficulty in walking, not elsewhere classified (R26.2)     Time: 8270-7867 PT Time Calculation (min) (ACUTE ONLY): 29 min  Charges:  $Gait Training: 8-22 mins $Therapeutic Activity: 8-22 mins                     {Jeferson Boozer  PTA Acute  Rehabilitation Services Pager      [phone] Office      [phone]

## 2021-12-27 NOTE — Progress Notes (Signed)
NAME:  Victoria Rice, MRN:  [id number], DOB:  Jul 07, 1931, LOS: 12 ADMISSION DATE:  12/15/2021, CONSULTATION DATE:  1/8 REFERRING MD:  Cresenciano Lick, CHIEF COMPLAINT:  sob    BRIEF  40 yowf remote smoker with GOLD 3 COPD dx 07/18/16 with reversibility and nl dlco but declined f/u by Dr Carlis Abbott and now admitted by [facility] with dx of aecopd and PCCM asked to eval pm 1/8 p declining referral 11/06/21 to return to pulmonary office for new dx of hypoxemic resp failure and breathing worse since ? Nov 2022 but esp x 10 d PTA     Seen several weeks PTA in oncology office was short of breath declined work-up. Recently was placed on Norvasc for blood pressure but this was complicated by lower extremity edema and hence was placed on Lasix-they discontinued Norvasc recently   She was wheezing apparently according to EMS given albuterol 04/07/2024 Solu-Medrol placed on 3 L nasal cannula   Pertinent  Medical History  Breast cancer, Hypothyroidism, Lt lower lobe carcinoid tumor Breast ca no chemo or RT LLL carcinoid on navigational bx 08/2016 followed by Roxan Hockey conservatively  Chronic Hypercarbic Respiratory Failure   Studies:  PFT 07/18/16 >> FEV1 0.6 (42%), FEV1% 48, TLC 5.06 (117%), DLCO 80%, +BD Echo 09/26/19 >> EF 60 to 65% CT chest 09/10/21 >> smooth margin ovoid nodule LLL 1.9 x 1.0 cm, multiple smaller nodules stable, scattered cylindrical and varicose BTX mostly in RML and lingula  SUBJECTIVE/OVERNIGHT/INTERVAL HX  1/8 Admit on Bipap with severe hypoxemic and hypercarbic Resp failure . CCM consult - Rx as AECOPD but most likely this ia AE-bronchiectasis.  According to the nurse walking in the ICU. 1/11 Had trouble sleeping last night and had trouble controlling her bladder last night.  Breathing okay.  Not having cough and doesn't feel wheezy. PCCM s/o.  1/14 CCM called back for worsening hypercarbia.  1/15 ABG / mental status improved with BiPAP. ECHO with grade I DD, mild cor pulmonale  1/17 Has had  trouble tolerating BIPAP at night.  Diuresed.  Interim History / Subjective:  Overall she is feeling well.  Objective   Blood pressure 128/62, pulse 71, temperature 97.7 F (36.5 C), temperature source Axillary, resp. rate 18, height 5' (1.524 m), weight 50 kg, SpO2 97 %.    FiO2 (%):  [28 %-30 %] 28 %   Intake/Output Summary (Last 24 hours) at 12/27/2021 1204 Last data filed at 12/27/2021 1011 Gross per 24 hour  Intake 480 ml  Output 1550 ml  Net -1070 ml    Filed Weights   12/21/21 0500 12/23/21 0500 12/24/21 0646  Weight: 51.6 kg 51.9 kg 50 kg   Exam: General: frail appearing elderly woman sitting up in the recliner in NAD. HEENT: [place]/AT, eyes anicteric Neuro: awake and alert, moving all extremities, globally weak CV: S1S2, RRR PULM: rhales resolved, no wheezing GI: soft, NT, thin Extremities: no cyanosis or edema Skin:  Warm, dry, no diffuse rashes, thin skin.   Assessment & Plan:   Acute hypoxic and hypercapnic respiratory failure from COPD and bronchiectasis exacerbation. Likely superimposed acute pulmonary edema contributing.   She has chronic hypercapnia that has developed over time from severe obstructive lung disease (FEV1 42% of predicted in 2017).  Baseline regimen is spiriva, combivent, PRN albuterol.  She likely had a component of acute pulmonary edema with bilateral pleural effusions that were contributing, but have improved since diuresis.  BNP 72 (from 164) -Continue Brovana, Pulmicort, Yupelri - Wean saturations to maintain  greater than 88%.  Needs ambulatory desaturation screening before being discharged. - Pulmonary hygiene - Continue weaning steroids-decrease to 40 mg Solu-Medrol once daily.  Would continue to taper over the next week. - Needs nocturnal noninvasive positive pressure ventilation at discharge.  Have discussed with case management today and orders have been placed for BiPAP rate 20, inspiratory pressure 18, expiratory pressure 6, FiO2 30%. -  Outpatient pulmonary follow-up scheduled 12/30/21 with Rexene Edison, NP. - Recommend discharge on Bevespi twice daily or Trelegy once daily, whichever is preferred for her insurance.  If triple inhalers are not covered, any combination of LAMA, LABA, ICS is appropriate.  Acute metabolic encephalopathy due to hypercarbia, hypoxia-improved -Avoid opiates and benzos -Have stressed the importance of compliance with BiPAP whenever she is sleeping.  LLL carcinoid tumor Previously followed by Dr. Roxan Hockey with thoracic surgery.  Pt opted 09/10/2021 to forego any further follow up, poor surgical candidate    Deconditioning -Agree with planned admission for rehabilitation after discharge  Son at bedside during rounds.   PCCM will sign off, please call with questions.    Best practice:  Per Primary   Future Appointments  Date Time Provider [place]  12/30/2021  2:00 PM Parrett, Fonnie Mu, NP [facility] None      Julian Hy, DO 12/27/21 2:41 PM [facility]

## 2021-12-28 LAB — BASIC METABOLIC PANEL
BUN: 26 mg/dL — ABNORMAL HIGH (ref 8–23)
CO2: >45 mmol/L — ABNORMAL HIGH (ref 22–32)
Calcium: 8.3 mg/dL — ABNORMAL LOW (ref 8.9–10.3)
Chloride: 84 mmol/L — ABNORMAL LOW (ref 98–111)
Creatinine, Ser: 0.7 mg/dL (ref 0.44–1.00)
GFR, Estimated: >60 mL/min (ref >60)
Glucose, Bld: 97 mg/dL (ref 70–99)
Potassium: 3.5 mmol/L (ref 3.5–5.1)
Sodium: 135 mmol/L (ref 135–145)

## 2021-12-28 MED ORDER — PREDNISONE 10 MG PO TABS
30.0000 mg | ORAL_TABLET | Freq: Every day | ORAL | Status: DC
Start: 2022-01-01 — End: 2021-12-30

## 2021-12-28 MED ORDER — BISACODYL 10 MG RE SUPP
10.0000 mg | Freq: Once | RECTAL | Status: AC
  Administered 2021-12-28: 10 mg via RECTAL
  Filled 2021-12-28: qty 1

## 2021-12-28 MED ORDER — POLYETHYLENE GLYCOL 3350 17 G PO PACK
17.0000 g | PACK | Freq: Every day | ORAL | Status: DC | PRN
  Administered 2021-12-28 – 2021-12-29 (×2): 17 g via ORAL
  Filled 2021-12-28 (×2): qty 1

## 2021-12-28 MED ORDER — PREDNISONE 10 MG PO TABS: 10.0000 mg | ORAL_TABLET | Freq: Every day | ORAL | Status: DC

## 2021-12-28 MED ORDER — SENNOSIDES-DOCUSATE SODIUM 8.6-50 MG PO TABS
1.0000 | ORAL_TABLET | Freq: Two times a day (BID) | ORAL | Status: DC
  Administered 2021-12-28 – 2021-12-30 (×5): 1 via ORAL
  Filled 2021-12-28 (×5): qty 1

## 2021-12-28 MED ORDER — PREDNISONE 20 MG PO TABS
40.0000 mg | ORAL_TABLET | Freq: Every day | ORAL | Status: DC
  Administered 2021-12-30: 40 mg via ORAL
  Filled 2021-12-28: qty 2

## 2021-12-28 MED ORDER — FUROSEMIDE 10 MG/ML IJ SOLN
40.0000 mg | Freq: Every day | INTRAMUSCULAR | Status: DC
  Administered 2021-12-28 – 2021-12-30 (×3): 40 mg via INTRAVENOUS
  Filled 2021-12-28 (×3): qty 4

## 2021-12-28 MED ORDER — PREDNISONE 20 MG PO TABS: 20.0000 mg | ORAL_TABLET | Freq: Every day | ORAL | Status: DC

## 2021-12-28 NOTE — Progress Notes (Signed)
Patient c/o constipation this shift, Miralax, Senokot and Dulcolax suppository given.  Small amount of formed stool after suppository.

## 2021-12-28 NOTE — Progress Notes (Signed)
Patient with 2 brief episodes of A-fib with RVR. This morning A-fib lasted about 9 min and then patient converted back to NSR. Patient asymptomatic. Vitals stable. On call provider made aware.

## 2021-12-28 NOTE — Plan of Care (Signed)
°  Problem: Respiratory: Goal: Ability to maintain a clear airway will improve Outcome: Progressing Goal: Ability to maintain adequate ventilation will improve Outcome: Progressing   Problem: Education: Goal: Knowledge of General Education information will improve Description: Including pain rating scale, medication(s)/side effects and non-pharmacologic comfort measures Outcome: Progressing   Problem: Clinical Measurements: Goal: Respiratory complications will improve Outcome: Progressing

## 2021-12-28 NOTE — Progress Notes (Signed)
Physical Therapy Treatment Patient Details Name: Victoria Rice MRN: [id number] DOB: [birth year] Today's Date: 12/28/2021   History of Present Illness 88 yo female admitted with acute resp failure 2* bronchiectasis, COPD exac. Hx of severe COPD    PT Comments    Pt progressing slowly.  Daughter and Son in [place] present during session.   Assisted pt to bathroom.  Required Mod Assist to rise from lower commode level.  Required assist with peri care due to balance instability.  Amb a limited distance in hallway.  Trial RA decreased to 80%.  Pt required 2 lts to achieve sats >90%.  Present with 2/4 dyspnea.  Instructed on purse lip breathing.  Left pt in recliner with chair alarm active.   Pt lives home alone and will need ST Rehab at SNF prior to safe return.  Recommendations for follow up therapy are one component of a multi-disciplinary discharge planning process, led by the attending physician.  Recommendations may be updated based on patient status, additional functional criteria and insurance authorization.  Follow Up Recommendations  Skilled nursing-short term rehab (<3 hours/day)     Assistance Recommended at Discharge    Patient can return home with the following A little help with walking and/or transfers;A little help with bathing/dressing/bathroom;Assistance with cooking/housework;Direct supervision/assist for medications management;Help with stairs or ramp for entrance   Equipment Recommendations  None recommended by PT    Recommendations for Other Services       Precautions / Restrictions Precautions Precautions: Fall Precaution Comments: monitor vitals Restrictions Weight Bearing Restrictions: No     Mobility  Bed Mobility               General bed mobility comments: OOB in recliner    Transfers Overall transfer level: Needs assistance Equipment used: Rolling walker (2 wheels) Transfers: Sit to/from Stand Sit to Stand: Min assist, Mod assist            General transfer comment: pt required increased time and several rest breaks due to dyspnea.  Assisted on/off toilet  pt was unsteady and fatigues easily. RA decreased to 80% so reapplied only 2 lts to achieve sats >90%.    Ambulation/Gait Ambulation/Gait assistance: Min assist, Mod assist, +2 safety/equipment Gait Distance (Feet): 32 Feet Assistive device: Rolling walker (2 wheels) Gait Pattern/deviations: Step-through pattern, Decreased stride length Gait velocity: decreased     General Gait Details: tolerated an increased distance however required + 2 assist such that recliner was following and monitoring vitals.  Trial RA with amb sats decreased 80% and HR 87.  Required several standing rest breaks and VC's on proper purse lip breathing (COPD).  Pt required 2 lts to sustain sats > 90%.   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        Pertinent Vitals/Pain Pain Assessment Pain Assessment: No/denies pain    Home Living                          Prior Function            PT Goals (current goals can now be found in the care plan section) Progress towards PT goals: Progressing toward goals    Frequency    Min 2X/week      PT Plan Current plan remains appropriate    Co-evaluation              AM-PAC PT "6 Clicks" Mobility   Outcome Measure  Help needed turning from your back to your side while in a flat bed without using bedrails?: A  Little Help needed moving from lying on your back to sitting on the side of a flat bed without using bedrails?: A Little Help needed moving to and from a bed to a chair (including a wheelchair)?: A Little Help needed standing up from a chair using your arms (e.g., wheelchair or bedside chair)?: A Lot Help needed to walk in hospital room?: A Lot Help needed climbing 3-5 steps with a railing? : Total 6 Click Score: 14    End of Session Equipment Utilized During Treatment: Gait belt;Oxygen Activity Tolerance: Patient limited by fatigue Patient left: in chair;with call bell/phone within reach;with family/visitor present Nurse Communication: Mobility status PT Visit Diagnosis: Muscle weakness (generalized) (M62.81);Difficulty in walking, not elsewhere classified (R26.2)     Time: 4166-0630 PT Time Calculation (min) (ACUTE ONLY): 33 min  Charges:  $Gait Training: 8-22 mins $Therapeutic Activity: 8-22 mins                     Rica Koyanagi  PTA Acute  Rehabilitation Services Pager      [phone] Office      [phone]

## 2021-12-28 NOTE — Progress Notes (Signed)
PROGRESS NOTE    Victoria Rice  NWG:[id number] DOB: 07-Sep-1931 DOA: 12/15/2021 PCP: Faustino Congress, NP    Brief Narrative:  Victoria Rice is an 88 year old female with past medical history significant for left lower lobe carcinoid tumor, GOLD 3 COPD, breast cancer s/p lumpectomy 2017, essential hypertension, who presented to [facility] ED on 1/8 with complaints of shortness of breath.  Family reports progression in shortness of breath, no new medications.  On EMS arrival, patient was wheezing and given albuterol, Solu-Medrol placed on 3 L nasal cannula.  Recently seen in oncology office outpatient, was short of breath but declined further work-up at that time.  Has been placed on Norvasc for blood pressure that caused lower extremity edema which was discontinued and subsequent placed on Lasix.  In the ED, BP 104/77, HR 92, RR 22, SPO2 99% on BiPAP.  VBG with pH 7.28, PCO2 80.8, PO2 36.5.  Sodium 121, potassium 3.5, chloride 76, CO2 34, glucose 110, BUN 9, creatinine 0.67.  High sensitive troponin 4.  BNP 52.7.  WBC 13.3, hemoglobin 16.0, platelets 312.  COVID-19 PCR negative.  Influenza A/B PCR negative.  Chest x-ray with no acute cardiopulmonary disease process.  EDP started IV fluid hydration, albuterol neb and placed on BiPAP.  Hospital service consulted for further evaluation management of acute respite failure secondary to COPD exacerbation.   Assessment & Plan:   Principal Problem:   Acute bronchitis with COPD ([place]) Active Problems:   Breast cancer of upper-outer quadrant of left female breast ([place])   COPD, severe ([place])   Neuroendocrine carcinoma of lung ([place])   Malignant neoplasm of unspecified part of unspecified bronchus or lung ([place])   Acute hypovolemic hyponatremia   Acute respiratory failure with hypercapnia ([facility])   Acute hypoxic and hypercapnic respiratory failure COPD exacerbation Bronchiectasis Patient presenting to the ED with progressive shortness of breath,  found to have elevated CO2 level on VBG.  Suspect chronic hypercapnia from severe COPD.  Completed 4-day course of antibiotics with cefepime followed by cefdinir. --Pulmonology now signed off 1/20 --Brovana neb twice daily --Pulmicort neb twice daily --Yupelri neb daily --Solu-Medrol 40 mg IV q24h; plan to transition to prednisone taper on Monday --Mucinex 600 mg p.o. twice daily --Albuterol neb every 4 hours.  Wheezing/shortness of breath --BiPAP nightly and during naps; will need at discharge due to chronic hypercapnia life-threatening respiratory failure --On discharge, pulmonology recommends Bevespi BID or Trelegy daily  Acute metabolic encephalopathy, POA Patient presenting with shortness of breath, confusion found to have an elevated PCO2 likely from underlying COPD. --Continue treatment as above --Minimize sedating meds as this contributes to hypercarbia with avoidance of benzos/opioids if able  Hyponatremia Hypochloremia Etiology likely secondary to dehydration in setting of poor oral intake.  Sodium on admission 121 with chloride 76.  Serum osmolality 251, urine osmolality 350, urine sodium 24.  Improved with IV fluid hydration.  Sodium up to 135. --Sodium chloride 1 g p.o. twice daily --Continue to encourage increased oral intake  Left lower lobe carcinoid tumor Previously followed by Dr. Roxan Hockey with thoracic surgery, patient opted to forego any further follow-up on 09/10/2021 due to poor surgical candidate.  Anxiety: Xanax 0.25 mg qHS PRN  Essential hypertension --Amlodipine 2.5 mg p.o. daily --Metoprolol tartrate --Hydralazine 10 mg IV q4h PRN SBP >578  Chronic diastolic congestive heart failure TTE 12/21/2021 with LVEF 60 to 46%, grade 1 diastolic dysfunction, RVSP 37.8, IVC normal in size.  Repeat chest x-ray 12/25/2021 with persistent bibasilar effusions/atelectasis greater on  left.  --Furosemide 40 mg IV every 24 hours --Strict I's and O's and daily  weights --Repeat BMP in a.m.  Hypothyroidism TSH 0.445 on 12/16/2021, within normal limits. --Levothyroxine 50 mcg p.o. daily  Insomnia: Trazodone 50 mg p.o. nightly  GERD: Protonix 40 mg p.o. daily  Weakness/deconditioning/debility: --TOC for SNF placement --Continue therapy efforts while inpatient   DVT prophylaxis: enoxaparin (LOVENOX) injection 40 mg Start: 12/16/21 2200   Code Status: DNR Family Communication: No family present at bedside this morning  Disposition Plan:  Level of care: Progressive Status is: Inpatient  Remains inpatient appropriate because: Remains on IV steroids and Lasix, plan to discharge to [facility], likely monday.     Consultants:  PCCM - signed off 1/20 Palliative care  Procedures:  TTE  Antimicrobials:  Cefepime 1/8 - 1/9 Cefdinir 1/9 - 1/12     Subjective: Patient seen examined bedside, resting comfortably.  RN present.  Reports no bowel movement for few days.  Tolerated BiPAP overnight.  No other specific complaints or concerns at this time. Denies headache, no dizziness, no chest pain, no palpitations, no abdominal pain, no fever/chills/night sweats, no nausea/vomiting/diarrhea, no paresthesias.  No acute events overnight per nursing staff.  Objective: Vitals:   12/28/21 0525 12/28/21 0730 12/28/21 0759 12/28/21 0801  BP:      Pulse:      Resp:      Temp:      TempSrc:      SpO2: 91% 94% 94% 94%  Weight:      Height:        Intake/Output Summary (Last 24 hours) at 12/28/2021 1159 Last data filed at 12/28/2021 0521 Gross per 24 hour  Intake 480 ml  Output 900 ml  Net -420 ml   Filed Weights   12/23/21 0500 12/24/21 0646 12/28/21 0516  Weight: 51.9 kg 50 kg 46.5 kg    Examination:  General exam: Appears calm and comfortable, chronically ill/elderly appearance Respiratory system: Breath sounds slight decreased bilateral bases, mild late expiratory wheezing, no crackles, normal respiratory effort, on 2.5L [place] at  rest with SPO2 94%. Cardiovascular system: S1 & S2 heard, RRR. No JVD, murmurs, rubs, gallops or clicks. No pedal edema. Gastrointestinal system: Abdomen is nondistended, soft and nontender. No organomegaly or masses felt. Normal bowel sounds heard. Central nervous system: Alert and oriented. No focal neurological deficits. Extremities: Symmetric 5 x 5 power. Skin: No rashes, lesions or ulcers Psychiatry: Judgement and insight appear normal. Mood & affect appropriate.     Data Reviewed: I have personally reviewed following labs and imaging studies  CBC: Recent Labs  Lab 12/22/21 0230 12/23/21 0339 12/24/21 0347 12/26/21 0328  WBC 5.9 6.4 9.2 10.9*  HGB 14.4 14.8 13.3 14.0  HCT 47.2* 47.9* 44.7 46.6*  MCV 97.1 95.2 97.8 98.1  PLT 207 219 156 751*   Basic Metabolic Panel: Recent Labs  Lab 12/21/21 1232 12/21/21 1335 12/21/21 1335 12/22/21 0230 12/23/21 0339 12/24/21 0347 12/26/21 0328 12/28/21 0600  NA  --  130*   < > 131* 135 134* 135 135  K  --  5.1   < > 4.8 4.6 4.8 3.8 3.5  CL  --  82*   < > 82* 86* 88* 82* 84*  CO2  --  41*   < > 39* 40* 43* >45* >45*  GLUCOSE  --  100*   < > 142* 121* 122* 141* 97  BUN  --  20   < > 21 22 26*  25* 26*  CREATININE  --  0.43*   < > 0.46 0.51 0.46 0.54 0.70  CALCIUM  --  8.9   < > 8.6* 8.9 8.5* 8.6* 8.3*  MG 2.3  --   --  2.0 2.3  --   --   --   PHOS  --  2.1*  --  4.6 3.0  --   --   --    < > = values in this interval not displayed.   GFR: Estimated Creatinine Clearance: 34.9 mL/min (by C-G formula based on SCr of 0.7 mg/dL). Liver Function Tests: Recent Labs  Lab 12/22/21 0230 12/23/21 0339 12/24/21 0347  AST 15 15 12*  ALT 20 19 18   ALKPHOS 53 50 41  BILITOT 0.7 1.1 0.8  PROT 5.5* 5.6* 4.8*  ALBUMIN 3.2* 3.4* 2.8*   No results for input(s): LIPASE, AMYLASE in the last 168 hours. No results for input(s): AMMONIA in the last 168 hours. Coagulation Profile: No results for input(s): INR, PROTIME in the last 168  hours. Cardiac Enzymes: Recent Labs  Lab 12/21/21 1335  CKTOTAL 23*  CKMB 1.7   BNP (last 3 results) No results for input(s): PROBNP in the last 8760 hours. HbA1C: No results for input(s): HGBA1C in the last 72 hours. CBG: No results for input(s): GLUCAP in the last 168 hours. Lipid Profile: No results for input(s): CHOL, HDL, LDLCALC, TRIG, CHOLHDL, LDLDIRECT in the last 72 hours. Thyroid Function Tests: No results for input(s): TSH, T4TOTAL, FREET4, T3FREE, THYROIDAB in the last 72 hours. Anemia Panel: No results for input(s): VITAMINB12, FOLATE, FERRITIN, TIBC, IRON, RETICCTPCT in the last 72 hours. Sepsis Labs: Recent Labs  Lab 12/21/21 1232 12/21/21 1335 12/22/21 0230  PROCALCITON <0.10  --  <0.10  LATICACIDVEN  --  1.1  --     No results found for this or any previous visit (from the past 240 hour(s)).        Radiology Studies: No results found.      Scheduled Meds:  amLODipine  2.5 mg Oral Daily   arformoterol  15 mcg Nebulization BID   budesonide (PULMICORT) nebulizer solution  0.5 mg Nebulization Q12H   chlorhexidine  15 mL Mouth Rinse BID   enoxaparin (LOVENOX) injection  40 mg Subcutaneous Q24H   furosemide  40 mg Intravenous Daily   guaiFENesin  600 mg Oral BID   levothyroxine  50 mcg Oral QAC breakfast   mouth rinse  15 mL Mouth Rinse q12n4p   methylPREDNISolone (SOLU-MEDROL) injection  40 mg Intravenous Q24H   pantoprazole  40 mg Oral Daily   revefenacin  175 mcg Nebulization Daily   senna-docusate  1 tablet Oral BID   sodium chloride  1 g Oral BID WC   traZODone  50 mg Oral QHS   Continuous Infusions:  sodium chloride Stopped (12/21/21 1706)     LOS: 13 days    Time spent: 41 minutes spent on chart review, discussion with nursing staff, consultants, updating family and interview/physical exam; more than 50% of that time was spent in counseling and/or coordination of care.    Geralyn Figiel J [place] DO [facility] Available via  Epic secure chat 7am-7pm After these hours, please refer to coverage provider listed on [email or web address] 12/28/2021, 11:59 AM

## 2021-12-29 LAB — BASIC METABOLIC PANEL
BUN: 26 mg/dL — ABNORMAL HIGH (ref 8–23)
CO2: >45 mmol/L — ABNORMAL HIGH (ref 22–32)
Calcium: 8.3 mg/dL — ABNORMAL LOW (ref 8.9–10.3)
Chloride: 84 mmol/L — ABNORMAL LOW (ref 98–111)
Creatinine, Ser: 0.67 mg/dL (ref 0.44–1.00)
GFR, Estimated: >60 mL/min (ref >60)
Glucose, Bld: 99 mg/dL (ref 70–99)
Potassium: 3.8 mmol/L (ref 3.5–5.1)
Sodium: 136 mmol/L (ref 135–145)

## 2021-12-29 LAB — MAGNESIUM: Magnesium: 2.1 mg/dL (ref 1.7–2.4)

## 2021-12-29 MED ORDER — POLYVINYL ALCOHOL 1.4 % OP SOLN
1.0000 [drp] | OPHTHALMIC | Status: DC | PRN
  Administered 2021-12-29 – 2021-12-30 (×2): 1 [drp] via OPHTHALMIC
  Filled 2021-12-29: qty 15

## 2021-12-29 MED ORDER — BISACODYL 10 MG RE SUPP
10.0000 mg | Freq: Every day | RECTAL | Status: DC | PRN
  Administered 2021-12-29: 10 mg via RECTAL
  Filled 2021-12-29: qty 1

## 2021-12-29 MED ORDER — SORBITOL 70 % SOLN
960.0000 mL | TOPICAL_OIL | Freq: Once | ORAL | Status: AC
  Administered 2021-12-29: 960 mL via RECTAL
  Filled 2021-12-29: qty 473

## 2021-12-29 NOTE — Progress Notes (Signed)
PROGRESS NOTE    Victoria Rice  KZS:[id number] DOB: [birth year] DOA: 12/15/2021 PCP: Faustino Congress, NP    Brief Narrative:  Victoria Rice is an 88 year old female with past medical history significant for left lower lobe carcinoid tumor, GOLD 3 COPD, breast cancer s/p lumpectomy 2017, essential hypertension, who presented to [facility] ED on 1/8 with complaints of shortness of breath.  Family reports progression in shortness of breath, no new medications.  On EMS arrival, patient was wheezing and given albuterol, Solu-Medrol placed on 3 L nasal cannula.  Recently seen in oncology office outpatient, was short of breath but declined further work-up at that time.  Has been placed on Norvasc for blood pressure that caused lower extremity edema which was discontinued and subsequent placed on Lasix.  In the ED, BP 104/77, HR 92, RR 22, SPO2 99% on BiPAP.  VBG with pH 7.28, PCO2 80.8, PO2 36.5.  Sodium 121, potassium 3.5, chloride 76, CO2 34, glucose 110, BUN 9, creatinine 0.67.  High sensitive troponin 4.  BNP 52.7.  WBC 13.3, hemoglobin 16.0, platelets 312.  COVID-19 PCR negative.  Influenza A/B PCR negative.  Chest x-ray with no acute cardiopulmonary disease process.  EDP started IV fluid hydration, albuterol neb and placed on BiPAP.  Hospital service consulted for further evaluation management of acute respite failure secondary to COPD exacerbation.   Assessment & Plan:   Principal Problem:   Acute bronchitis with COPD ([place]) Active Problems:   Breast cancer of upper-outer quadrant of left female breast ([place])   COPD, severe ([place])   Neuroendocrine carcinoma of lung ([place])   Malignant neoplasm of unspecified part of unspecified bronchus or lung ([place])   Acute hypovolemic hyponatremia   Acute respiratory failure with hypercapnia (HCC)   Acute hypoxic and hypercapnic respiratory failure COPD exacerbation Bronchiectasis Patient presenting to the ED with progressive shortness of breath,  found to have elevated CO2 level on VBG.  Suspect chronic hypercapnia from severe COPD.  Completed 4-day course of antibiotics with cefepime followed by cefdinir. --Pulmonology now signed off 1/20 --Brovana neb twice daily --Pulmicort neb twice daily --Yupelri neb daily --Solu-Medrol 40 mg IV q24h; plan to transition to prednisone taper tomorrow --Mucinex 600 mg p.o. twice daily --Albuterol neb every 4 hours.  Wheezing/shortness of breath --BiPAP nightly and during naps; will need at discharge due to chronic hypercapnia life-threatening respiratory failure --On discharge, pulmonology recommends Bevespi BID or Trelegy daily (If triple inhalers are not covered, any combination of LAMA, LABA, ICS is appropriate).  Acute metabolic encephalopathy, POA Patient presenting with shortness of breath, confusion found to have an elevated PCO2 likely from underlying COPD. --Continue treatment as above --Minimize sedating meds as this contributes to hypercarbia with avoidance of benzos/opioids if able  Hyponatremia Hypochloremia Etiology likely secondary to dehydration in setting of poor oral intake.  Sodium on admission 121 with chloride 76.  Serum osmolality 251, urine osmolality 350, urine sodium 24.  Improved with IV fluid hydration.  Sodium up to 135. --Sodium chloride 1 g p.o. twice daily --Continue to encourage increased oral intake  Left lower lobe carcinoid tumor Previously followed by Dr. Roxan Hockey with thoracic surgery, patient opted to forego any further follow-up on 09/10/2021 due to poor surgical candidate.  Anxiety: Xanax 0.25 mg qHS PRN  Essential hypertension --Amlodipine 2.5 mg p.o. daily --Metoprolol tartrate --Hydralazine 10 mg IV q4h PRN SBP >732  Chronic diastolic congestive heart failure TTE 12/21/2021 with LVEF 60 to 20%, grade 1 diastolic dysfunction, RVSP 37.8, IVC normal  in size.  Repeat chest x-ray 12/25/2021 with persistent bibasilar effusions/atelectasis greater on  left.  --Furosemide 40 mg IV every 24 hours --Strict I's and O's and daily weights --Repeat BMP in a.m.  Hypothyroidism TSH 0.445 on 12/16/2021, within normal limits. --Levothyroxine 50 mcg p.o. daily  Insomnia: Trazodone 50 mg p.o. nightly  GERD: Protonix 40 mg p.o. daily  Weakness/deconditioning/debility: --TOC for SNF placement --Continue therapy efforts while inpatient   DVT prophylaxis: enoxaparin (LOVENOX) injection 40 mg Start: 12/16/21 2200   Code Status: DNR Family Communication: No family present at bedside this morning  Disposition Plan:  Level of care: Progressive Status is: Inpatient  Remains inpatient appropriate because: Remains on IV steroids and Lasix, plan to discharge to [facility], likely tomorrow.     Consultants:  PCCM - signed off 1/20 Palliative care  Procedures:  TTE  Antimicrobials:  Cefepime 1/8 - 1/9 Cefdinir 1/9 - 1/12     Subjective: Patient seen examined bedside, resting comfortably.  Lying in bed eating breakfast.    Tolerated BiPAP overnight with good sleep.  No other specific complaints or concerns at this time. Denies headache, no dizziness, no chest pain, no palpitations, no abdominal pain, no fever/chills/night sweats, no nausea/vomiting/diarrhea, no paresthesias.  No acute events overnight per nursing staff.  Objective: Vitals:   12/28/21 2111 12/28/21 2338 12/29/21 0513 12/29/21 0819  BP:   110/61   Pulse: 78 81 74   Resp: 20 20 20    Temp:   97.9 F (36.6 C)   TempSrc:   Oral   SpO2: 100% 97% 96% 93%  Weight:   46.8 kg   Height:        Intake/Output Summary (Last 24 hours) at 12/29/2021 0945 Last data filed at 12/29/2021 0940 Gross per 24 hour  Intake 320 ml  Output 1150 ml  Net -830 ml   Filed Weights   12/24/21 0646 12/28/21 0516 12/29/21 0513  Weight: 50 kg 46.5 kg 46.8 kg    Examination:  General exam: Appears calm and comfortable, chronically ill/elderly appearance Respiratory system: Breath sounds  slight decreased bilateral bases, mild late expiratory wheezing, no crackles, normal respiratory effort, on 2L [place] at rest with SPO2 96%. Cardiovascular system: S1 & S2 heard, RRR. No JVD, murmurs, rubs, gallops or clicks. No pedal edema. Gastrointestinal system: Abdomen is nondistended, soft and nontender. No organomegaly or masses felt. Normal bowel sounds heard. Central nervous system: Alert and oriented. No focal neurological deficits. Extremities: Symmetric 5 x 5 power. Skin: No rashes, lesions or ulcers Psychiatry: Judgement and insight appear normal. Mood & affect appropriate.     Data Reviewed: I have personally reviewed following labs and imaging studies  CBC: Recent Labs  Lab 12/23/21 0339 12/24/21 0347 12/26/21 0328  WBC 6.4 9.2 10.9*  HGB 14.8 13.3 14.0  HCT 47.9* 44.7 46.6*  MCV 95.2 97.8 98.1  PLT 219 156 765*   Basic Metabolic Panel: Recent Labs  Lab 12/23/21 0339 12/24/21 0347 12/26/21 0328 12/28/21 0600 12/29/21 0358  NA 135 134* 135 135 136  K 4.6 4.8 3.8 3.5 3.8  CL 86* 88* 82* 84* 84*  CO2 40* 43* >45* >45* >45*  GLUCOSE 121* 122* 141* 97 99  BUN 22 26* 25* 26* 26*  CREATININE 0.51 0.46 0.54 0.70 0.67  CALCIUM 8.9 8.5* 8.6* 8.3* 8.3*  MG 2.3  --   --   --  2.1  PHOS 3.0  --   --   --   --  GFR: Estimated Creatinine Clearance: 34.9 mL/min (by C-G formula based on SCr of 0.67 mg/dL). Liver Function Tests: Recent Labs  Lab 12/23/21 0339 12/24/21 0347  AST 15 12*  ALT 19 18  ALKPHOS 50 41  BILITOT 1.1 0.8  PROT 5.6* 4.8*  ALBUMIN 3.4* 2.8*   No results for input(s): LIPASE, AMYLASE in the last 168 hours. No results for input(s): AMMONIA in the last 168 hours. Coagulation Profile: No results for input(s): INR, PROTIME in the last 168 hours. Cardiac Enzymes: No results for input(s): CKTOTAL, CKMB, CKMBINDEX, TROPONINI in the last 168 hours.  BNP (last 3 results) No results for input(s): PROBNP in the last 8760 hours. HbA1C: No results  for input(s): HGBA1C in the last 72 hours. CBG: No results for input(s): GLUCAP in the last 168 hours. Lipid Profile: No results for input(s): CHOL, HDL, LDLCALC, TRIG, CHOLHDL, LDLDIRECT in the last 72 hours. Thyroid Function Tests: No results for input(s): TSH, T4TOTAL, FREET4, T3FREE, THYROIDAB in the last 72 hours. Anemia Panel: No results for input(s): VITAMINB12, FOLATE, FERRITIN, TIBC, IRON, RETICCTPCT in the last 72 hours. Sepsis Labs: No results for input(s): PROCALCITON, LATICACIDVEN in the last 168 hours.   No results found for this or any previous visit (from the past 240 hour(s)).        Radiology Studies: No results found.      Scheduled Meds:  amLODipine  2.5 mg Oral Daily   arformoterol  15 mcg Nebulization BID   budesonide (PULMICORT) nebulizer solution  0.5 mg Nebulization Q12H   chlorhexidine  15 mL Mouth Rinse BID   enoxaparin (LOVENOX) injection  40 mg Subcutaneous Q24H   furosemide  40 mg Intravenous Daily   guaiFENesin  600 mg Oral BID   levothyroxine  50 mcg Oral QAC breakfast   mouth rinse  15 mL Mouth Rinse q12n4p   pantoprazole  40 mg Oral Daily   [START ON 12/30/2021] predniSONE  40 mg Oral Q breakfast   Followed by   Derrill Memo ON 01/01/2022] predniSONE  30 mg Oral Q breakfast   Followed by   Derrill Memo ON 01/03/2022] predniSONE  20 mg Oral Q breakfast   Followed by   Derrill Memo ON 01/05/2022] predniSONE  10 mg Oral Q breakfast   revefenacin  175 mcg Nebulization Daily   senna-docusate  1 tablet Oral BID   sodium chloride  1 g Oral BID WC   traZODone  50 mg Oral QHS   Continuous Infusions:  sodium chloride Stopped (12/21/21 1706)     LOS: 14 days    Time spent: 38 minutes spent on chart review, discussion with nursing staff, consultants, updating family and interview/physical exam; more than 50% of that time was spent in counseling and/or coordination of care.    Isaack Preble J [place] DO [facility] Available via Epic secure chat  7am-7pm After these hours, please refer to coverage provider listed on [email or web address] 12/29/2021, 9:45 AM

## 2021-12-29 NOTE — Plan of Care (Signed)
°  Problem: Activity: Goal: Ability to tolerate increased activity will improve Outcome: Progressing Goal: Will verbalize the importance of balancing activity with adequate rest periods Outcome: Progressing   Problem: Respiratory: Goal: Levels of oxygenation will improve Outcome: Progressing   Problem: Education: Goal: Knowledge of General Education information will improve Description: Including pain rating scale, medication(s)/side effects and non-pharmacologic comfort measures Outcome: Progressing

## 2021-12-29 NOTE — Progress Notes (Signed)
RT went to check on pt. Pt was off of bipap. RN states pt requested to be off. No resp distress noted. Pt is resting comfortably. Machine remained bedside.

## 2021-12-30 ENCOUNTER — Inpatient Hospital Stay: Payer: Medicare PPO | Admitting: Adult Health

## 2021-12-30 DIAGNOSIS — J209 Acute bronchitis, unspecified: Secondary | ICD-10-CM | POA: Diagnosis not present

## 2021-12-30 DIAGNOSIS — K5900 Constipation, unspecified: Secondary | ICD-10-CM | POA: Diagnosis not present

## 2021-12-30 DIAGNOSIS — E039 Hypothyroidism, unspecified: Secondary | ICD-10-CM | POA: Diagnosis present

## 2021-12-30 DIAGNOSIS — C3490 Malignant neoplasm of unspecified part of unspecified bronchus or lung: Secondary | ICD-10-CM | POA: Diagnosis not present

## 2021-12-30 DIAGNOSIS — J9601 Acute respiratory failure with hypoxia: Secondary | ICD-10-CM | POA: Diagnosis not present

## 2021-12-30 DIAGNOSIS — K219 Gastro-esophageal reflux disease without esophagitis: Secondary | ICD-10-CM | POA: Diagnosis not present

## 2021-12-30 DIAGNOSIS — I5030 Unspecified diastolic (congestive) heart failure: Secondary | ICD-10-CM | POA: Diagnosis not present

## 2021-12-30 DIAGNOSIS — R0602 Shortness of breath: Secondary | ICD-10-CM | POA: Diagnosis not present

## 2021-12-30 DIAGNOSIS — M2550 Pain in unspecified joint: Secondary | ICD-10-CM | POA: Diagnosis not present

## 2021-12-30 DIAGNOSIS — F419 Anxiety disorder, unspecified: Secondary | ICD-10-CM | POA: Diagnosis present

## 2021-12-30 DIAGNOSIS — E871 Hypo-osmolality and hyponatremia: Secondary | ICD-10-CM | POA: Diagnosis not present

## 2021-12-30 DIAGNOSIS — J449 Chronic obstructive pulmonary disease, unspecified: Secondary | ICD-10-CM | POA: Diagnosis not present

## 2021-12-30 DIAGNOSIS — C50412 Malignant neoplasm of upper-outer quadrant of left female breast: Secondary | ICD-10-CM | POA: Diagnosis not present

## 2021-12-30 DIAGNOSIS — Z7401 Bed confinement status: Secondary | ICD-10-CM | POA: Diagnosis not present

## 2021-12-30 DIAGNOSIS — J440 Chronic obstructive pulmonary disease with acute lower respiratory infection: Secondary | ICD-10-CM | POA: Diagnosis not present

## 2021-12-30 DIAGNOSIS — Z79899 Other long term (current) drug therapy: Secondary | ICD-10-CM | POA: Diagnosis not present

## 2021-12-30 DIAGNOSIS — K5901 Slow transit constipation: Secondary | ICD-10-CM | POA: Insufficient documentation

## 2021-12-30 DIAGNOSIS — R140 Abdominal distension (gaseous): Secondary | ICD-10-CM | POA: Diagnosis not present

## 2021-12-30 DIAGNOSIS — C7A8 Other malignant neuroendocrine tumors: Secondary | ICD-10-CM | POA: Diagnosis not present

## 2021-12-30 DIAGNOSIS — I10 Essential (primary) hypertension: Secondary | ICD-10-CM | POA: Diagnosis not present

## 2021-12-30 DIAGNOSIS — Z853 Personal history of malignant neoplasm of breast: Secondary | ICD-10-CM | POA: Diagnosis not present

## 2021-12-30 DIAGNOSIS — E559 Vitamin D deficiency, unspecified: Secondary | ICD-10-CM | POA: Diagnosis not present

## 2021-12-30 DIAGNOSIS — E878 Other disorders of electrolyte and fluid balance, not elsewhere classified: Secondary | ICD-10-CM | POA: Diagnosis not present

## 2021-12-30 DIAGNOSIS — J479 Bronchiectasis, uncomplicated: Secondary | ICD-10-CM | POA: Insufficient documentation

## 2021-12-30 DIAGNOSIS — I110 Hypertensive heart disease with heart failure: Secondary | ICD-10-CM | POA: Diagnosis not present

## 2021-12-30 DIAGNOSIS — G4700 Insomnia, unspecified: Secondary | ICD-10-CM | POA: Insufficient documentation

## 2021-12-30 DIAGNOSIS — J441 Chronic obstructive pulmonary disease with (acute) exacerbation: Secondary | ICD-10-CM | POA: Diagnosis not present

## 2021-12-30 DIAGNOSIS — J9602 Acute respiratory failure with hypercapnia: Secondary | ICD-10-CM | POA: Diagnosis not present

## 2021-12-30 MED ORDER — SENNOSIDES-DOCUSATE SODIUM 8.6-50 MG PO TABS: 1.0000 | ORAL_TABLET | Freq: Two times a day (BID) | ORAL | Status: AC

## 2021-12-30 MED ORDER — PREDNISONE 10 MG PO TABS: ORAL_TABLET | ORAL | 0 refills | Status: AC

## 2021-12-30 MED ORDER — TRAZODONE HCL 50 MG PO TABS: 50.0000 mg | ORAL_TABLET | Freq: Every day | ORAL | 0 refills | Status: DC

## 2021-12-30 MED ORDER — POLYETHYLENE GLYCOL 3350 17 G PO PACK: 17.0000 g | PACK | Freq: Every day | ORAL | 0 refills | Status: DC | PRN

## 2021-12-30 MED ORDER — SODIUM CHLORIDE 1 G PO TABS: 1.0000 g | ORAL_TABLET | Freq: Two times a day (BID) | ORAL | Status: DC

## 2021-12-30 MED ORDER — TRELEGY ELLIPTA 200-62.5-25 MCG/ACT IN AEPB
1.0000 | INHALATION_SPRAY | Freq: Every day | RESPIRATORY_TRACT | 3 refills | Status: DC
Start: 2021-12-30 — End: 2022-01-21

## 2021-12-30 MED ORDER — GUAIFENESIN-DM 100-10 MG/5ML PO SYRP: 5.0000 mL | ORAL_SOLUTION | ORAL | 0 refills | Status: DC | PRN

## 2021-12-30 MED ORDER — PANTOPRAZOLE SODIUM 40 MG PO TBEC: 40.0000 mg | DELAYED_RELEASE_TABLET | Freq: Every day | ORAL | Status: DC

## 2021-12-30 MED ORDER — ALPRAZOLAM 0.25 MG PO TABS: 0.2500 mg | ORAL_TABLET | Freq: Every evening | ORAL | 0 refills | Status: DC | PRN

## 2021-12-30 NOTE — Care Management Important Message (Signed)
Important Message  Patient Details IM Letter placed in Patients room. Name: Victoria Rice MRN: [id number] Date of Birth: Mar 07, 1931   Medicare Important Message Given:  Yes     Kerin Salen 12/30/2021, 12:07 PM

## 2021-12-30 NOTE — TOC Transition Note (Signed)
Transition of Care [facility]) - CM/SW Discharge Note   Patient Details  Name: Victoria Rice MRN: [id number] Date of Birth: 06-10-1931  [facility] CM/SW Contact:  Leeroy Cha, RN Phone Number: 12/30/2021, 3:11 PM   Clinical Narrative:    Packet for transport to [facility] [place] at the nursing station,  [facility] called for transport at 1512   Final next level of care: Skilled Nursing Facility Barriers to Discharge: No Barriers Identified   Patient Goals and CMS Choice Patient states their goals for this hospitalization and ongoing recovery are:: To get better CMS Medicare.gov Compare Post Acute Care list provided to:: Patient Choice offered to / list presented to : Patient  Discharge Placement                       Discharge Plan and Services                                     Social Determinants of Health (SDOH) Interventions     Readmission Risk Interventions No flowsheet data found.

## 2021-12-30 NOTE — Progress Notes (Signed)
PROGRESS NOTE    TEIGEN PARSLOW  ZOX:[id number] DOB: October 15, 1931 DOA: 12/15/2021 PCP: Faustino Congress, NP    Brief Narrative:  Victoria Rice is an 88 year old female with past medical history significant for left lower lobe carcinoid tumor, GOLD 3 COPD, breast cancer s/p lumpectomy 2017, essential hypertension, who presented to [facility] ED on 1/8 with complaints of shortness of breath.  Family reports progression in shortness of breath, no new medications.  On EMS arrival, patient was wheezing and given albuterol, Solu-Medrol placed on 3 L nasal cannula.  Recently seen in oncology office outpatient, was short of breath but declined further work-up at that time.  Has been placed on Norvasc for blood pressure that caused lower extremity edema which was discontinued and subsequent placed on Lasix.  In the ED, BP 104/77, HR 92, RR 22, SPO2 99% on BiPAP.  VBG with pH 7.28, PCO2 80.8, PO2 36.5.  Sodium 121, potassium 3.5, chloride 76, CO2 34, glucose 110, BUN 9, creatinine 0.67.  High sensitive troponin 4.  BNP 52.7.  WBC 13.3, hemoglobin 16.0, platelets 312.  COVID-19 PCR negative.  Influenza A/B PCR negative.  Chest x-ray with no acute cardiopulmonary disease process.  EDP started IV fluid hydration, albuterol neb and placed on BiPAP.  Hospital service consulted for further evaluation management of acute respite failure secondary to COPD exacerbation.   Assessment & Plan:   Principal Problem:   Acute bronchitis with COPD ([place]) Active Problems:   Breast cancer of upper-outer quadrant of left female breast ([place])   COPD, severe ([place])   Neuroendocrine carcinoma of lung ([place])   Malignant neoplasm of unspecified part of unspecified bronchus or lung ([place])   Acute hypovolemic hyponatremia   Acute respiratory failure with hypercapnia (HCC)   Acute hypoxic and hypercapnic respiratory failure COPD exacerbation Bronchiectasis Patient presenting to the ED with progressive shortness of breath,  found to have elevated CO2 level on VBG.  Suspect chronic hypercapnia from severe COPD.  Completed 4-day course of antibiotics with cefepime followed by cefdinir. --Pulmonology now signed off 1/20 --Brovana neb twice daily --Pulmicort neb twice daily --Yupelri neb daily --Solu-Medrol 40 mg IV q24h; plan to transition to prednisone taper tomorrow --Mucinex 600 mg p.o. twice daily --Albuterol neb every 4 hours.  Wheezing/shortness of breath --BiPAP nightly and during naps; will need at discharge due to chronic hypercapnia life-threatening respiratory failure --On discharge, pulmonology recommends Bevespi BID or Trelegy daily (If triple inhalers are not covered, any combination of LAMA, LABA, ICS is appropriate).  Acute metabolic encephalopathy, POA Patient presenting with shortness of breath, confusion found to have an elevated PCO2 likely from underlying COPD. --Continue treatment as above --Minimize sedating meds as this contributes to hypercarbia with avoidance of benzos/opioids if able  Hyponatremia Hypochloremia Etiology likely secondary to dehydration in setting of poor oral intake.  Sodium on admission 121 with chloride 76.  Serum osmolality 251, urine osmolality 350, urine sodium 24.  Improved with IV fluid hydration.  Sodium up to 136. --Sodium chloride 1 g p.o. twice daily --Continue to encourage increased oral intake  Left lower lobe carcinoid tumor Previously followed by Dr. Roxan Hockey with thoracic surgery, patient opted to forego any further follow-up on 09/10/2021 due to poor surgical candidate.  Anxiety: Xanax 0.25 mg qHS PRN  Essential hypertension --Amlodipine 2.5 mg p.o. daily --Metoprolol tartrate --Hydralazine 10 mg IV q4h PRN SBP >811  Chronic diastolic congestive heart failure TTE 12/21/2021 with LVEF 60 to 91%, grade 1 diastolic dysfunction, RVSP 37.8, IVC normal  in size.  Repeat chest x-ray 12/25/2021 with persistent bibasilar effusions/atelectasis greater on  left.  --Furosemide 40 mg IV every 24 hours --Strict I's and O's and daily weights --Repeat BMP in a.m.  Hypothyroidism TSH 0.445 on 12/16/2021, within normal limits. --Levothyroxine 50 mcg p.o. daily  Insomnia: Trazodone 50 mg p.o. nightly  GERD: Protonix 40 mg p.o. daily  Weakness/deconditioning/debility: --TOC for SNF placement --Continue therapy efforts while inpatient   DVT prophylaxis: enoxaparin (LOVENOX) injection 40 mg Start: 12/16/21 2200   Code Status: DNR Family Communication: No family present at bedside this morning  Disposition Plan:  Level of care: Progressive Status is: Inpatient  Remains inpatient appropriate because: Awaiting for TOC to ensure BiPAP at SNF, possible discharge later today     Consultants:  PCCM - signed off 1/20 Palliative care  Procedures:  TTE  Antimicrobials:  Cefepime 1/8 - 1/9 Cefdinir 1/9 - 1/12     Subjective: Patient seen examined bedside, resting comfortably.  Lying in bed.  Reports good sleep overnight with BiPAP.  Currently receiving neb treatment from RT.  Discussed with TOC this morning, awaiting to ensure BiPAP delivered to SNF and possible discharge later this afternoon.  No other questions or concerns at this time. Denies headache, no dizziness, no chest pain, no palpitations, no abdominal pain, no fever/chills/night sweats, no nausea/vomiting/diarrhea, no paresthesias.  No acute events overnight per nursing staff.  Objective: Vitals:   12/29/21 2245 12/30/21 0032 12/30/21 0458 12/30/21 0800  BP:   124/73   Pulse: 73 63 67   Resp: 20 20 20    Temp:   98 F (36.7 C)   TempSrc:   Oral   SpO2: 94% 95% 97% 96%  Weight:      Height:        Intake/Output Summary (Last 24 hours) at 12/30/2021 1236 Last data filed at 12/30/2021 0830 Gross per 24 hour  Intake 480 ml  Output --  Net 480 ml   Filed Weights   12/24/21 0646 12/28/21 0516 12/29/21 0513  Weight: 50 kg 46.5 kg 46.8 kg    Examination:  General  exam: Appears calm and comfortable, chronically ill/elderly appearance Respiratory system: Breath sounds slight decreased bilateral bases, mild late expiratory wheezing, no crackles, normal respiratory effort, on 2L [place] at rest with SPO2 97%. Cardiovascular system: S1 & S2 heard, RRR. No JVD, murmurs, rubs, gallops or clicks. No pedal edema. Gastrointestinal system: Abdomen is nondistended, soft and nontender. No organomegaly or masses felt. Normal bowel sounds heard. Central nervous system: Alert and oriented. No focal neurological deficits. Extremities: Symmetric 5 x 5 power. Skin: No rashes, lesions or ulcers Psychiatry: Judgement and insight appear normal. Mood & affect appropriate.     Data Reviewed: I have personally reviewed following labs and imaging studies  CBC: Recent Labs  Lab 12/24/21 0347 12/26/21 0328  WBC 9.2 10.9*  HGB 13.3 14.0  HCT 44.7 46.6*  MCV 97.8 98.1  PLT 156 213*   Basic Metabolic Panel: Recent Labs  Lab 12/24/21 0347 12/26/21 0328 12/28/21 0600 12/29/21 0358  NA 134* 135 135 136  K 4.8 3.8 3.5 3.8  CL 88* 82* 84* 84*  CO2 43* >45* >45* >45*  GLUCOSE 122* 141* 97 99  BUN 26* 25* 26* 26*  CREATININE 0.46 0.54 0.70 0.67  CALCIUM 8.5* 8.6* 8.3* 8.3*  MG  --   --   --  2.1   GFR: Estimated Creatinine Clearance: 34.9 mL/min (by C-G formula based on SCr of 0.67 mg/dL).  Liver Function Tests: Recent Labs  Lab 12/24/21 0347  AST 12*  ALT 18  ALKPHOS 41  BILITOT 0.8  PROT 4.8*  ALBUMIN 2.8*   No results for input(s): LIPASE, AMYLASE in the last 168 hours. No results for input(s): AMMONIA in the last 168 hours. Coagulation Profile: No results for input(s): INR, PROTIME in the last 168 hours. Cardiac Enzymes: No results for input(s): CKTOTAL, CKMB, CKMBINDEX, TROPONINI in the last 168 hours.  BNP (last 3 results) No results for input(s): PROBNP in the last 8760 hours. HbA1C: No results for input(s): HGBA1C in the last 72 hours. CBG: No  results for input(s): GLUCAP in the last 168 hours. Lipid Profile: No results for input(s): CHOL, HDL, LDLCALC, TRIG, CHOLHDL, LDLDIRECT in the last 72 hours. Thyroid Function Tests: No results for input(s): TSH, T4TOTAL, FREET4, T3FREE, THYROIDAB in the last 72 hours. Anemia Panel: No results for input(s): VITAMINB12, FOLATE, FERRITIN, TIBC, IRON, RETICCTPCT in the last 72 hours. Sepsis Labs: No results for input(s): PROCALCITON, LATICACIDVEN in the last 168 hours.   No results found for this or any previous visit (from the past 240 hour(s)).        Radiology Studies: No results found.      Scheduled Meds:  amLODipine  2.5 mg Oral Daily   arformoterol  15 mcg Nebulization BID   budesonide (PULMICORT) nebulizer solution  0.5 mg Nebulization Q12H   chlorhexidine  15 mL Mouth Rinse BID   enoxaparin (LOVENOX) injection  40 mg Subcutaneous Q24H   furosemide  40 mg Intravenous Daily   guaiFENesin  600 mg Oral BID   levothyroxine  50 mcg Oral QAC breakfast   mouth rinse  15 mL Mouth Rinse q12n4p   pantoprazole  40 mg Oral Daily   predniSONE  40 mg Oral Q breakfast   Followed by   Derrill Memo ON 01/01/2022] predniSONE  30 mg Oral Q breakfast   Followed by   Derrill Memo ON 01/03/2022] predniSONE  20 mg Oral Q breakfast   Followed by   Derrill Memo ON 01/05/2022] predniSONE  10 mg Oral Q breakfast   revefenacin  175 mcg Nebulization Daily   senna-docusate  1 tablet Oral BID   sodium chloride  1 g Oral BID WC   traZODone  50 mg Oral QHS   Continuous Infusions:  sodium chloride Stopped (12/21/21 1706)     LOS: 15 days    Time spent: 36 minutes spent on chart review, discussion with nursing staff, consultants, updating family and interview/physical exam; more than 50% of that time was spent in counseling and/or coordination of care.    Jahira Swiss J [place] DO [facility] Available via Epic secure chat 7am-7pm After these hours, please refer to coverage provider listed on  [email or web address] 12/30/2021, 12:36 PM

## 2021-12-30 NOTE — Progress Notes (Signed)
Pt requested to be off bipap.  RN took pt off and placed back on 2L [place].  Pt stated that she rested very well last night.  Only c/o bridge of her nose hurting some

## 2021-12-30 NOTE — Progress Notes (Addendum)
Physical Therapy Treatment Patient Details Name: INFANT DOANE MRN: [id number] DOB: 1931-08-22 Today's Date: 12/30/2021   History of Present Illness 88 yo female admitted with acute resp failure 2* bronchiectasis, COPD exac. Hx of severe COPD    PT Comments    Pt OOB in recliner on 2 lts nasal at rest 96% with Son and Son In [place] in room.  RN assisted to [facility] as both family members stepped out.  Assisted off BSC to amb in hallway while monitoring vitals.  General transfer comment: 25% VC's on proper hand placement and safety with turns.  Assisted off BSC to recliner with increased time.  Unsteady.  Assisted with peri care as pt was unable due to balance instability.  At end of session, assisted with toilet transfer as well.  Same assist level. General Gait Details: tolerated an increased distance however required + 2 assist such that recliner was following and monitoring vitals.  Trial RA with amb sats decreased 85% and HR 87.  Required several standing rest breaks and VC's on proper purse lip breathing (COPD).  Pt required 2 lts to sustain sats > 90%.  SATURATION QUALIFICATIONS: (This note is used to comply with regulatory documentation for home oxygen)  Patient Saturations on Room Air at Rest = 89%  Patient Saturations on Room Air while Ambulating 42 feet = 85%  Patient Saturations on 2 Liters of oxygen while Ambulating = 90%  Please briefly explain why patient needs home oxygen:  pt requires supplemental oxygen to achieve therapeutic levels.    Pt lives home alone and will need ST Rehab at SNF prior to safe return   Recommendations for follow up therapy are one component of a multi-disciplinary discharge planning process, led by the attending physician.  Recommendations may be updated based on patient status, additional functional criteria and insurance authorization.  Follow Up Recommendations  Skilled nursing-short term rehab (<3 hours/day)     Assistance Recommended at  Discharge Frequent or constant Supervision/Assistance  Patient can return home with the following A little help with walking and/or transfers;A little help with bathing/dressing/bathroom;Assistance with cooking/housework;Direct supervision/assist for medications management;Help with stairs or ramp for entrance   Equipment Recommendations  None recommended by PT    Recommendations for Other Services       Precautions / Restrictions Precautions Precautions: Fall Precaution Comments: monitor vitals     Mobility  Bed Mobility               General bed mobility comments: OOB in recliner    Transfers Overall transfer level: Needs assistance Equipment used: Rolling walker (2 wheels) Transfers: Sit to/from Stand Sit to Stand: Min assist           General transfer comment: 25% VC's on proper hand placement and safety with turns.  Assisted off BSC to recliner with increased time.  Unsteady.  Assisted with peri care as pt was unable due to balance instability.  At end of session, assisted with toilet transfer as well.  Same assist level.    Ambulation/Gait Ambulation/Gait assistance: Min assist, Mod assist, +2 safety/equipment Gait Distance (Feet): 44 Feet Assistive device: Rolling walker (2 wheels) Gait Pattern/deviations: Step-through pattern, Decreased stride length Gait velocity: decreased     General Gait Details: tolerated an increased distance however required + 2 assist such that recliner was following and monitoring vitals.  Trial RA with amb sats decreased 85% and HR 87.  Required several standing rest breaks and VC's on proper purse lip breathing (COPD).  Pt required 2 lts to sustain sats > 90%.   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        Pertinent Vitals/Pain Pain Assessment Pain Assessment: No/denies pain    Home Living                          Prior Function            PT Goals (current goals can now be found in the care plan section) Progress towards PT goals: Progressing toward goals    Frequency    Min 2X/week      PT Plan Current plan remains appropriate    Co-evaluation              AM-PAC PT "6 Clicks" Mobility   Outcome Measure  Help needed turning from your back to your side while in a flat bed without using bedrails?: A Little Help needed moving from lying on your back to sitting on the side of a flat bed without using bedrails?: A Little Help needed moving to and from a bed to a chair (including a wheelchair)?: A Little Help needed standing up from a chair using your arms (e.g., wheelchair or bedside chair)?: A Lot Help needed to walk in hospital room?: A Lot Help needed climbing 3-5 steps with a railing? : Total 6 Click Score: 14    End of Session Equipment Utilized During Treatment: Gait belt;Oxygen   Patient left: in chair;with call bell/phone within reach;with family/visitor present Nurse Communication: Mobility status PT Visit Diagnosis: Muscle weakness (generalized) (M62.81);Difficulty in walking, not elsewhere classified (R26.2)     Time: 7672-0947 PT Time Calculation (min) (ACUTE ONLY): 34 min  Charges:  $Gait Training: 8-22 mins $Therapeutic Activity: 8-22 mins                     {Kaho Selle  PTA Acute  Rehabilitation Services Pager      [phone] Office      [phone]

## 2021-12-30 NOTE — Discharge Summary (Signed)
Physician Discharge Summary  Victoria Rice:[id number] DOB: [birth year] DOA: 12/15/2021  PCP: Faustino Congress, NP  Admit date: 12/15/2021 Discharge date: 12/30/2021  Admitted From: Home Disposition:  [facility]  Recommendations for Outpatient Follow-up:  Follow up with PCP in 1-2 weeks Follow-up with [facility] pulmonology in 1-2 week after discharge Outpatient follow with cardiology as scheduled Continue prednisone taper Ensure utilizes BiPAP nightly and when napping to reduce high CO2 levels; settings rate 20, inspiratory pressure 18, expiratory pressure 6, FiO2 30%.  Equipment:  --BiPAP, rate 20, inspiratory pressure 18, expiratory pressure 6, FiO2 30%. --Oxygen 2 L per nasal cannula   Discharge Condition: Stable CODE STATUS: DNR Diet recommendation: Heart healthy diet  History of present illness:  Victoria Rice is an 88 year old female with past medical history significant for left lower lobe carcinoid tumor, GOLD 3 COPD, breast cancer s/p lumpectomy 2017, essential hypertension, who presented to [facility] ED on 1/8 with complaints of shortness of breath.  Family reports progression in shortness of breath, no new medications.  On EMS arrival, patient was wheezing and given albuterol, Solu-Medrol placed on 3 L nasal cannula.   Recently seen in oncology office outpatient, was short of breath but declined further work-up at that time.  Has been placed on Norvasc for blood pressure that caused lower extremity edema which was discontinued and subsequent placed on Lasix.   In the ED, BP 104/77, HR 92, RR 22, SPO2 99% on BiPAP.  VBG with pH 7.28, PCO2 80.8, PO2 36.5.  Sodium 121, potassium 3.5, chloride 76, CO2 34, glucose 110, BUN 9, creatinine 0.67.  High sensitive troponin 4.  BNP 52.7.  WBC 13.3, hemoglobin 16.0, platelets 312.  COVID-19 PCR negative.  Influenza A/B PCR negative.  Chest x-ray with no acute cardiopulmonary disease process.  EDP started IV fluid hydration, albuterol  neb and placed on BiPAP.  Hospital service consulted for further evaluation management of acute respiratory failure secondary to COPD exacerbation.  Hospital course:  Acute hypoxic and hypercapnic respiratory failure COPD exacerbation Bronchiectasis Patient presenting to the ED with progressive shortness of breath, found to have elevated CO2 level on VBG.  Suspect chronic hypercapnia from severe COPD.  Pulmonology was consulted and followed during hospital course.  Completed 4-day course of antibiotics with cefepime followed by cefdinir.  Was supported with steroid neb treatments, IV steroids which are now transitioned to a prednisone taper.  Continue albuterol MDI/neb as needed. BiPAP nightly and during naps to avoid further acute on chronic hypercapnia life-threatening respiratory failure with settings rate 20, inspiratory pressure 18, expiratory pressure 6, FiO2 30%. On discharge, pulmonology recommends Bevespi BID or Trelegy daily (If triple inhalers are not covered, any combination of LAMA, LABA, ICS is appropriate).  Outpatient follow-up with pulmonology in 1-2 weeks.   Acute metabolic encephalopathy, POA Patient presenting with shortness of breath, confusion found to have an elevated PCO2 likely from underlying COPD. Minimize sedating meds as this contributes to hypercarbia with avoidance of benzos/opioids if able   Hyponatremia Hypochloremia Etiology likely secondary to dehydration in setting of poor oral intake.  Sodium on admission 121 with chloride 76.  Serum osmolality 251, urine osmolality 350, urine sodium 24.  Improved with IV fluid hydration.  Sodium up to 136. Sodium chloride 1 g p.o. twice daily.    Left lower lobe carcinoid tumor Previously followed by Dr. Roxan Hockey with thoracic surgery, patient opted to forego any further follow-up on 09/10/2021 due to poor surgical candidate.   Anxiety: Xanax 0.25 mg qHS  PRN   Essential hypertension Amlodipine 2.5 mg p.o. daily,  Metoprolol tartrate   Chronic diastolic congestive heart failure TTE 12/21/2021 with LVEF 60 to 83%, grade 1 diastolic dysfunction, RVSP 37.8, IVC normal in size.  Repeat chest x-ray 12/25/2021 with persistent bibasilar effusions/atelectasis greater on left.  Patient was supported with IV furosemide during hospitalization with good urine output.  Continue furosemide 40 mg p.o. daily.  Recommend daily weight monitoring.   Hypothyroidism TSH 0.445 on 12/16/2021, within normal limits. Levothyroxine 50 mcg p.o. daily   Insomnia: Trazodone 50 mg p.o. nightly   GERD: Protonix 40 mg p.o. daily   Weakness/deconditioning/debility: Discharging to SNF for further rehabilitation.  Discharge Diagnoses:  Principal Problem:   Acute bronchitis with COPD ([place]) Active Problems:   Breast cancer of upper-outer quadrant of left female breast (HCC)   COPD, severe (HCC)   Neuroendocrine carcinoma of lung ([place])   Malignant neoplasm of unspecified part of unspecified bronchus or lung ([place])   Acute hypovolemic hyponatremia   Acute respiratory failure with hypercapnia [facility])    Discharge Instructions  Discharge Instructions     Call MD for:  difficulty breathing, headache or visual disturbances   Complete by: As directed    Call MD for:  extreme fatigue   Complete by: As directed    Call MD for:  persistant dizziness or light-headedness   Complete by: As directed    Call MD for:  persistant nausea and vomiting   Complete by: As directed    Call MD for:  severe uncontrolled pain   Complete by: As directed    Call MD for:  temperature >100.4   Complete by: As directed    Diet - low sodium heart healthy   Complete by: As directed    Increase activity slowly   Complete by: As directed       Allergies as of 12/30/2021       Reactions   Actonel [risedronate Sodium] Other (See Comments)   indigestion        Medication List     STOP taking these medications    Combivent Respimat 20-100  MCG/ACT Aers respimat Generic drug: Ipratropium-Albuterol   Spiriva Respimat 2.5 MCG/ACT Aers Generic drug: Tiotropium Bromide Monohydrate       TAKE these medications    albuterol 108 (90 Base) MCG/ACT inhaler Commonly known as: VENTOLIN HFA Inhale 1-2 puffs into the lungs every 6 (six) hours as needed for wheezing or shortness of breath.   ALPRAZolam 0.25 MG tablet Commonly known as: XANAX Take 1 tablet (0.25 mg total) by mouth at bedtime as needed for anxiety.   amLODipine 2.5 MG tablet Commonly known as: NORVASC Take 2.5 mg by mouth daily.   cholecalciferol 1000 units tablet Commonly known as: VITAMIN D Take 1,000 Units by mouth daily.   furosemide 40 MG tablet Commonly known as: LASIX Take 40 mg by mouth daily.   guaiFENesin-dextromethorphan 100-10 MG/5ML syrup Commonly known as: ROBITUSSIN DM Take 5 mLs by mouth every 4 (four) hours as needed for cough (chest congestion).   levothyroxine 50 MCG tablet Commonly known as: SYNTHROID Take 50 mcg by mouth daily before breakfast.   naproxen sodium 220 MG tablet Commonly known as: ALEVE Take 220 mg by mouth 2 (two) times daily as needed (for pain.).   pantoprazole 40 MG tablet Commonly known as: PROTONIX Take 1 tablet (40 mg total) by mouth daily. Start taking on: December 31, 2021   polyethylene glycol 17 g packet Commonly known  as: MIRALAX / GLYCOLAX Take 17 g by mouth daily as needed for mild constipation.   predniSONE 10 MG tablet Commonly known as: DELTASONE Take 4 tablets (40 mg total) by mouth daily for 1 day, THEN 3 tablets (30 mg total) daily for 2 days, THEN 2 tablets (20 mg total) daily for 2 days, THEN 1 tablet (10 mg total) daily for 2 days. Start taking on: December 31, 2021   senna-docusate 8.6-50 MG tablet Commonly known as: Senokot-S Take 1 tablet by mouth 2 (two) times daily.   sodium chloride 1 g tablet Take 1 tablet (1 g total) by mouth 2 (two) times daily with a meal.   traZODone 50 MG  tablet Commonly known as: DESYREL Take 1 tablet (50 mg total) by mouth at bedtime.   Trelegy Ellipta 200-62.5-25 MCG/ACT Aepb Generic drug: Fluticasone-Umeclidin-Vilant Inhale 1 puff into the lungs daily.        Follow-up Information     Parrett, Fonnie Mu, NP Follow up on 12/30/2021.   Specialty: Pulmonary Disease Why: 2 pm Contact information: [address] [place] [address] [phone]         Faustino Congress, NP. Schedule an appointment as soon as possible for a visit in 1 week(s).   Specialty: Family Medicine Contact information: [place] [address] [phone]                Allergies  Allergen Reactions   Actonel [Risedronate Sodium] Other (See Comments)    indigestion    Consultations: Pulmonary critical care medicine Palliative care   Procedures/Studies: DG Abd 1 View  Result Date: 12/19/2021 CLINICAL DATA:  Abdominal pain. EXAM: ABDOMEN - 1 VIEW COMPARISON:  None. FINDINGS: The bowel gas pattern is normal. No radio-opaque calculi or other significant radiographic abnormality are seen. IMPRESSION: Negative. Electronically Signed   By: Marijo Conception M.D.   On: 12/19/2021 14:38   DG CHEST PORT 1 VIEW  Result Date: 12/25/2021 CLINICAL DATA:  Pleural effusion EXAM: PORTABLE CHEST 1 VIEW COMPARISON:  Portable exam 0504 hours compared to 12/21/2021 FINDINGS: Normal heart size, mediastinal contours, and pulmonary vascularity. Atherosclerotic calcification aorta. Small bibasilar pleural effusions and basilar atelectasis slightly greater on LEFT. Upper lungs clear. No new infiltrate or pneumothorax. Bones demineralized. IMPRESSION: Persistent bibasilar effusions and atelectasis greater on LEFT. Aortic Atherosclerosis (ICD10-I70.0). Electronically Signed   By: Lavonia Dana M.D.   On: 12/25/2021 08:20   DG CHEST PORT 1 VIEW  Result Date: 12/21/2021 CLINICAL DATA:  Tachypnea, COPD EXAM: PORTABLE CHEST 1 VIEW  COMPARISON:  12/19/2021 chest radiograph. FINDINGS: Clips overlie the lower chest bilaterally. Stable cardiomediastinal silhouette with top-normal heart size. No pneumothorax. Small bilateral pleural effusions, stable. No overt pulmonary edema. Left lower lobe atelectasis, unchanged. IMPRESSION: Stable small bilateral pleural effusions and left lower lobe atelectasis. Electronically Signed   By: Ilona Sorrel M.D.   On: 12/21/2021 09:32   DG CHEST PORT 1 VIEW  Result Date: 12/19/2021 CLINICAL DATA:  Respiratory compromise EXAM: PORTABLE CHEST 1 VIEW COMPARISON:  Four days ago FINDINGS: Hazy bilateral lower chest opacification which is new/progressed. Bilateral pleural fluid. No pneumothorax. Normal heart size and mediastinal contours. IMPRESSION: New or progressive opacity of the lower chest with pleural fluid and either atelectasis or edema. Electronically Signed   By: Jorje Guild M.D.   On: 12/19/2021 07:49   DG Chest Port 1 View  Result Date: 12/15/2021 CLINICAL DATA:  Dyspnea EXAM: PORTABLE CHEST 1 VIEW COMPARISON:  Chest radiograph dated November 05, 2021 FINDINGS: The heart size and mediastinal contours are within normal limits. Atherosclerotic calcification of aortic arch. Both lungs are clear. The visualized skeletal structures are unremarkable. IMPRESSION: No acute cardiopulmonary process. Electronically Signed   By: Keane Police D.O.   On: 12/15/2021 14:07   ECHOCARDIOGRAM COMPLETE  Result Date: 12/21/2021    ECHOCARDIOGRAM REPORT   Patient Name:   Victoria Rice Date of Exam: 12/21/2021 Medical Rec #:  [id number]         Height:       60.0 in Accession #:    [id number]        Weight:       113.8 lb Date of Birth:  Apr 08, 1931        BSA:          1.469 m Patient Age:    10 years          BP:           100/41 mmHg Patient Gender: F                 HR:           78 bpm. Exam Location:  Inpatient Procedure: 2D Echo Indications:    Acute Respiratory Distress  History:        Patient has prior  history of Echocardiogram examinations, most                 recent 09/26/2019. COPD; Signs/Symptoms:Shortness of Breath.  Sonographer:    Arlyss Gandy Referring Phys: [place]  1. Left ventricular ejection fraction, by estimation, is 60 to 65%. The left ventricle has normal function. The left ventricle has no regional wall motion abnormalities. Left ventricular diastolic parameters are consistent with Grade I diastolic dysfunction (impaired relaxation).  2. Right ventricular systolic function is normal. The right ventricular size is dilated. There is mildly elevated pulmonary artery systolic pressure. The estimated right ventricular systolic pressure is 19.6 mmHg.  3. The mitral valve was not well visualized. No evidence of mitral valve regurgitation. No evidence of mitral stenosis.  4. The aortic valve was not well visualized. Aortic valve regurgitation is not visualized. No aortic stenosis is present.  5. The inferior vena cava is normal in size with <50% respiratory variability, suggesting right atrial pressure of 8 mmHg. Comparison(s): A prior study was performed on 09/26/2019. RVSP increased from prior. FINDINGS  Left Ventricle: Left ventricular ejection fraction, by estimation, is 60 to 65%. The left ventricle has normal function. The left ventricle has no regional wall motion abnormalities. The left ventricular internal cavity size was small. There is no left ventricular hypertrophy. Left ventricular diastolic parameters are consistent with Grade I diastolic dysfunction (impaired relaxation). Right Ventricle: The right ventricular size is moderately enlarged. No increase in right ventricular wall thickness. Right ventricular systolic function is normal. There is mildly elevated pulmonary artery systolic pressure. The tricuspid regurgitant velocity is 2.73 m/s, and with an assumed right atrial pressure of 15 mmHg, the estimated right ventricular systolic pressure is 22.2 mmHg. Left  Atrium: Left atrial size was normal in size. Right Atrium: Right atrial size was normal in size. Pericardium: There is no evidence of pericardial effusion. Mitral Valve: The mitral valve was not well visualized. No evidence of mitral valve regurgitation. No evidence of mitral valve stenosis. Tricuspid Valve: The tricuspid valve is normal in structure. Tricuspid valve regurgitation is trivial. No evidence of tricuspid stenosis. Aortic Valve: The aortic  valve was not well visualized. Aortic valve regurgitation is not visualized. No aortic stenosis is present. Aortic valve mean gradient measures 4.5 mmHg. Aortic valve peak gradient measures 8.3 mmHg. Aortic valve area, by VTI measures 2.65 cm. Pulmonic Valve: The pulmonic valve was not well visualized. Pulmonic valve regurgitation is not visualized. No evidence of pulmonic stenosis. Aorta: The aortic root and ascending aorta are structurally normal, with no evidence of dilitation. Venous: The inferior vena cava is normal in size with less than 50% respiratory variability, suggesting right atrial pressure of 8 mmHg. IAS/Shunts: No atrial level shunt detected by color flow Doppler.  LEFT VENTRICLE PLAX 2D LVIDd:         3.30 cm   Diastology LVIDs:         2.20 cm   LV e' medial:    7.72 cm/s LV PW:         0.70 cm   LV E/e' medial:  12.4 LV IVS:        0.70 cm   LV e' lateral:   10.10 cm/s LVOT diam:     1.90 cm   LV E/e' lateral: 9.5 LV SV:         68 LV SV Index:   47 LVOT Area:     2.84 cm  RIGHT VENTRICLE             IVC RV S prime:     12.80 cm/s  IVC diam: 2.20 cm TAPSE (M-mode): 2.3 cm LEFT ATRIUM             Index        RIGHT ATRIUM           Index LA diam:        2.50 cm 1.70 cm/m   RA Area:     11.80 cm LA Vol (A2C):   22.3 ml 15.18 ml/m  RA Volume:   26.00 ml  17.70 ml/m LA Vol (A4C):   27.4 ml 18.66 ml/m LA Biplane Vol: 25.1 ml 17.09 ml/m  AORTIC VALVE AV Area (Vmax):    2.61 cm AV Area (Vmean):   2.61 cm AV Area (VTI):     2.65 cm AV Vmax:            144.00 cm/s AV Vmean:          98.500 cm/s AV VTI:            0.258 m AV Peak Grad:      8.3 mmHg AV Mean Grad:      4.5 mmHg LVOT Vmax:         132.50 cm/s LVOT Vmean:        90.800 cm/s LVOT VTI:          0.242 m LVOT/AV VTI ratio: 0.93  AORTA Ao Root diam: 2.30 cm Ao Asc diam:  2.10 cm MITRAL VALVE               TRICUSPID VALVE MV Area (PHT): 4.63 cm    TR Peak grad:   29.8 mmHg MV Decel Time: 164 msec    TR Vmax:        273.00 cm/s MV E velocity: 95.50 cm/s MV A velocity: 99.40 cm/s  SHUNTS MV E/A ratio:  0.96        Systemic VTI:  0.24 m  Systemic Diam: 1.90 cm Rudean Haskell MD Electronically signed by Rudean Haskell MD Signature Date/Time: 12/21/2021/2:43:51 PM    Final      Subjective:   Discharge Exam: Vitals:   12/30/21 0458 12/30/21 0800  BP: 124/73   Pulse: 67   Resp: 20   Temp: 98 F (36.7 C)   SpO2: 97% 96%   Vitals:   12/29/21 2245 12/30/21 0032 12/30/21 0458 12/30/21 0800  BP:   124/73   Pulse: 73 63 67   Resp: 20 20 20    Temp:   98 F (36.7 C)   TempSrc:   Oral   SpO2: 94% 95% 97% 96%  Weight:      Height:        General: Pt is alert, awake, not in acute distress, elderly/chronically ill in appearance Cardiovascular: RRR, S1/S2 +, no rubs, no gallops Respiratory: Breath sounds slightly decreased [place] bases, mild late expiratory wheezing, no crackles, normal [place] effort on 2 L nasal cannula with SPO2 97% at rest Abdominal: Soft, NT, ND, bowel sounds + Extremities: no edema, no cyanosis    The results of significant diagnostics from this hospitalization (including imaging, microbiology, ancillary and laboratory) are listed below for reference.     Microbiology: No results found for this or any previous visit (from the past 240 hour(s)).   Labs: BNP (last 3 results) Recent Labs    12/18/21 0315 12/21/21 1335 12/25/21 0339  BNP 168.7* 164.8* 95.6   Basic Metabolic Panel: Recent Labs  Lab 12/24/21 0347  12/26/21 0328 12/28/21 0600 12/29/21 0358  NA 134* 135 135 136  K 4.8 3.8 3.5 3.8  CL 88* 82* 84* 84*  CO2 43* >45* >45* >45*  GLUCOSE 122* 141* 97 99  BUN 26* 25* 26* 26*  CREATININE 0.46 0.54 0.70 0.67  CALCIUM 8.5* 8.6* 8.3* 8.3*  MG  --   --   --  2.1   Liver Function Tests: Recent Labs  Lab 12/24/21 0347  AST 12*  ALT 18  ALKPHOS 41  BILITOT 0.8  PROT 4.8*  ALBUMIN 2.8*   No results for input(s): LIPASE, AMYLASE in the last 168 hours. No results for input(s): AMMONIA in the last 168 hours. CBC: Recent Labs  Lab 12/24/21 0347 12/26/21 0328  WBC 9.2 10.9*  HGB 13.3 14.0  HCT 44.7 46.6*  MCV 97.8 98.1  PLT 156 138*   Cardiac Enzymes: No results for input(s): CKTOTAL, CKMB, CKMBINDEX, TROPONINI in the last 168 hours. BNP: Invalid input(s): POCBNP CBG: No results for input(s): GLUCAP in the last 168 hours. D-Dimer No results for input(s): DDIMER in the last 72 hours. Hgb A1c No results for input(s): HGBA1C in the last 72 hours. Lipid Profile No results for input(s): CHOL, HDL, LDLCALC, TRIG, CHOLHDL, LDLDIRECT in the last 72 hours. Thyroid function studies No results for input(s): TSH, T4TOTAL, T3FREE, THYROIDAB in the last 72 hours.  Invalid input(s): FREET3 Anemia work up No results for input(s): VITAMINB12, FOLATE, FERRITIN, TIBC, IRON, RETICCTPCT in the last 72 hours. Urinalysis No results found for: COLORURINE, APPEARANCEUR, LABSPEC, [place], GLUCOSEU, HGBUR, BILIRUBINUR, KETONESUR, PROTEINUR, UROBILINOGEN, NITRITE, LEUKOCYTESUR Sepsis Labs Invalid input(s): PROCALCITONIN,  WBC,  LACTICIDVEN Microbiology No results found for this or any previous visit (from the past 240 hour(s)).   Time coordinating discharge: Over 30 minutes  SIGNED:   Donnamarie Poag [place] DO  [facility] 12/30/2021, 2:49 PM

## 2021-12-31 DIAGNOSIS — E871 Hypo-osmolality and hyponatremia: Secondary | ICD-10-CM | POA: Diagnosis not present

## 2021-12-31 DIAGNOSIS — Z853 Personal history of malignant neoplasm of breast: Secondary | ICD-10-CM | POA: Diagnosis not present

## 2021-12-31 DIAGNOSIS — J441 Chronic obstructive pulmonary disease with (acute) exacerbation: Secondary | ICD-10-CM | POA: Diagnosis not present

## 2021-12-31 DIAGNOSIS — E878 Other disorders of electrolyte and fluid balance, not elsewhere classified: Secondary | ICD-10-CM | POA: Diagnosis not present

## 2021-12-31 LAB — ACETYLCHOLINE RECEPTOR AB, ALL
Acety choline binding ab: <0.03 nmol/L (ref 0.00–0.24)
Acetylchol Block Ab: 10 % (ref 0–25)

## 2022-01-02 DIAGNOSIS — E039 Hypothyroidism, unspecified: Secondary | ICD-10-CM | POA: Diagnosis not present

## 2022-01-02 DIAGNOSIS — E871 Hypo-osmolality and hyponatremia: Secondary | ICD-10-CM | POA: Diagnosis not present

## 2022-01-02 DIAGNOSIS — J449 Chronic obstructive pulmonary disease, unspecified: Secondary | ICD-10-CM | POA: Diagnosis not present

## 2022-01-02 DIAGNOSIS — J9601 Acute respiratory failure with hypoxia: Secondary | ICD-10-CM | POA: Diagnosis not present

## 2022-01-02 DIAGNOSIS — F419 Anxiety disorder, unspecified: Secondary | ICD-10-CM | POA: Diagnosis not present

## 2022-01-02 DIAGNOSIS — I110 Hypertensive heart disease with heart failure: Secondary | ICD-10-CM | POA: Diagnosis not present

## 2022-01-02 DIAGNOSIS — K5900 Constipation, unspecified: Secondary | ICD-10-CM | POA: Diagnosis not present

## 2022-01-02 DIAGNOSIS — K219 Gastro-esophageal reflux disease without esophagitis: Secondary | ICD-10-CM | POA: Diagnosis not present

## 2022-01-02 DIAGNOSIS — I5030 Unspecified diastolic (congestive) heart failure: Secondary | ICD-10-CM | POA: Diagnosis not present

## 2022-01-07 ENCOUNTER — Telehealth: Payer: Self-pay | Admitting: *Deleted

## 2022-01-07 DIAGNOSIS — Z79899 Other long term (current) drug therapy: Secondary | ICD-10-CM | POA: Diagnosis not present

## 2022-01-07 DIAGNOSIS — E559 Vitamin D deficiency, unspecified: Secondary | ICD-10-CM | POA: Diagnosis not present

## 2022-01-07 NOTE — Telephone Encounter (Signed)
ATC patient x1 regarding bipap.  Left a message for her to bring her SD card with her if she has one to her visit with Rexene Edison NP on 01/08/22 and to call the office with any questions.

## 2022-01-08 ENCOUNTER — Other Ambulatory Visit: Payer: Self-pay

## 2022-01-08 ENCOUNTER — Telehealth: Payer: Self-pay | Admitting: *Deleted

## 2022-01-08 ENCOUNTER — Telehealth: Payer: Self-pay | Admitting: Adult Health

## 2022-01-08 ENCOUNTER — Encounter: Payer: Self-pay | Admitting: Adult Health

## 2022-01-08 ENCOUNTER — Ambulatory Visit: Payer: Medicare PPO | Admitting: Adult Health

## 2022-01-08 VITALS — BP 92/46 | HR 86 | Temp 98.8°F | Ht 59.0 in | Wt 99.8 lb

## 2022-01-08 DIAGNOSIS — E43 Unspecified severe protein-calorie malnutrition: Secondary | ICD-10-CM | POA: Diagnosis not present

## 2022-01-08 DIAGNOSIS — J449 Chronic obstructive pulmonary disease, unspecified: Secondary | ICD-10-CM | POA: Diagnosis not present

## 2022-01-08 DIAGNOSIS — R5381 Other malaise: Secondary | ICD-10-CM

## 2022-01-08 DIAGNOSIS — C7A8 Other malignant neuroendocrine tumors: Secondary | ICD-10-CM

## 2022-01-08 DIAGNOSIS — J9612 Chronic respiratory failure with hypercapnia: Secondary | ICD-10-CM

## 2022-01-08 DIAGNOSIS — J9611 Chronic respiratory failure with hypoxia: Secondary | ICD-10-CM

## 2022-01-08 DIAGNOSIS — J439 Emphysema, unspecified: Secondary | ICD-10-CM

## 2022-01-08 NOTE — Telephone Encounter (Signed)
Spoke with Brad with [employer], they now own [employer].  They have no records in their system for the Bipap.  Advised I would get the family to get the serial # and then get that # to him so we can get a download and get a dreamwear nasal mask ordered. Family member to take a picture of the serial # on Bipap machine and attach it to a [employer].  Will await information and then call Brad.

## 2022-01-08 NOTE — Progress Notes (Signed)
@Patient  ID: Victoria Rice, female    DOB: Apr 24, 1931, 88 y.o.   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  As above patient was seen previously for pulmonary consult October 2020 for COPD.  She was found to have severe COPD on PFTs.  She was recommended to use Combivent as needed. Patient says overall since last visit in 2020 she was doing okay with daily shortness of breath with activities until this last 2 months. Started to notice this past year she has slowly been getting more dyspnea with activity with decreased activity tolerance.  4 weeks ago started having progressive shortness of breath and confusion.  Patient was admitted to the hospital and found to be hypoxic and hypercarbic. PCO2 was 106,. pH 7.218 .  She required initial BiPAP support.  She was treated for COPD exacerbation with IV antibiotics steroids and nebulized bronchodilators.  Required diuresis for decompensated diastolic heart failure.  Patient required oxygen at 2 L at discharge.  She also was started on BiPAP support at bedtime at discharge. BIPAP download was requested.  Patient was discharged to a Rehab at [facility]. .  Since discharge patient is feeling better, thinking more clearing, less confusion. Trying to  get stronger. Doing PT at rehab. Can walk with walker. Plan is to discharge to [place] [facility] , she has been on wait list for years and now apartment is ready . She is accompanied by son and daughter . She was started on TRELEGY , she says she thinks this helps some . She is trying to wear BIPAP but is having a hard time tolerating it. It has already rubbed a sore on her nose .     Allergies  Allergen Reactions   Actonel [Risedronate Sodium] Other (See Comments)    indigestion    Immunization History  Administered Date(s) Administered   Fluad Quad(high Dose 65+) 09/06/2021   Influenza, High Dose Seasonal PF 09/20/2019   PFIZER(Purple Top)SARS-COV-2 Vaccination 01/01/2020, 01/23/2020, 09/22/2020    Past Medical History:  Diagnosis Date   Breast cancer ([place])    lumpectomy, no chemo or XRT; 1 year of anastrazole   Cataract    COPD, severe ([place]) 07/31/2016   Family history of adverse reaction to anesthesia    " my sisiter was unable to move for a while with the spinal anesthesia."   Hypothyroidism    nodule    Neuroendocrine carcinoma of lung ([place]) 10/26/2016   Nodule of left lung    lower lobe   Pneumonia    as an infant   Sebaceous cyst of labia 08/18/2018   Shortness of breath dyspnea    recently increased    Skin cancer    nose- tx. with [facility]    Thyroid nodule  Vaginal delivery    x2 /w  one being breech    Tobacco History: Social History   Tobacco Use  Smoking Status Former   Packs/day: 0.75   Years: 20.00   Pack years: 15.00   Types: Cigarettes   Quit date: 05/08/1974   Years since quitting: 47.7  Smokeless Tobacco Never   Counseling given: Not Answered   Outpatient Medications Prior to Visit  Medication Sig Dispense Refill   albuterol (VENTOLIN HFA) 108 (90 Base) MCG/ACT inhaler Inhale 1-2 puffs into the lungs every 6 (six) hours as needed for wheezing or shortness of breath.     ALPRAZolam (XANAX) 0.25 MG tablet Take 1 tablet  (0.25 mg total) by mouth at bedtime as needed for anxiety. 30 tablet 0   amLODipine (NORVASC) 2.5 MG tablet Take 2.5 mg by mouth daily.     cholecalciferol (VITAMIN D) 1000 units tablet Take 1,000 Units by mouth daily.     Fluticasone-Umeclidin-Vilant (TRELEGY ELLIPTA) 200-62.5-25 MCG/ACT AEPB Inhale 1 puff into the lungs daily. 28 each 3   furosemide (LASIX) 40 MG tablet Take 40 mg by mouth daily.     levothyroxine (SYNTHROID, LEVOTHROID) 50 MCG tablet Take 50 mcg by mouth daily before breakfast.     pantoprazole (PROTONIX) 40 MG tablet Take 1 tablet (40 mg total) by mouth daily.     senna-docusate (SENOKOT-S) 8.6-50 MG tablet Take 1 tablet by mouth 2 (two) times daily.     sodium chloride 1 g tablet Take 1 tablet (1 g total) by mouth 2 (two) times daily with a meal.     guaiFENesin-dextromethorphan (ROBITUSSIN DM) 100-10 MG/5ML syrup Take 5 mLs by mouth every 4 (four) hours as needed for cough (chest congestion). (Patient not taking: Reported on 01/08/2022) 118 mL 0   naproxen sodium (ANAPROX) 220 MG tablet Take 220 mg by mouth 2 (two) times daily as needed (for pain.).  (Patient not taking: Reported on 01/08/2022)     polyethylene glycol (MIRALAX / GLYCOLAX) 17 g packet Take 17 g by mouth daily as needed for mild constipation. (Patient not taking: Reported on 01/08/2022) 14 each 0   traZODone (DESYREL) 50 MG tablet Take 1 tablet (50 mg total) by mouth at bedtime. (Patient not taking: Reported on 01/08/2022) 30 tablet 0   No facility-administered medications prior to visit.     Review of Systems:   Constitutional:   No  weight loss, night sweats,  Fevers, chills,  +fatigue, or  lassitude.  HEENT:   No headaches,  Difficulty swallowing,  Tooth/dental problems, or  Sore throat,                No sneezing, itching, ear ache, nasal congestion, post nasal drip,   CV:  No chest pain,  Orthopnea, PND, swelling in lower extremities, anasarca, dizziness, palpitations, syncope.   GI  No heartburn,  indigestion, abdominal pain, nausea, vomiting, diarrhea, change in bowel habits, loss of appetite, bloody stools.   Resp:   No chest wall deformity  Skin: no rash or lesions.  GU: no dysuria, change in color of urine, no urgency or frequency.  No flank pain, no hematuria   MS:  No joint pain or swelling.  No decreased range of motion.  No back pain.    Physical Exam    GEN: A/Ox3; pleasant , NAD, chronically ill-appearing, in wheelchair, frail, on oxygen   HEENT:  [place]/AT,   NOSE-clear, THROAT-clear, no lesions, no postnasal drip or exudate noted.   NECK:  Supple w/ fair ROM; no JVD; normal carotid impulses w/o bruits; no thyromegaly or nodules palpated; no lymphadenopathy.    RESP few trace rhonchi . no accessory muscle use, no dullness to percussion  CARD:  RRR, no m/r/g, tr peripheral edema, pulses intact, no cyanosis or clubbing.  GI:   Soft & nt; nml bowel sounds; no organomegaly or masses detected.   Musco: Warm bil, no deformities or joint swelling noted.   Neuro: alert, no focal deficits noted.    Skin: Warm, no lesions or rashes    Lab Results:  CBC    Component Value Date/Time   WBC 10.9 (H) 12/26/2021 0328   RBC 4.75 12/26/2021 0328   HGB 14.0 12/26/2021 0328   HGB 14.7 05/28/2016 1232   HCT 46.6 (H) 12/26/2021 0328   HCT 45.8 05/28/2016 1232   PLT 138 (L) 12/26/2021 0328   PLT 261 05/28/2016 1232   MCV 98.1 12/26/2021 0328   MCV 95.2 05/28/2016 1232   MCH 29.5 12/26/2021 0328   MCHC 30.0 12/26/2021 0328   RDW 12.5 12/26/2021 0328   RDW 13.0 05/28/2016 1232   LYMPHSABS 1.0 12/17/2021 0244   LYMPHSABS 1.5 05/28/2016 1232   MONOABS 1.6 (H) 12/17/2021 0244   MONOABS 0.5 05/28/2016 1232   EOSABS 0.0 12/17/2021 0244   EOSABS 0.1 05/28/2016 1232   BASOSABS 0.0 12/17/2021 0244   BASOSABS 0.0 05/28/2016 1232    BMET    Component Value Date/Time   NA 136 12/29/2021 0358   NA 139 05/28/2016 1232   K 3.8 12/29/2021 0358   K 4.6 05/28/2016 1232    CL 84 (L) 12/29/2021 0358   CO2 >45 (H) 12/29/2021 0358   CO2 32 (H) 05/28/2016 1232   GLUCOSE 99 12/29/2021 0358   GLUCOSE 135 05/28/2016 1232   BUN 26 (H) 12/29/2021 0358   BUN 11.3 05/28/2016 1232   CREATININE 0.67 12/29/2021 0358   CREATININE 1.0 05/28/2016 1232   CALCIUM 8.3 (L) 12/29/2021 0358   CALCIUM 9.6 05/28/2016 1232   GFRNONAA >60 12/29/2021 0358   GFRAA >60 08/02/2018 1737    BNP    Component Value Date/Time   BNP 72.5 12/25/2021 0339    ProBNP No results found for: PROBNP  Imaging: DG Abd 1 View  Result Date: 12/19/2021 CLINICAL DATA:  Abdominal pain. EXAM: ABDOMEN - 1 VIEW COMPARISON:  None. FINDINGS: The bowel gas pattern is normal. No radio-opaque calculi or other significant radiographic abnormality are seen. IMPRESSION: Negative. Electronically Signed   By: Marijo Conception M.D.   On: 12/19/2021 14:38   DG CHEST PORT 1 VIEW  Result Date: 12/25/2021 CLINICAL DATA:  Pleural effusion EXAM: PORTABLE CHEST 1 VIEW COMPARISON:  Portable exam 0504 hours compared to 12/21/2021 FINDINGS: Normal heart size, mediastinal contours, and pulmonary vascularity. Atherosclerotic calcification aorta. Small bibasilar pleural effusions and basilar atelectasis slightly greater on LEFT. Upper lungs clear. No new infiltrate or pneumothorax. Bones demineralized. IMPRESSION: Persistent bibasilar effusions and atelectasis greater on LEFT. Aortic Atherosclerosis (ICD10-I70.0). Electronically Signed   By: Lavonia Dana M.D.   On: 12/25/2021 08:20   DG CHEST PORT 1 VIEW  Result Date: 12/21/2021 CLINICAL DATA:  Tachypnea, COPD EXAM: PORTABLE CHEST 1 VIEW COMPARISON:  12/19/2021 chest radiograph. FINDINGS: Clips overlie the lower chest bilaterally. Stable cardiomediastinal silhouette with top-normal heart size. No pneumothorax. Small bilateral pleural effusions, stable. No overt pulmonary edema. Left lower lobe atelectasis, unchanged. IMPRESSION: Stable small bilateral pleural effusions and left  lower lobe atelectasis. Electronically Signed  By: Ilona Sorrel M.D.   On: 12/21/2021 09:32   DG CHEST PORT 1 VIEW  Result Date: 12/19/2021 CLINICAL DATA:  Respiratory compromise EXAM: PORTABLE CHEST 1 VIEW COMPARISON:  Four days ago FINDINGS: Hazy bilateral lower chest opacification which is new/progressed. Bilateral pleural fluid. No pneumothorax. Normal heart size and mediastinal contours. IMPRESSION: New or progressive opacity of the lower chest with pleural fluid and either atelectasis or edema. Electronically Signed   By: Jorje Guild M.D.   On: 12/19/2021 07:49   DG Chest Port 1 View  Result Date: 12/15/2021 CLINICAL DATA:  Dyspnea EXAM: PORTABLE CHEST 1 VIEW COMPARISON:  Chest radiograph dated November 05, 2021 FINDINGS: The heart size and mediastinal contours are within normal limits. Atherosclerotic calcification of aortic arch. Both lungs are clear. The visualized skeletal structures are unremarkable. IMPRESSION: No acute cardiopulmonary process. Electronically Signed   By: Keane Police D.O.   On: 12/15/2021 14:07   ECHOCARDIOGRAM COMPLETE  Result Date: 12/21/2021    ECHOCARDIOGRAM REPORT   Patient Name:   JERIANNE ANSELMO Date of Exam: 12/21/2021 Medical Rec #:  [id number]         Height:       60.0 in Accession #:    [id number]        Weight:       113.8 lb Date of Birth:  [birth year]        BSA:          1.469 m Patient Age:    74 years          BP:           100/41 mmHg Patient Gender: F                 HR:           78 bpm. Exam Location:  Inpatient Procedure: 2D Echo Indications:    Acute Respiratory Distress  History:        Patient has prior history of Echocardiogram examinations, most                 recent 09/26/2019. COPD; Signs/Symptoms:Shortness of Breath.  Sonographer:    Arlyss Gandy Referring Phys: [place]  1. Left ventricular ejection fraction, by estimation, is 60 to 65%. The left ventricle has normal function. The left ventricle has no regional  wall motion abnormalities. Left ventricular diastolic parameters are consistent with Grade I diastolic dysfunction (impaired relaxation).  2. Right ventricular systolic function is normal. The right ventricular size is dilated. There is mildly elevated pulmonary artery systolic pressure. The estimated right ventricular systolic pressure is 36.6 mmHg.  3. The mitral valve was not well visualized. No evidence of mitral valve regurgitation. No evidence of mitral stenosis.  4. The aortic valve was not well visualized. Aortic valve regurgitation is not visualized. No aortic stenosis is present.  5. The inferior vena cava is normal in size with <50% respiratory variability, suggesting right atrial pressure of 8 mmHg. Comparison(s): A prior study was performed on 09/26/2019. RVSP increased from prior. FINDINGS  Left Ventricle: Left ventricular ejection fraction, by estimation, is 60 to 65%. The left ventricle has normal function. The left ventricle has no regional wall motion abnormalities. The left ventricular internal cavity size was small. There is no left ventricular hypertrophy. Left ventricular diastolic parameters are consistent with Grade I diastolic dysfunction (impaired relaxation). Right Ventricle: The right ventricular size is moderately enlarged. No increase in right ventricular wall thickness. Right ventricular  systolic function is normal. There is mildly elevated pulmonary artery systolic pressure. The tricuspid regurgitant velocity is 2.73 m/s, and with an assumed right atrial pressure of 15 mmHg, the estimated right ventricular systolic pressure is 35.4 mmHg. Left Atrium: Left atrial size was normal in size. Right Atrium: Right atrial size was normal in size. Pericardium: There is no evidence of pericardial effusion. Mitral Valve: The mitral valve was not well visualized. No evidence of mitral valve regurgitation. No evidence of mitral valve stenosis. Tricuspid Valve: The tricuspid valve is normal in  structure. Tricuspid valve regurgitation is trivial. No evidence of tricuspid stenosis. Aortic Valve: The aortic valve was not well visualized. Aortic valve regurgitation is not visualized. No aortic stenosis is present. Aortic valve mean gradient measures 4.5 mmHg. Aortic valve peak gradient measures 8.3 mmHg. Aortic valve area, by VTI measures 2.65 cm. Pulmonic Valve: The pulmonic valve was not well visualized. Pulmonic valve regurgitation is not visualized. No evidence of pulmonic stenosis. Aorta: The aortic root and ascending aorta are structurally normal, with no evidence of dilitation. Venous: The inferior vena cava is normal in size with less than 50% respiratory variability, suggesting right atrial pressure of 8 mmHg. IAS/Shunts: No atrial level shunt detected by color flow Doppler.  LEFT VENTRICLE PLAX 2D LVIDd:         3.30 cm   Diastology LVIDs:         2.20 cm   LV e' medial:    7.72 cm/s LV PW:         0.70 cm   LV E/e' medial:  12.4 LV IVS:        0.70 cm   LV e' lateral:   10.10 cm/s LVOT diam:     1.90 cm   LV E/e' lateral: 9.5 LV SV:         68 LV SV Index:   47 LVOT Area:     2.84 cm  RIGHT VENTRICLE             IVC RV S prime:     12.80 cm/s  IVC diam: 2.20 cm TAPSE (M-mode): 2.3 cm LEFT ATRIUM             Index        RIGHT ATRIUM           Index LA diam:        2.50 cm 1.70 cm/m   RA Area:     11.80 cm LA Vol (A2C):   22.3 ml 15.18 ml/m  RA Volume:   26.00 ml  17.70 ml/m LA Vol (A4C):   27.4 ml 18.66 ml/m LA Biplane Vol: 25.1 ml 17.09 ml/m  AORTIC VALVE AV Area (Vmax):    2.61 cm AV Area (Vmean):   2.61 cm AV Area (VTI):     2.65 cm AV Vmax:           144.00 cm/s AV Vmean:          98.500 cm/s AV VTI:            0.258 m AV Peak Grad:      8.3 mmHg AV Mean Grad:      4.5 mmHg LVOT Vmax:         132.50 cm/s LVOT Vmean:        90.800 cm/s LVOT VTI:          0.242 m LVOT/AV VTI ratio: 0.93  AORTA Ao Root diam: 2.30 cm Ao Asc diam:  2.10  cm MITRAL VALVE               TRICUSPID VALVE MV  Area (PHT): 4.63 cm    TR Peak grad:   29.8 mmHg MV Decel Time: 164 msec    TR Vmax:        273.00 cm/s MV E velocity: 95.50 cm/s MV A velocity: 99.40 cm/s  SHUNTS MV E/A ratio:  0.96        Systemic VTI:  0.24 m                            Systemic Diam: 1.90 cm Rudean Haskell MD Electronically signed by Rudean Haskell MD Signature Date/Time: 12/21/2021/2:43:51 PM    Final       PFT Results Latest Ref Rng & Units 07/18/2016  FVC-Pre L 0.96  FVC-Predicted Pre % 50  FVC-Post L 1.24  FVC-Predicted Post % 65  Pre FEV1/FVC % % 49  Post FEV1/FCV % % 48  FEV1-Pre L 0.47  FEV1-Predicted Pre % 33  FEV1-Post L 0.60  DLCO uncorrected ml/min/mmHg 14.13  DLCO UNC% % 80  DLVA Predicted % 167  TLC L 5.06  TLC % Predicted % 117  RV % Predicted % 170    No results found for: NITRICOXIDE      Assessment & Plan:   No problem-specific Assessment & Plan notes found for this encounter.     Rexene Edison, NP 01/08/2022

## 2022-01-08 NOTE — Telephone Encounter (Signed)
Mia, [facility] is wanting to know if Tammy, NP would be ok with patient getting cxr at [facility] and having results faxed for Tammy, NP to review?  Patient LOV 01/08/22- Tammy,NP  Instructions  Continue on TRELEGY 1 puff daily  Albuterol inhaler As needed   Activity as tolerated Continue on Oxygen 2l/m  Continue on BIPAP At bedtime  and with naps Change to dream wear nasal  mask BIPAP download  Follow up with Dr. Loanne Drilling in 2 weeks with chest xray and As needed.  Please contact office for sooner follow up if symptoms do not improve or worsen or seek emergency care

## 2022-01-08 NOTE — Patient Instructions (Signed)
Continue on TRELEGY 1 puff daily  Albuterol inhaler As needed   Activity as tolerated Continue on Oxygen 2l/m  Continue on BIPAP At bedtime  and with naps Change to dream wear nasal  mask BIPAP download  Follow up with Dr. Loanne Drilling in 2 weeks with chest xray and As needed.  Please contact office for sooner follow up if symptoms do not improve or worsen or seek emergency care

## 2022-01-09 DIAGNOSIS — E43 Unspecified severe protein-calorie malnutrition: Secondary | ICD-10-CM | POA: Insufficient documentation

## 2022-01-09 DIAGNOSIS — R5381 Other malaise: Secondary | ICD-10-CM | POA: Insufficient documentation

## 2022-01-09 DIAGNOSIS — J9612 Chronic respiratory failure with hypercapnia: Secondary | ICD-10-CM | POA: Insufficient documentation

## 2022-01-09 DIAGNOSIS — J9611 Chronic respiratory failure with hypoxia: Secondary | ICD-10-CM | POA: Insufficient documentation

## 2022-01-09 NOTE — Assessment & Plan Note (Signed)
Severe protein calorie malnutrition.  Patient is encouraged on a high-protein diet.

## 2022-01-09 NOTE — Assessment & Plan Note (Signed)
Recent hospitalization with severe hypercarbic respiratory failure patient is significantly improved on BiPAP support.  BiPAP download has been requested. Patient is high risk for decompensation and rehospitalization.  Would continue on BiPAP at bedtime and with naps.  Patient education on BiPAP. She has significant severe protein calorie malnutrition along with severe physical deconditioning. Recommend high-protein diet along with physical therapy. She would benefit from rollator walker with bench seat and basket. Continue on oxygen to maintain O2 saturations greater than 88 to 90%. Maintain euvolemic status. Check chest x-ray on return visit  Plan  Patient Instructions  Continue on TRELEGY 1 puff daily  Albuterol inhaler As needed   Activity as tolerated Continue on Oxygen 2l/m  Continue on BIPAP At bedtime  and with naps Change to dream wear nasal  mask BIPAP download  Follow up with Dr. Loanne Drilling in 2 weeks with chest xray and As needed.  Please contact office for sooner follow up if symptoms do not improve or worsen or seek emergency care

## 2022-01-09 NOTE — Assessment & Plan Note (Signed)
Patient is under observation.  Deemed not a surgical candidate. Chest x-ray on return visit

## 2022-01-09 NOTE — Telephone Encounter (Signed)
Called and spoke with Mia letting her know the info per TP and she verbalized understanding. Nothing further needed.

## 2022-01-09 NOTE — Telephone Encounter (Signed)
She is getting chest xray in 2 weeks when she comes back to our office

## 2022-01-09 NOTE — Assessment & Plan Note (Signed)
Patient has significant physical deconditioning.  Continue with physical therapy as planned.

## 2022-01-09 NOTE — Assessment & Plan Note (Signed)
Severe COPD with recent exacerbation -patient is high risk for decompensation and rehospitalization.  We will continue on triple therapy maintenance inhaler.  Continue on nocturnal BiPAP support to help with ongoing hypoxia and hypercarbia. She has significant severe protein calorie malnutrition along with severe physical deconditioning. Recommend high-protein diet along with physical therapy. She would benefit from rollator walker with bench seat and basket. Continue on oxygen to maintain O2 saturations greater than 88 to 90%. Maintain euvolemic status. Check chest x-ray on return visit  Plan  Patient Instructions  Continue on TRELEGY 1 puff daily  Albuterol inhaler As needed   Activity as tolerated Continue on Oxygen 2l/m  Continue on BIPAP At bedtime  and with naps Change to dream wear nasal  mask BIPAP download  Follow up with Dr. Loanne Drilling in 2 weeks with chest xray and As needed.  Please contact office for sooner follow up if symptoms do not improve or worsen or seek emergency care    '

## 2022-01-10 ENCOUNTER — Other Ambulatory Visit: Payer: Self-pay | Admitting: *Deleted

## 2022-01-10 DIAGNOSIS — J449 Chronic obstructive pulmonary disease, unspecified: Secondary | ICD-10-CM

## 2022-01-10 DIAGNOSIS — J9602 Acute respiratory failure with hypercapnia: Secondary | ICD-10-CM

## 2022-01-10 DIAGNOSIS — J9612 Chronic respiratory failure with hypercapnia: Secondary | ICD-10-CM

## 2022-01-16 DIAGNOSIS — I509 Heart failure, unspecified: Secondary | ICD-10-CM | POA: Diagnosis not present

## 2022-01-16 DIAGNOSIS — K219 Gastro-esophageal reflux disease without esophagitis: Secondary | ICD-10-CM | POA: Diagnosis not present

## 2022-01-16 DIAGNOSIS — F419 Anxiety disorder, unspecified: Secondary | ICD-10-CM | POA: Diagnosis not present

## 2022-01-16 DIAGNOSIS — I110 Hypertensive heart disease with heart failure: Secondary | ICD-10-CM | POA: Diagnosis not present

## 2022-01-16 DIAGNOSIS — J449 Chronic obstructive pulmonary disease, unspecified: Secondary | ICD-10-CM | POA: Diagnosis not present

## 2022-01-18 ENCOUNTER — Other Ambulatory Visit (HOSPITAL_COMMUNITY): Payer: Self-pay

## 2022-01-20 DIAGNOSIS — J9602 Acute respiratory failure with hypercapnia: Secondary | ICD-10-CM | POA: Diagnosis not present

## 2022-01-20 DIAGNOSIS — Z9981 Dependence on supplemental oxygen: Secondary | ICD-10-CM | POA: Diagnosis not present

## 2022-01-20 DIAGNOSIS — E039 Hypothyroidism, unspecified: Secondary | ICD-10-CM | POA: Diagnosis not present

## 2022-01-20 DIAGNOSIS — J432 Centrilobular emphysema: Secondary | ICD-10-CM | POA: Diagnosis not present

## 2022-01-20 DIAGNOSIS — J449 Chronic obstructive pulmonary disease, unspecified: Secondary | ICD-10-CM | POA: Diagnosis not present

## 2022-01-20 DIAGNOSIS — I10 Essential (primary) hypertension: Secondary | ICD-10-CM | POA: Diagnosis not present

## 2022-01-20 DIAGNOSIS — Z853 Personal history of malignant neoplasm of breast: Secondary | ICD-10-CM | POA: Diagnosis not present

## 2022-01-21 ENCOUNTER — Other Ambulatory Visit: Payer: Self-pay

## 2022-01-21 ENCOUNTER — Encounter: Payer: Self-pay | Admitting: Pulmonary Disease

## 2022-01-21 ENCOUNTER — Ambulatory Visit: Payer: Medicare PPO | Admitting: Pulmonary Disease

## 2022-01-21 VITALS — BP 118/62 | HR 85 | Temp 98.2°F | Ht 59.0 in | Wt 100.0 lb

## 2022-01-21 DIAGNOSIS — M7989 Other specified soft tissue disorders: Secondary | ICD-10-CM

## 2022-01-21 DIAGNOSIS — J9611 Chronic respiratory failure with hypoxia: Secondary | ICD-10-CM | POA: Diagnosis not present

## 2022-01-21 DIAGNOSIS — J9612 Chronic respiratory failure with hypercapnia: Secondary | ICD-10-CM

## 2022-01-21 DIAGNOSIS — J449 Chronic obstructive pulmonary disease, unspecified: Secondary | ICD-10-CM

## 2022-01-21 MED ORDER — TRELEGY ELLIPTA 200-62.5-25 MCG/ACT IN AEPB: 1.0000 | INHALATION_SPRAY | Freq: Every day | RESPIRATORY_TRACT | 11 refills | Status: DC

## 2022-01-21 MED ORDER — FUROSEMIDE 40 MG PO TABS: 40.0000 mg | ORAL_TABLET | Freq: Every day | ORAL | 2 refills | Status: DC | PRN

## 2022-01-21 MED ORDER — FLUTICASONE-UMECLIDIN-VILANT 200-62.5-25 MCG/ACT IN AEPB: 1.0000 | INHALATION_SPRAY | Freq: Every day | RESPIRATORY_TRACT | 0 refills | Status: DC

## 2022-01-21 NOTE — Patient Instructions (Addendum)
COPD/Emphysema --CONTINUE Trelegy 200-62.5-25 mcg ONE puff TWICE day --CONTINUE Albuterol AS NEEDED for shortness of breath or wheezing  Chronic hypoxemic and hypercapneic respiratory failure --CONTINUE 2L O2 with activity --CONTINUE BiPAP at night --Ambulatory walk today with POC device  Lower extremity swelling --Take lasix AS NEEDED for leg swelling or for weight gain >3lbs in a day or 5 lbs in a week --Notify our office if you are taking it for more than 3 days and swelling is not improved or worsening  Follow-up with me in 3 months

## 2022-01-21 NOTE — Progress Notes (Signed)
Subjective:   PATIENT ID: Victoria Rice GENDER: female DOB: [birth year], MRN: [id number]   HPI  Chief Complaint  Patient presents with   Hospitalization Follow-up    She reports some shortness of breath with exertion and is better since in the hospital.     Reason for Visit: New consult for COPD/emphysema  Victoria Rice is a 88 year old female with COPD/emphysema, history of neuroendocrine carcinoma in 2017, bronchiectasis, hx stage IA left breast cancer s/p lumpectomy in 2017 who presents for new consult.   She was previously seen at [facility] with Dr. Loletta Specter with the last clinic visit on 10/04/19. She was seen in hospital follow-up on 01/08/22 with NP Parrett. She was admitted for acute hypoxemic and hypercarbic respiratory failure with PCO2 106, treated with NIV, antibiotics, bronchodilators, steroids and oxygen. She required 2L at discharge. She recently completed rehab at [place]. Currently on Trelegy, home oxygen and BiPAP.  She is able to walk and only uses walker as needed for long distances or rough terrain. She is compliant with oxygen with activity and sleeps with BiPAP for 4-6 hours nightly. Currently wearing full face mask and awaiting nasal prongs with dreamwear. Has been compliant Trelegy with improvement in her symptoms. Rarely uses albuterol. Has some shortness of breath with moderate/heavy exertion. Denies chronic cough, congestion. Occasional wheezing.   Social History: Former smoker. Quit in 1975.   I have personally reviewed patient's past medical/family/social history, allergies, current medications.  Past Medical History:  Diagnosis Date   Breast cancer ([place])    lumpectomy, no chemo or XRT; 1 year of anastrazole   Cataract    COPD, severe ([place]) 07/31/2016   Family history of adverse reaction to anesthesia    " my sisiter was unable to move for a while with the spinal anesthesia."   Hypothyroidism    nodule    Neuroendocrine carcinoma of  lung ([place]) 10/26/2016   Nodule of left lung    lower lobe   Pneumonia    as an infant   Sebaceous cyst of labia 08/18/2018   Shortness of breath dyspnea    recently increased    Skin cancer    nose- tx. with [facility]    Thyroid nodule    Vaginal delivery    x2 /w  one being breech     Family History  Problem Relation Age of Onset   Prostate cancer Brother    Lung cancer Brother    Cystic fibrosis Cousin      Social History   Occupational History   Not on file  Tobacco Use   Smoking status: Former    Packs/day: 0.75    Years: 20.00    Pack years: 15.00    Types: Cigarettes    Quit date: 05/08/1974    Years since quitting: 47.7   Smokeless tobacco: Never  Vaping Use   Vaping Use: Never used  Substance and Sexual Activity   Alcohol use: Yes    Alcohol/week: 6.0 standard drinks    Types: 6 Glasses of wine per week    Comment: social   Drug use: No   Sexual activity: Never    Allergies  Allergen Reactions   Actonel [Risedronate Sodium] Other (See Comments)    indigestion     Outpatient Medications Prior to Visit  Medication Sig Dispense Refill   albuterol (VENTOLIN HFA) 108 (90 Base) MCG/ACT inhaler Inhale 1-2 puffs into the lungs every 6 (six) hours as needed  for wheezing or shortness of breath.     ALPRAZolam (XANAX) 0.25 MG tablet Take 1 tablet (0.25 mg total) by mouth at bedtime as needed for anxiety. 30 tablet 0   amLODipine (NORVASC) 2.5 MG tablet Take 2.5 mg by mouth daily.     cholecalciferol (VITAMIN D) 1000 units tablet Take 1,000 Units by mouth daily.     levothyroxine (SYNTHROID, LEVOTHROID) 50 MCG tablet Take 50 mcg by mouth daily before breakfast.     naproxen sodium (ANAPROX) 220 MG tablet Take 220 mg by mouth 2 (two) times daily as needed (for pain.).     pantoprazole (PROTONIX) 40 MG tablet Take 1 tablet (40 mg total) by mouth daily.     polyethylene glycol (MIRALAX / GLYCOLAX) 17 g packet Take 17 g by mouth daily as needed for mild  constipation. 14 each 0   senna-docusate (SENOKOT-S) 8.6-50 MG tablet Take 1 tablet by mouth 2 (two) times daily.     sodium chloride 1 g tablet Take 1 tablet (1 g total) by mouth 2 (two) times daily with a meal.     traZODone (DESYREL) 50 MG tablet Take 1 tablet (50 mg total) by mouth at bedtime. 30 tablet 0   Fluticasone-Umeclidin-Vilant (TRELEGY ELLIPTA) 200-62.5-25 MCG/ACT AEPB Inhale 1 puff into the lungs daily. 28 each 3   furosemide (LASIX) 40 MG tablet Take 40 mg by mouth daily.     guaiFENesin-dextromethorphan (ROBITUSSIN DM) 100-10 MG/5ML syrup Take 5 mLs by mouth every 4 (four) hours as needed for cough (chest congestion). 118 mL 0   umeclidinium bromide (INCRUSE ELLIPTA) 62.5 MCG/ACT AEPB 1 puff     No facility-administered medications prior to visit.    Review of Systems  Constitutional:  Negative for chills, diaphoresis, fever, malaise/fatigue and weight loss.  HENT:  Negative for congestion, ear pain and sore throat.   Respiratory:  Positive for shortness of breath and wheezing. Negative for cough, hemoptysis and sputum production.   Cardiovascular:  Negative for chest pain, palpitations and leg swelling.  Gastrointestinal:  Negative for abdominal pain, heartburn and nausea.  Genitourinary:  Negative for frequency.  Musculoskeletal:  Negative for joint pain and myalgias.  Skin:  Negative for itching and rash.  Neurological:  Negative for dizziness, weakness and headaches.  Endo/Heme/Allergies:  Does not bruise/bleed easily.  Psychiatric/Behavioral:  Negative for depression. The patient is not nervous/anxious.     Objective:   Vitals:   01/21/22 1338  BP: 118/62  Pulse: 85  Temp: 98.2 F (36.8 C)  TempSrc: Oral  SpO2: 98%  Weight: 100 lb (45.4 kg)  Height: 4' 11" (1.499 m)   SpO2: 98 % O2 Device: Nasal cannula O2 Flow Rate (L/min): 2 L/min O2 Type: Continuous O2  Physical Exam: General: Elderly, frail-appearing, no acute distress HENT: [place], AT Eyes: EOMI,  no scleral icterus Respiratory: Clear to auscultation bilaterally.  No crackles, wheezing or rales Cardiovascular: RRR, -M/R/G, no JVD Extremities:-Edema,-tenderness Neuro: AAO x4, CNII-XII grossly intact Psych: Normal mood, normal affect   Data Reviewed:  Imaging: CT chest 09/01/2019-mosaicism, emphysema, prominent airway thickening.  Metallic fiducial marker in left lower lobe.  Nodule in inferior lingula bronchiectasis in the right middle lobe with associated peripheral nodules.  PFT: 07/18/16 FVC 1.24 (65%) FEV1 0.60 (42%) Ratio 49  TLC 117% RV 170% RV/TLC 151% DLCO 80% Interpretation: Severe obstructive defect with significant bronchodilator response. Air trapping present.  Labs: CBC    Component Value Date/Time   WBC 10.9 (H) 12/26/2021 4540  RBC 4.75 12/26/2021 0328   HGB 14.0 12/26/2021 0328   HGB 14.7 05/28/2016 1232   HCT 46.6 (H) 12/26/2021 0328   HCT 45.8 05/28/2016 1232   PLT 138 (L) 12/26/2021 0328   PLT 261 05/28/2016 1232   MCV 98.1 12/26/2021 0328   MCV 95.2 05/28/2016 1232   MCH 29.5 12/26/2021 0328   MCHC 30.0 12/26/2021 0328   RDW 12.5 12/26/2021 0328   RDW 13.0 05/28/2016 1232   LYMPHSABS 1.0 12/17/2021 0244   LYMPHSABS 1.5 05/28/2016 1232   MONOABS 1.6 (H) 12/17/2021 0244   MONOABS 0.5 05/28/2016 1232   EOSABS 0.0 12/17/2021 0244   EOSABS 0.1 05/28/2016 1232   BASOSABS 0.0 12/17/2021 0244   BASOSABS 0.0 05/28/2016 1232   BMET    Component Value Date/Time   NA 136 12/29/2021 0358   NA 139 05/28/2016 1232   K 3.8 12/29/2021 0358   K 4.6 05/28/2016 1232   CL 84 (L) 12/29/2021 0358   CO2 >45 (H) 12/29/2021 0358   CO2 32 (H) 05/28/2016 1232   GLUCOSE 99 12/29/2021 0358   GLUCOSE 135 05/28/2016 1232   BUN 26 (H) 12/29/2021 0358   BUN 11.3 05/28/2016 1232   CREATININE 0.67 12/29/2021 0358   CREATININE 1.0 05/28/2016 1232   CALCIUM 8.3 (L) 12/29/2021 0358   CALCIUM 9.6 05/28/2016 1232   EGFR 53 (L) 05/28/2016 1232   GFRNONAA >60 12/29/2021  0358   Cr stable  Absolute eos 12/17/21 -0     Assessment & Plan:   Discussion: 88 year old female with COPD/emphysema, history of neuroendocrine carcinoma in 2017, bronchiectasis, hx stage IA left breast cancer s/p lumpectomy in 2017 who presents as a new consult for COPD. She was recently hospitalized but improving from a functional standpoint after rehab. Continues to require oxygen with ambulation. She is on max bronchodilator therapy and NIV. If symptoms worsen in the future will add neb treatment for support.  COPD/Emphysema, GOLD D --CONTINUE Trelegy 200-62.5-25 mcg ONE puff TWICE day --CONTINUE Albuterol AS NEEDED for shortness of breath or wheezing  Chronic hypoxemic and hypercapneic respiratory failure --CONTINUE 2L O2 with activity --CONTINUE BiPAP at night --Ambulatory walk today with POC device: Requires 4L O2  Lower extremity swelling --Reviewed BMET. Cr stable. --Take lasix AS NEEDED for leg swelling or for weight gain >3lbs in a day or 5 lbs in a week --Refilled lasix --Notify our office if you are taking it for more than 3 days and swelling is not improved or worsening  Health Maintenance Immunization History  Administered Date(s) Administered   Fluad Quad(high Dose 65+) 09/06/2021   Influenza Split 09/12/2020   Influenza, High Dose Seasonal PF 09/25/2017, 09/28/2018, 09/20/2019, 08/22/2021   Influenza,inj,Quad PF,6+ Mos 10/15/2016, 09/16/2019   PFIZER(Purple Top)SARS-COV-2 Vaccination 01/01/2020, 01/23/2020, 09/22/2020   Pneumococcal Conjugate-13 05/03/2015   Tdap 07/21/2010   CT Lung Screen- not qualified due to age  No orders of the defined types were placed in this encounter.  Meds ordered this encounter  Medications   Fluticasone-Umeclidin-Vilant (TRELEGY ELLIPTA) 200-62.5-25 MCG/ACT AEPB    Sig: Inhale 1 puff into the lungs daily.    Dispense:  60 each    Refill:  11   furosemide (LASIX) 40 MG tablet    Sig: Take 1 tablet (40 mg total) by mouth  daily as needed.    Dispense:  30 tablet    Refill:  2    Return in about 3 months (around 04/20/2022).  I have spent a total  time of 45-minutes on the day of the appointment reviewing prior documentation, coordinating care and discussing medical diagnosis and plan with the patient/family. Imaging, labs and tests included in this note have been reviewed and interpreted independently by me.  [place], MD [facility] 01/21/2022 2:46 PM  Office Number [phone]

## 2022-01-21 NOTE — Addendum Note (Signed)
Addended by: Darliss Ridgel on: 01/21/2022 03:58 PM   Modules accepted: Orders

## 2022-01-22 ENCOUNTER — Telehealth: Payer: Self-pay | Admitting: Pulmonary Disease

## 2022-01-22 DIAGNOSIS — J449 Chronic obstructive pulmonary disease, unspecified: Secondary | ICD-10-CM

## 2022-01-22 DIAGNOSIS — J9612 Chronic respiratory failure with hypercapnia: Secondary | ICD-10-CM

## 2022-01-23 NOTE — Telephone Encounter (Signed)
Lm for patient's son, Steve(DPR)

## 2022-01-24 NOTE — Telephone Encounter (Signed)
Orders have been placed for patient.

## 2022-01-24 NOTE — Telephone Encounter (Signed)
Yes

## 2022-01-24 NOTE — Telephone Encounter (Signed)
Called and spoke with patient's son Victoria Rice. He stated that the order for the bipap and POC will need to be sent to [facility]. The POC was ordered 3 days ago and the bipap was ordered earlier this month. Both orders were sent to [facility].   Once the orders have been placed and signed, he like a call back so he is aware.   TP, since Dr. Loanne Drilling is not available today, would you be ok with signing off on the bipap and POC orders? Thanks!

## 2022-01-24 NOTE — Telephone Encounter (Signed)
Richardson Landry states order for BIPAP machine needs to go thru [facility]. Richardson Landry phone number is [phone].

## 2022-01-31 ENCOUNTER — Telehealth: Payer: Self-pay | Admitting: Pulmonary Disease

## 2022-01-31 NOTE — Telephone Encounter (Signed)
Called patient to verify what she needed. And patient states that she hasn't received anything regarding her Bipap or dream wear nasal mask.   Called melissa from [facility] to clarify that patient has not received anything. Lenna Sciara is going to look up patient to get info.   Will await for [facility] to update me on patient.

## 2022-02-10 ENCOUNTER — Telehealth: Payer: Self-pay | Admitting: Pulmonary Disease

## 2022-02-10 DIAGNOSIS — J9612 Chronic respiratory failure with hypercapnia: Secondary | ICD-10-CM

## 2022-02-10 NOTE — Telephone Encounter (Signed)
I left a message for Victoria Rice for her to call back.  ?

## 2022-02-11 NOTE — Telephone Encounter (Signed)
Spoke to [place] with [facility] and relayed below message. ?She stated that she would research this.  ?

## 2022-02-11 NOTE — Telephone Encounter (Signed)
I called Victoria Rice with [facility]  ?She states that the pt has been using a loaner BIPAP from rehab facility, and she needs to get set up with her own  ?She is needing to have ONO on o2 done to get qualified  ? ?Please advise if okay to order, thanks! ?

## 2022-02-11 NOTE — Telephone Encounter (Signed)
Lmtcb for pt. Which pharmacy? ?

## 2022-02-11 NOTE — Telephone Encounter (Signed)
Spoke with the pt and let her know we needs to order ONO to get BIPAP for her  ?She states this is incorrect, that Barbaraann Rondo from [place] already delivered her machine to her at the end of last wk  ?She was not told she would need further testing  ?Called Melissa with [facility] and had to [facility]  ?

## 2022-02-11 NOTE — Telephone Encounter (Signed)
OK to order 

## 2022-02-12 MED ORDER — TRELEGY ELLIPTA 200-62.5-25 MCG/ACT IN AEPB: 1.0000 | INHALATION_SPRAY | Freq: Every day | RESPIRATORY_TRACT | 11 refills | Status: DC

## 2022-02-12 NOTE — Telephone Encounter (Signed)
Called and spoke with daughter who states that they were fitted for a BIPAP mask on 2/1 when they had OV with Tammy and they are still waiting for the mask. She also states that they need RX sent in for patient for Trelegy as the samples worked very well for her. She verified preferred pharmacy. RX has been sent. Order has been placed to be sent to [facility] to get mask for patient. Called and spoke with Melissa from [facility] to verify that they do have this patient with them for her oxygen and BIPAP. She confirmed that she does and that they need to have an ONO done on patient so that they are able to bill insurance for patient. Until ONO is done patient will not be able to get supplies or anything. Called and spoke with daughter again to let her know what is going on. Daughter expressed understanding about everything and said she was going to call patient and talk to her about everything that has been discussed. Called and let Melissa know that order her been placed for ONO. Nothing further needed at this time.  ?

## 2022-02-14 DIAGNOSIS — R600 Localized edema: Secondary | ICD-10-CM | POA: Diagnosis not present

## 2022-02-14 DIAGNOSIS — I10 Essential (primary) hypertension: Secondary | ICD-10-CM | POA: Diagnosis not present

## 2022-02-17 ENCOUNTER — Encounter: Payer: Self-pay | Admitting: Pulmonary Disease

## 2022-02-17 DIAGNOSIS — G4730 Sleep apnea, unspecified: Secondary | ICD-10-CM | POA: Diagnosis not present

## 2022-02-17 DIAGNOSIS — R0683 Snoring: Secondary | ICD-10-CM | POA: Diagnosis not present

## 2022-02-17 NOTE — Addendum Note (Signed)
Addended by: Elby Beck R on: 02/17/2022 11:12 AM ? ? Modules accepted: Orders ? ?

## 2022-02-27 NOTE — Telephone Encounter (Signed)
Overnight Oximetry 02/17/22 ? ?Reviewed overnight oximetry. ?SpO2 <88% 5 hr 18 min 56 sec ?Nadir SpO2 72% ? ?Assessment ?Chronic hypoxemic and hypercarbic respiratory failure ?Nocturnal hypoxemia ?Patient has O2 orders in ?Please contact [facility] regarding ONO for orders for oxygen and BiPAP supplies ?

## 2022-02-27 NOTE — Telephone Encounter (Signed)
Will need to contact [facility] tomorrow since it is after 5pm.  ?

## 2022-02-28 NOTE — Telephone Encounter (Signed)
Called and spoke with [facility]. They couldn't get anyone on the phone to give me the status of patient's bipap supplies. She took our number down and said she will have them call us back. ? ?[facility] patient. LVM for patient to call us back.  ?

## 2022-03-06 ENCOUNTER — Telehealth: Payer: Self-pay | Admitting: Pulmonary Disease

## 2022-03-06 NOTE — Telephone Encounter (Signed)
Attempted to reach patient. No answer. Left message with callback number. Will reach out again tomorrow to discuss inhaler options. ?

## 2022-03-06 NOTE — Telephone Encounter (Signed)
Lmtcb for pt.  

## 2022-03-06 NOTE — Telephone Encounter (Signed)
Called and spoke to pt. Pt states she is not comfortable taking inhaled steroids for an extended period of time. Advised pt that an ICS is the treatment for COPD along with the other medications within trelegy. Pt states she feels she is doing better and would like to try not taking Trelegy. Advised pt the reason she likely feels better is because of the inhaler. Pt states she feels the Trelegy is causing her a mild runny nose, with mucus in her throat with throat clearing (clear in color, thin), and her taste has changed. Unsure if pt has seasonal allergies. Pt is not taking an antihistamine. Pt is very adamant that she would like to come off the Trelegy and wants Dr. Cordelia Pen advice.  ? ?Dr. Loanne Drilling, please advise. Thanks.  ?

## 2022-03-07 ENCOUNTER — Encounter: Payer: Self-pay | Admitting: Pulmonary Disease

## 2022-03-07 NOTE — Telephone Encounter (Signed)
Attempted to call again. No answer. Left VM with callback number. ? ?If patient/family returns call, would recommend Stiolto TWO puffs ONCE a day. This inhaler does not have a steroid but still would have two medications to help with her symptoms. ? ? ?

## 2022-03-10 ENCOUNTER — Telehealth: Payer: Self-pay | Admitting: Pulmonary Disease

## 2022-03-10 NOTE — Telephone Encounter (Signed)
Please advise on mychart.  ?

## 2022-03-11 ENCOUNTER — Telehealth: Payer: Self-pay | Admitting: *Deleted

## 2022-03-11 ENCOUNTER — Ambulatory Visit (INDEPENDENT_AMBULATORY_CARE_PROVIDER_SITE_OTHER): Payer: Medicare PPO | Admitting: Adult Health

## 2022-03-11 ENCOUNTER — Encounter: Payer: Self-pay | Admitting: Adult Health

## 2022-03-11 DIAGNOSIS — J9611 Chronic respiratory failure with hypoxia: Secondary | ICD-10-CM | POA: Diagnosis not present

## 2022-03-11 DIAGNOSIS — J9612 Chronic respiratory failure with hypercapnia: Secondary | ICD-10-CM | POA: Diagnosis not present

## 2022-03-11 DIAGNOSIS — E43 Unspecified severe protein-calorie malnutrition: Secondary | ICD-10-CM

## 2022-03-11 DIAGNOSIS — J449 Chronic obstructive pulmonary disease, unspecified: Secondary | ICD-10-CM

## 2022-03-11 DIAGNOSIS — R5381 Other malaise: Secondary | ICD-10-CM

## 2022-03-11 DIAGNOSIS — J9602 Acute respiratory failure with hypercapnia: Secondary | ICD-10-CM

## 2022-03-11 NOTE — Telephone Encounter (Signed)
There was a telephone note on 01/31/22 approving to order ONO. Please check if this was already ordered and completed first. If not, OK to order another ONO. ?

## 2022-03-11 NOTE — Telephone Encounter (Signed)
Called Brad from [facility] and he states for patient to get approved for Bipap machine we need an order of ONO on oxygen. ? ?Dr Loanne Drilling are you ok with [place] placing this ONO order to get her approved for her Bipap machine. ? ?Please advise  ?

## 2022-03-11 NOTE — Telephone Encounter (Signed)
Melissa called back and I talked Tammy and she updated the office visit and Lenna Sciara is going to work on the ONO and she will need a order got put in for 2 Liters of oxygen. Per [employer] will not cover since the ONO was on room air and for insurance to pay the paitent will have to repeat a test on oxygen to keep her insurance paying for Bipap. I spoke with Tammy Parrett and the patient does not need a ONO on oxygen. I am waiting on a call back.  ?

## 2022-03-11 NOTE — Addendum Note (Signed)
Addended by: Dessie Coma on: 03/11/2022 03:32 PM ? ? Modules accepted: Orders ? ?

## 2022-03-11 NOTE — Assessment & Plan Note (Signed)
High-protein diet 

## 2022-03-11 NOTE — Assessment & Plan Note (Addendum)
Chronic hypoxic and hypercarbic respiratory failure.  Patient has previously been documented with severe hypercarbia with a PCO2 of 102.  She has high risk for decompensation.  Recent overnight oximetry test shows significant hypoxemia with SPO2 less than 88% 5 hours and 18 minutes with SPO2 low at 72%. ?Despite insurance and DME denial of coverage highly recommend continuing BiPAP with oxygen at bedtime to help prevent further further decompensation and hospitalizations ? ?Plan  ?Patient Instructions  ?Call back if your symptoms flare off maintenance inhaler.  ?Albuterol inhaler As needed   ?Activity as tolerated ?Continue on BIPAP At bedtime with Oxygen and with naps ?Follow up with Dr. Loanne Drilling next month as planned.  ?Please contact office for sooner follow up if symptoms do not improve or worsen or seek emergency care  ? ?  ? ?

## 2022-03-11 NOTE — Telephone Encounter (Signed)
Patient checking on denial for BIPAP machine. Patient phone number is [phone]. ?

## 2022-03-11 NOTE — Assessment & Plan Note (Signed)
Activity as tolerated

## 2022-03-11 NOTE — Telephone Encounter (Signed)
Called and spoke with patient. She has an appointment with Dr. Loanne Drilling today to discuss the stiolto inhaler. Nothing further needed. ?

## 2022-03-11 NOTE — Progress Notes (Signed)
? ?@Patient  ID: Victoria Rice, female    DOB: [birth year], 88 y.o.   MRN: [id number] ? ?Chief Complaint  ?Patient presents with  ? Follow-up  ?  Pt is here in regards to Bipap machine. She states that she got a call from her insurance that they weren't approving her Bipap machine any longer due to non medical necessity any longer.  ? ? ?Referring provider: ?Faustino Congress, NP ? ?HPI: ?88 year old female former smoker seen for pulmonary consult October 2020 for COPD.  Found to have severe COPD.  Chronic hypoxic and hypercarbic respiratory failure started on oxygen and BiPAP January 2023. ?Medical history significant for breast cancer status postlumpectomy (no chemo or radiation), history of carcinoid tumor of the lung diagnosed in 2017 ? ?TEST/EVENTS :  ?PFT 07/18/16 >> FEV1 0.6 (42%), FEV1% 48, TLC 5.06 (117%), DLCO 80%, +BD ?Echo 09/26/19 >> EF 60 to 65% ?CT chest 09/10/21 >> smooth margin ovoid nodule LLL 1.9 x 1.0 cm, multiple smaller nodules stable, scattered cylindrical and varicose BTX mostly in RML and lingula ? ?Hospitalization with acute on chronic hypercarbic and hypoxic respiratory failure with PCO2 at 106 and pH at 7.218--treated with BiPAP support, IV antibiotics steroids and nebulized bronchodilators.  She is also diuresed for decompensated heart failure.  Started on oxygen 2 L at discharge.  Completed rehab at [facility]  ? ?03/11/2022 Follow up : COPD, chronic hypoxic and hypercarbic respiratory failure ?Patient returns for 6-week follow-up.  Patient has underlying severe COPD and chronic hypoxic and hypercarbic respiratory failure.  Last visit patient was slowly getting better from hospitalization in January 2023 with acute hypoxic and hypercarbic respiratory failure with COPD exacerbation and decompensated heart failure.  Patient was discharged on oxygen and nocturnal BiPAP with oxygen.  Patient did complete rehab at a SNF.  Patient says she is slowly been getting stronger.  Has now weaned  herself of all oxygen during the daytime.  Says that her oxygen levels are remaining at 95 to 96% during the daytime.  She says overall breathing is been doing okay.  She has stopped Trelegy over the last 1 to 2 weeks.  Patient says that she has had trouble tolerating inhalers in the past.  She believes she has tried Stiolto in the past and Combivent and does not like the side effects.  We had a long discussion regarding her severity of her COPD also with recent exacerbation highly recommend a maintenance inhaler.  Patient says at this time she does not want to take a maintenance inhaler she is actually been doing well without taking Trelegy.  She says it causes her to have joint aches various other symptoms and side effects.  We talked about switching to a nonsteroidal inhaler or an inhaler with a spacer however patient declines at this time. ? ?Patient also is complaining that her insurance company and DME company tell her that they are not going to pay for her BiPAP.  She was recommended to have an overnight oximetry test.  This was completed on February 17, 2022.  This showed significant hypoxemia with 5 hours and 18 minutes and 56 seconds spent below 88%.  This was completed on room air.  Unclear the reasoning of insurance and DME qualifications for coverage.  Patient has significant hypercarbia with a PCO2 of 106 and a peak AHI at 7.2 during hospitalization BiPAP was used and patient was able to avoid intubation due to this. ?As above patient has severe COPD with an FEV1 at 42% and  a ratio at 48 which classifies her in the severe range of COPD and this was completed almost 6 years ago in 2017.  As above patient is very high risk for decompensation and hospitalization due to her severe underlying COPD and documented hypercarbia therefore would continue on BiPAP support with oxygen at bedtime.  Since starting BiPAP at bedtime patient feels significantly improved has a clear head less confusion.  She has had no  flare of her COPD over the last few weeks.  BiPAP download over the last week shows that patient used her BiPAP each night for approximately 7 hours.  AHI was 2.6/hour.  Patient is on BiPAP ST with IPAP max at 18 and EPAP minimum at 6 and respiratory rate 20 breaths/min ?Last visit patient was recommended changed to a DreamWear nasal.  Patient says she never received this.  She is currently using her full facemask which she says is starting to work well for her and she is tolerating.  She says her homecare company did drop off a mask and told her that it was probably too small and she has not tried it.  Advised her to bring it to her next visit so we can evaluate it ? ? ? ? ?Allergies  ?Allergen Reactions  ? Actonel [Risedronate Sodium] Other (See Comments)  ?  indigestion  ? ? ?Immunization History  ?Administered Date(s) Administered  ? Fluad Quad(high Dose 65+) 09/06/2021  ? Influenza Split 09/12/2020  ? Influenza, High Dose Seasonal PF 09/25/2017, 09/28/2018, 09/20/2019, 08/22/2021  ? Influenza,inj,Quad PF,6+ Mos 10/15/2016, 09/16/2019  ? PFIZER(Purple Top)SARS-COV-2 Vaccination 01/01/2020, 01/23/2020, 09/22/2020  ? Pneumococcal Conjugate-13 05/03/2015  ? Tdap 07/21/2010  ? ? ?Past Medical History:  ?Diagnosis Date  ? Breast cancer ([place])   ? lumpectomy, no chemo or XRT; 1 year of anastrazole  ? Cataract   ? COPD, severe ([place]) 07/31/2016  ? Family history of adverse reaction to anesthesia   ? " my sisiter was unable to move for a while with the spinal anesthesia."  ? Hypothyroidism   ? nodule   ? Neuroendocrine carcinoma of lung ([place]) 10/26/2016  ? Nodule of left lung   ? lower lobe  ? Pneumonia   ? as an infant  ? Sebaceous cyst of labia 08/18/2018  ? Shortness of breath dyspnea   ? recently increased   ? Skin cancer   ? nose- tx. [facility]   ? Thyroid nodule   ? Vaginal delivery   ? x2 /w  one being breech  ? ? ?Tobacco History: ?Social History  ? ?Tobacco Use  ?Smoking Status Former  ? Packs/day: 0.75  ?  Years: 20.00  ? Pack years: 15.00  ? Types: Cigarettes  ? Quit date: 05/08/1974  ? Years since quitting: 47.8  ?Smokeless Tobacco Never  ? ?Counseling given: Not Answered ? ? ?Outpatient Medications Prior to Visit  ?Medication Sig Dispense Refill  ? albuterol (VENTOLIN HFA) 108 (90 Base) MCG/ACT inhaler Inhale 1-2 puffs into the lungs every 6 (six) hours as needed for wheezing or shortness of breath.    ? ALPRAZolam (XANAX) 0.25 MG tablet Take 1 tablet (0.25 mg total) by mouth at bedtime as needed for anxiety. 30 tablet 0  ? amLODipine (NORVASC) 2.5 MG tablet Take 2.5 mg by mouth daily.    ? cholecalciferol (VITAMIN D) 1000 units tablet Take 1,000 Units by mouth daily.    ? Fluticasone-Umeclidin-Vilant (TRELEGY ELLIPTA) 200-62.5-25 MCG/ACT AEPB Inhale 1 puff into the lungs daily. Juda  each 11  ? furosemide (LASIX) 40 MG tablet Take 1 tablet (40 mg total) by mouth daily as needed. 30 tablet 2  ? levothyroxine (SYNTHROID, LEVOTHROID) 50 MCG tablet Take 50 mcg by mouth daily before breakfast.    ? naproxen sodium (ANAPROX) 220 MG tablet Take 220 mg by mouth 2 (two) times daily as needed (for pain.).    ? pantoprazole (PROTONIX) 40 MG tablet Take 1 tablet (40 mg total) by mouth daily.    ? polyethylene glycol (MIRALAX / GLYCOLAX) 17 g packet Take 17 g by mouth daily as needed for mild constipation. 14 each 0  ? senna-docusate (SENOKOT-S) 8.6-50 MG tablet Take 1 tablet by mouth 2 (two) times daily.    ? sodium chloride 1 g tablet Take 1 tablet (1 g total) by mouth 2 (two) times daily with a meal.    ? traZODone (DESYREL) 50 MG tablet Take 1 tablet (50 mg total) by mouth at bedtime. 30 tablet 0  ? ?No facility-administered medications prior to visit.  ? ? ? ?Review of Systems:  ? ?Constitutional:   No  weight loss, night sweats,  Fevers, chills, ?+ fatigue, or  lassitude. ? ?HEENT:   No headaches,  Difficulty swallowing,  Tooth/dental problems, or  Sore throat,  ?              No sneezing, itching, ear ache, nasal congestion,  post nasal drip,  ? ?CV:  No chest pain,  Orthopnea, PND, swelling in lower extremities, anasarca, dizziness, palpitations, syncope.  ? ?GI  No heartburn, indigestion, abdominal pain, nausea, vomiting, diar

## 2022-03-11 NOTE — Assessment & Plan Note (Addendum)
Severe COPD with previous FEV1 at 42%.  Patient is high risk for decompensation.  Had a exacerbation in January 2023.  Highly recommend a maintenance inhaler unfortunately patient has multiple side effects and intolerance.  At this time she declines maintenance inhaler.  Advised to use albuterol as needed.  Long discussion with patient regarding increased symptoms to please call back for work in visit. ?Would consider simple spirometry if patient is able to complete on return ? ?Plan  ?Patient Instructions  ?Call back if your symptoms flare off maintenance inhaler.  ?Albuterol inhaler As needed   ?Activity as tolerated ?Continue on BIPAP At bedtime with Oxygen and with naps ?Follow up with Dr. Loanne Drilling next month as planned.  ?Please contact office for sooner follow up if symptoms do not improve or worsen or seek emergency care  ? ?  ? ?

## 2022-03-11 NOTE — Telephone Encounter (Signed)
Tammy spoke with the patient and she states she already had an ONO done. ? ?Spoke with Leroy Sea and let him know that she already had an ONO done on 3/9 and we needed the results faxed to [place] today.  Results faxed to our front office fax and placed on Tammy Parrett NP's desk for review.  Nothing further needed. ?

## 2022-03-11 NOTE — Telephone Encounter (Signed)
Please schedule in-person or video visit. Ok to used blocked times for this week. If beyond this week, please message me first ?

## 2022-03-11 NOTE — Patient Instructions (Addendum)
Call back if your symptoms flare off maintenance inhaler.  ?Albuterol inhaler As needed   ?Activity as tolerated ?Continue on BIPAP At bedtime with Oxygen and with naps ?Follow up with Dr. Loanne Drilling next month as planned.  ?Please contact office for sooner follow up if symptoms do not improve or worsen or seek emergency care  ? ?

## 2022-03-11 NOTE — Telephone Encounter (Signed)
I called the Victoria Rice and scheduled appt with Dr Loanne Drilling for today at 11:45.  ?

## 2022-03-11 NOTE — Addendum Note (Signed)
Addended by: Dessie Coma on: 03/11/2022 03:12 PM ? ? Modules accepted: Orders ? ?

## 2022-03-11 NOTE — Telephone Encounter (Signed)
ATC Brad with [employer] regarding patient's bipap.  LVM to return call and that we need a download for the past 30 days as I cannot get airview to print out a 30 day compliance record.  I asked that he call me back as patient is in office stating that insurance will not cover her bipap and that she needs an ONO on oxygen.  I asked him to call me back for clarification. ? ?I spoke with Leroy Sea and he stated that the patient was set up in the hospital with the Bipap and oxygen and they had not been billing for it because she needed an ONO on oxygen.  I asked if she needed to be on the Bipap as well.  He was reading from the notes and does not usually deal with this kind of thing and deferred to [facility] with [employer].  Rexene Edison  NP spoke with Leroy Sea as well and questioned dong an ONO on oxygen as it would show normal oxygen levels and she did not feel that was the proper procedure to qualify her for the Bipap with oxygen.  She asked that [facility] check with [facility] and get back with me before we order the ONO so we order it correctly so patient will keep her Bipap and oxygen.  Will await return call for specifics on order for ONO. ?

## 2022-03-17 ENCOUNTER — Telehealth: Payer: Self-pay | Admitting: Adult Health

## 2022-03-17 NOTE — Telephone Encounter (Signed)
Patient is returning phone call. Patient phone number is [phone]. ?

## 2022-03-17 NOTE — Telephone Encounter (Signed)
ATC patient. Patient did not answer. Left VM for patient to call us back.  ?

## 2022-03-17 NOTE — Telephone Encounter (Signed)
Called and spoke with patient. Patient wanted to let us know that she wanted to stay on trelegy and not switch to anything different. Nothing further needed.  ?

## 2022-03-20 ENCOUNTER — Telehealth: Payer: Self-pay | Admitting: Pulmonary Disease

## 2022-03-20 NOTE — Telephone Encounter (Signed)
Called and spoke with the pt  ?She states that she received a letter from her insurance that her BIPAP is not covered ?I called [employer] and spoke with Colletta Maryland and was advised they will check with the billing dept and then call us back  ?Will update the pt once we hear back  ? ?

## 2022-03-24 NOTE — Telephone Encounter (Addendum)
Called and spoke with Melissa at [employer] to see if should could tell me if patient has BIPAP or oxygen with them. She states that there is a lot going on with this patient and that she has BIPAP through them but is not sure about oxygen. She states that patient was at a SNF and was put on BIPAP while at the facility and she believes that the facility has a contract with [employer]. Patient was then discharged from the SNF and sent home with the machine. She states that insurance was requiring for patient to have an ONO done with oxygen to "qualify" and them pay for it. Patient did ONO but it was on room air. Another ONO has been ordered on 2 liters oxygen and they are still waiting for that to be done. Melissa said she is going to work on getting an update on the status of that.  ? ?Called and spoke with [employer] and they confirmed that patient does have O2 and BIPAP through them. I called and spoke with the patient to confirm that this is for sure her DME company. She confirmed everything is from [employer] she provided me with name and number of who her and her son have been working with. She states that its [employer] in [place] and they have been working with Nira Conn Tincaid and her number is [phone]. I called Melissa back at [employer] and let her know all of the above information and she expressed understanding. She states maybe that's why they have been having trouble getting things processed and approved. She states that she is going to cancel everything on their end for this patient and nothing further will be processed with them. I have updated patient [facility] note that she uses [employer] ([employer]). Nothing further needed at this time. ?

## 2022-03-27 ENCOUNTER — Telehealth: Payer: Self-pay | Admitting: Pulmonary Disease

## 2022-03-27 DIAGNOSIS — J449 Chronic obstructive pulmonary disease, unspecified: Secondary | ICD-10-CM

## 2022-03-27 NOTE — Telephone Encounter (Signed)
[facility] Telephone Encounter ? ?Overnight Oximetry performed on 02/17/22.  ?SpO2 <88% 5h 72min 56 sec. Nadir SpO2 72% ? ?Staff - please order 3L O2 nightly ?

## 2022-03-31 ENCOUNTER — Ambulatory Visit: Payer: Medicare PPO | Admitting: Pulmonary Disease

## 2022-03-31 NOTE — Telephone Encounter (Signed)
O2 ordered nothing further  ?

## 2022-04-22 ENCOUNTER — Encounter: Payer: Self-pay | Admitting: Pulmonary Disease

## 2022-04-22 ENCOUNTER — Ambulatory Visit: Payer: Medicare PPO | Admitting: Pulmonary Disease

## 2022-04-22 VITALS — BP 100/50 | HR 75 | Ht 59.0 in | Wt 104.2 lb

## 2022-04-22 DIAGNOSIS — M7989 Other specified soft tissue disorders: Secondary | ICD-10-CM

## 2022-04-22 DIAGNOSIS — J9612 Chronic respiratory failure with hypercapnia: Secondary | ICD-10-CM

## 2022-04-22 DIAGNOSIS — J9611 Chronic respiratory failure with hypoxia: Secondary | ICD-10-CM | POA: Diagnosis not present

## 2022-04-22 DIAGNOSIS — J449 Chronic obstructive pulmonary disease, unspecified: Secondary | ICD-10-CM | POA: Diagnosis not present

## 2022-04-22 MED ORDER — FUROSEMIDE 40 MG PO TABS
20.0000 mg | ORAL_TABLET | Freq: Every day | ORAL | 2 refills | Status: DC | PRN
Start: 2022-04-22 — End: 2022-11-07

## 2022-04-22 NOTE — Patient Instructions (Addendum)
COPD/Emphysema, GOLD D ?--CONTINUE Trelegy 200-62.5-25 mcg ONE puff ONCE day ?--CONTINUE Albuterol AS NEEDED for shortness of breath or wheezing ? ?Chronic hypoxemic and hypercapneic respiratory failure ?Patient uses NIV for more than four hours nightly for more then 4 hours per night on 70% of nights during the last three months of usage. ?--CONTINUE 1L O2 on portable oxygen with activity for SpO2 >88% ?--CONTINUE NIV nightly ? ?Leg Swelling - improved ?--Decrease lasix 20 mg daily. If increased swelling or weight gain (>3lbs daily or >5lbs weekly), can take additional 20 mg pill ?--ORDER BMET ? ?Follow-up with me in 4 months ?

## 2022-04-22 NOTE — Progress Notes (Signed)
? ? ?Subjective:  ? ?PATIENT ID: Victoria Rice GENDER: female DOB: Aug 25, 1931, MRN: [id number] ? ? ?HPI ? ?Chief Complaint  ?Patient presents with  ? Follow-up  ?  Bipap complicance ?copd  ? ? ?Reason for Visit: Follow-up ? ?Victoria Rice is a 88 year old female with COPD/emphysema, history of neuroendocrine carcinoma in 2017, bronchiectasis, hx stage IA left breast cancer s/p lumpectomy in 2017 who presents for follow-up. ? ?Synopsis: ?She was previously seen at [facility] with Dr. Loletta Specter with the last clinic visit on 10/04/19. She was seen in hospital follow-up on 01/08/22 with NP Parrett. She was admitted for acute hypoxemic and hypercarbic respiratory failure with PCO2 106, treated with NIV, antibiotics, bronchodilators, steroids and oxygen. She required 2L at discharge. She recently completed rehab at [place]. Currently on Trelegy, home oxygen and BiPAP. At baseline she is able to walk and only uses walker as needed for long distances or rough terrain.  ? ?01/21/22 ?She is compliant with oxygen with activity and sleeps with BiPAP for 4-6 hours nightly. Currently wearing full face mask and awaiting nasal prongs with dreamwear. Has been compliant Trelegy with improvement in her symptoms. Rarely uses albuterol. Has some shortness of breath with moderate/heavy exertion. Denies chronic cough, congestion. Occasional wheezing.  ? ?04/22/22 ?She reports nasal congestion that will contribute to cough at times. She is compliant with Trelegy and NIV nightly. She has not wearing as often due to feeling better and seeing improved numbers. Denies shortness of breath or wheezing. Daughter is present. ? ?Social History: ?Former smoker. Quit in 1975.  ? ?Past Medical History:  ?Diagnosis Date  ? Breast cancer ([place])   ? lumpectomy, no chemo or XRT; 1 year of anastrazole  ? Cataract   ? COPD, severe ([place]) 07/31/2016  ? Family history of adverse reaction to anesthesia   ? " my sisiter was unable to move for a while with  the spinal anesthesia."  ? Hypothyroidism   ? nodule   ? Neuroendocrine carcinoma of lung ([place]) 10/26/2016  ? Nodule of left lung   ? lower lobe  ? Pneumonia   ? as an infant  ? Sebaceous cyst of labia 08/18/2018  ? Shortness of breath dyspnea   ? recently increased   ? Skin cancer   ? nose- tx. with [facility]   ? Thyroid nodule   ? Vaginal delivery   ? x2 /w  one being breech  ?  ? ?Family History  ?Problem Relation Age of Onset  ? Prostate cancer Brother   ? Lung cancer Brother   ? Cystic fibrosis Cousin   ?  ? ?Social History  ? ?Occupational History  ? Not on file  ?Tobacco Use  ? Smoking status: Former  ?  Packs/day: 0.75  ?  Years: 20.00  ?  Pack years: 15.00  ?  Types: Cigarettes  ?  Quit date: 05/08/1974  ?  Years since quitting: 47.9  ? Smokeless tobacco: Never  ?Vaping Use  ? Vaping Use: Never used  ?Substance and Sexual Activity  ? Alcohol use: Yes  ?  Alcohol/week: 6.0 standard drinks  ?  Types: 6 Glasses of wine per week  ?  Comment: social  ? Drug use: No  ? Sexual activity: Never  ? ? ?Allergies  ?Allergen Reactions  ? Actonel [Risedronate Sodium] Other (See Comments)  ?  indigestion  ?  ? ?Outpatient Medications Prior to Visit  ?Medication Sig Dispense Refill  ? cholecalciferol (VITAMIN D)  1000 units tablet Take 1,000 Units by mouth daily.    ? enalapril (VASOTEC) 2.5 MG tablet Take by mouth.    ? Fluticasone-Umeclidin-Vilant (TRELEGY ELLIPTA) 200-62.5-25 MCG/ACT AEPB Inhale 1 puff into the lungs daily. 60 each 11  ? levothyroxine (SYNTHROID, LEVOTHROID) 50 MCG tablet Take 50 mcg by mouth daily before breakfast.    ? naproxen sodium (ANAPROX) 220 MG tablet Take 220 mg by mouth 2 (two) times daily as needed (for pain.).    ? senna-docusate (SENOKOT-S) 8.6-50 MG tablet Take 1 tablet by mouth 2 (two) times daily.    ? traZODone (DESYREL) 50 MG tablet Take 1 tablet (50 mg total) by mouth at bedtime. 30 tablet 0  ? furosemide (LASIX) 40 MG tablet Take 1 tablet (40 mg total) by mouth daily as needed. 30  tablet 2  ? albuterol (VENTOLIN HFA) 108 (90 Base) MCG/ACT inhaler Inhale 1-2 puffs into the lungs every 6 (six) hours as needed for wheezing or shortness of breath. (Patient not taking: Reported on 04/22/2022)    ? ALPRAZolam (XANAX) 0.25 MG tablet Take 1 tablet (0.25 mg total) by mouth at bedtime as needed for anxiety. (Patient not taking: Reported on 04/22/2022) 30 tablet 0  ? amLODipine (NORVASC) 2.5 MG tablet Take 2.5 mg by mouth daily. (Patient not taking: Reported on 04/22/2022)    ? pantoprazole (PROTONIX) 40 MG tablet Take 1 tablet (40 mg total) by mouth daily. (Patient not taking: Reported on 04/22/2022)    ? polyethylene glycol (MIRALAX / GLYCOLAX) 17 g packet Take 17 g by mouth daily as needed for mild constipation. (Patient not taking: Reported on 04/22/2022) 14 each 0  ? sodium chloride 1 g tablet Take 1 tablet (1 g total) by mouth 2 (two) times daily with a meal. (Patient not taking: Reported on 04/22/2022)    ? ?No facility-administered medications prior to visit.  ? ? ?Review of Systems  ?Constitutional:  Negative for chills, diaphoresis, fever, malaise/fatigue and weight loss.  ?HENT:  Negative for congestion.   ?Respiratory:  Negative for cough, hemoptysis, sputum production, shortness of breath and wheezing.   ?Cardiovascular:  Negative for chest pain, palpitations and leg swelling.  ? ? ?Objective:  ? ?Vitals:  ? 04/22/22 1334  ?BP: (!) 100/50  ?Pulse: 75  ?SpO2: 94%  ?Weight: 104 lb 3.2 oz (47.3 kg)  ?Height: 4\' 11"  (1.499 m)  ? ?SpO2: 94 % ?O2 Device: None (Room air) ? ?Physical Exam: ?General: Elderly, frail-appearing, no acute distress ?HENT: , AT ?Eyes: EOMI, no scleral icterus ?Respiratory: Clear to auscultation bilaterally.  No crackles, wheezing or rales ?Cardiovascular: RRR, -M/R/G, no JVD ?Extremities:-Edema,-tenderness ?Neuro: AAO x4, CNII-XII grossly intact ?Psych: Normal mood, normal affect ? ?Data Reviewed: ? ?Imaging: ?CT chest 09/01/2019-mosaicism, emphysema, prominent airway  thickening.  Metallic fiducial marker in left lower lobe.  Nodule in inferior lingula bronchiectasis in the right middle lobe with associated peripheral nodules. ? ?PFT: ?07/18/16 ?FVC 1.24 (65%) FEV1 0.60 (42%) Ratio 49  TLC 117% RV 170% RV/TLC 151% DLCO 80% ?Interpretation: Severe obstructive defect with significant bronchodilator response. Air trapping present. ? ?Labs: ? ?ABG ?   ?Component Value Date/Time  ? PHART 7.372 12/21/2021 1415  ? PCO2ART 77.5 (HH) 12/21/2021 1415  ? PO2ART 70.0 (L) 12/21/2021 1415  ? HCO3 50.0 (H) 12/25/2021 0935  ? O2SAT 59.5 12/25/2021 0935  ? ?BIPAP Compliance 03/24/22-04/22/22 ?Usage days 29/30 days (97%) ?>4 hours 29 days (97%) ?IPAP 18 EPAP 6 RR 20 ?   ?Assessment & Plan:  ? ?  Discussion: ?88 year old female with COPD/emphysema, history of neuroendocrine carcinoma in 2017, bronchiectasis, hx stage IA left breast cancer s/p lumpectomy in 2017 who presents as a new consult for COPD. She was recently hospitalized but improving from a functional standpoint after rehab. Continues to require oxygen with ambulation. She is on max bronchodilator therapy and NIV. If symptoms worsen in the future will add neb treatment for support. ? ?87 year old female with COPD/emphysema, bronchiectasis, history of neuroendocrine carcinoma in 2017, hx stage IA left breast cancer s/p lumpectomy in 2017 who presents for follow-up. ? ?COPD/Emphysema, GOLD D ?--CONTINUE Trelegy 200-62.5-25 mcg ONE puff ONCE day ?--CONTINUE Albuterol AS NEEDED for shortness of breath or wheezing ? ?Chronic hypoxemic and hypercapneic respiratory failure ?Patient uses NIV for more than four hours nightly for more then 4 hours per night on 70% of nights during the last three months of usage. ?--CONTINUE 1L O2 on portable oxygen with activity for SpO2 >88% ?--CONTINUE NIV nightly ? ?Patient continues to exhibit signs of hypercapnia associated with chronic respiratory failures secondary to severe COPD. Patient requires the use of NIV  both QHS and daytime to help with exacerbation periods. The use of the NIV will treat patient's high PCO2 levels and can reduce risk of exacerbations and future hospitalizations when used at nigh and during th

## 2022-04-23 ENCOUNTER — Other Ambulatory Visit (INDEPENDENT_AMBULATORY_CARE_PROVIDER_SITE_OTHER): Payer: Medicare PPO

## 2022-04-23 DIAGNOSIS — J9611 Chronic respiratory failure with hypoxia: Secondary | ICD-10-CM | POA: Diagnosis not present

## 2022-04-23 DIAGNOSIS — J9612 Chronic respiratory failure with hypercapnia: Secondary | ICD-10-CM

## 2022-04-23 DIAGNOSIS — M7989 Other specified soft tissue disorders: Secondary | ICD-10-CM | POA: Diagnosis not present

## 2022-04-23 LAB — BASIC METABOLIC PANEL
BUN: 19 mg/dL (ref 6–23)
CO2: 34 mEq/L — ABNORMAL HIGH (ref 19–32)
Calcium: 9.5 mg/dL (ref 8.4–10.5)
Chloride: 98 mEq/L (ref 96–112)
Creatinine, Ser: 0.86 mg/dL (ref 0.40–1.20)
GFR: 60.26 mL/min (ref >60.00)
Glucose, Bld: 100 mg/dL — ABNORMAL HIGH (ref 70–99)
Potassium: 3.9 mEq/L (ref 3.5–5.1)
Sodium: 138 mEq/L (ref 135–145)

## 2022-06-17 DIAGNOSIS — Z1389 Encounter for screening for other disorder: Secondary | ICD-10-CM | POA: Diagnosis not present

## 2022-06-17 DIAGNOSIS — Z0000 Encounter for general adult medical examination without abnormal findings: Secondary | ICD-10-CM | POA: Diagnosis not present

## 2022-06-17 DIAGNOSIS — Z23 Encounter for immunization: Secondary | ICD-10-CM | POA: Diagnosis not present

## 2022-06-25 DIAGNOSIS — J449 Chronic obstructive pulmonary disease, unspecified: Secondary | ICD-10-CM | POA: Diagnosis not present

## 2022-06-25 DIAGNOSIS — Z79899 Other long term (current) drug therapy: Secondary | ICD-10-CM | POA: Diagnosis not present

## 2022-06-25 DIAGNOSIS — I872 Venous insufficiency (chronic) (peripheral): Secondary | ICD-10-CM | POA: Diagnosis not present

## 2022-06-25 DIAGNOSIS — E7800 Pure hypercholesterolemia, unspecified: Secondary | ICD-10-CM | POA: Diagnosis not present

## 2022-06-25 DIAGNOSIS — E559 Vitamin D deficiency, unspecified: Secondary | ICD-10-CM | POA: Diagnosis not present

## 2022-06-25 DIAGNOSIS — E039 Hypothyroidism, unspecified: Secondary | ICD-10-CM | POA: Diagnosis not present

## 2022-06-25 DIAGNOSIS — I10 Essential (primary) hypertension: Secondary | ICD-10-CM | POA: Diagnosis not present

## 2022-06-25 DIAGNOSIS — D3A090 Benign carcinoid tumor of the bronchus and lung: Secondary | ICD-10-CM | POA: Diagnosis not present

## 2022-07-29 DIAGNOSIS — R0602 Shortness of breath: Secondary | ICD-10-CM | POA: Diagnosis not present

## 2022-07-29 DIAGNOSIS — R0989 Other specified symptoms and signs involving the circulatory and respiratory systems: Secondary | ICD-10-CM | POA: Diagnosis not present

## 2022-08-13 DIAGNOSIS — Z961 Presence of intraocular lens: Secondary | ICD-10-CM | POA: Diagnosis not present

## 2022-08-13 DIAGNOSIS — H43391 Other vitreous opacities, right eye: Secondary | ICD-10-CM | POA: Diagnosis not present

## 2022-08-13 DIAGNOSIS — H04123 Dry eye syndrome of bilateral lacrimal glands: Secondary | ICD-10-CM | POA: Diagnosis not present

## 2022-08-13 DIAGNOSIS — H2512 Age-related nuclear cataract, left eye: Secondary | ICD-10-CM | POA: Diagnosis not present

## 2022-08-13 DIAGNOSIS — H5703 Miosis: Secondary | ICD-10-CM | POA: Diagnosis not present

## 2022-08-27 ENCOUNTER — Encounter: Payer: Self-pay | Admitting: Pulmonary Disease

## 2022-08-27 ENCOUNTER — Ambulatory Visit: Payer: Medicare PPO | Admitting: Pulmonary Disease

## 2022-08-27 VITALS — BP 112/70 | HR 82 | Ht 59.0 in | Wt 105.4 lb

## 2022-08-27 DIAGNOSIS — J9612 Chronic respiratory failure with hypercapnia: Secondary | ICD-10-CM | POA: Diagnosis not present

## 2022-08-27 DIAGNOSIS — J449 Chronic obstructive pulmonary disease, unspecified: Secondary | ICD-10-CM

## 2022-08-27 DIAGNOSIS — J9611 Chronic respiratory failure with hypoxia: Secondary | ICD-10-CM

## 2022-08-27 MED ORDER — TIOTROPIUM BROMIDE-OLODATEROL 2.5-2.5 MCG/ACT IN AERS: 2.0000 | INHALATION_SPRAY | Freq: Every day | RESPIRATORY_TRACT | 0 refills | Status: DC

## 2022-08-27 MED ORDER — STIOLTO RESPIMAT 2.5-2.5 MCG/ACT IN AERS: 2.0000 | INHALATION_SPRAY | Freq: Every day | RESPIRATORY_TRACT | 2 refills | Status: DC

## 2022-08-27 NOTE — Patient Instructions (Addendum)
COPD/Emphysema, GOLD D --STOP Trelegy --START Stiolto TWO puffs ONCE a day --CONTINUE Albuterol AS NEEDED for shortness of breath or wheezing  Chronic hypoxemic and hypercapneic respiratory failure Patient uses NIV for more than four hours nightly for at least 70% of nights during the last three months of usage. The patient has been using and benefiting from PAP use and will continue to benefit from therapy.  --CONTINUE 1L O2 on portable oxygen with activity for SpO2 >88% --CONTINUE NIV nightly  Follow-up with me in 2 months

## 2022-08-27 NOTE — Progress Notes (Signed)
Subjective:   PATIENT ID: [name] GENDER: female DOB: 1930/12/17, MRN: [id number]   HPI  Chief Complaint  Patient presents with   Follow-up    Bipap compliance    Reason for Visit: Follow-up  [name] is a 88 year old female with COPD/emphysema, history of neuroendocrine carcinoma in 2017, bronchiectasis, hx stage IA left breast cancer s/p lumpectomy in 2017 who presents for follow-up.  Synopsis: She was previously seen at [facility] with Dr. Carlis Abbott with the last clinic visit on 10/04/19. She was hospitalized in 12/2021 for acute hypoxemic and hypercarbic respiratory failure with PCO2 106, treated with NIV, antibiotics, bronchodilators, steroids and oxygen. She required 2L at discharge. She completed rehab at [facility]. Currently on Trelegy, home oxygen and BiPAP. At baseline she is able to walk and only uses walker as needed for long distances or rough terrain.   2023 -Started on NIV, Trelegy and O2 after Jan hospitalization. Re-established care with [facility]. No further exacerbations  08/27/22 Daughter is present. Overall she feels is doing well. She is compliant with her NIV. Wears oxygen as needed but has not noticed low numbers like she did in the past. She is compliant with Trelegy inhaler but it affects her taste in food. Has shortness of breath with heavy exertion. Uses walker. Denies cough or wheezing. No exacerbations in >6 months since her last hospitalization Feb 2023.  Social History: Former smoker. Quit in 1975.   Past Medical History:  Diagnosis Date   Breast cancer ([place])    lumpectomy, no chemo or XRT; 1 year of anastrazole   Cataract    COPD, severe ([place]) 07/31/2016   Family history of adverse reaction to anesthesia    " my sisiter was unable to move for a while with the spinal anesthesia."   Hypothyroidism    nodule    Neuroendocrine carcinoma of lung ([place]) 10/26/2016   Nodule of left lung    lower lobe   Pneumonia    as an  infant   Sebaceous cyst of labia 08/18/2018   Shortness of breath dyspnea    recently increased    Skin cancer    nose- tx. with [facility]    Thyroid nodule    Vaginal delivery    x2 /w  one being breech     Family History  Problem Relation Age of Onset   Prostate cancer Brother    Lung cancer Brother    Cystic fibrosis Cousin      Social History   Occupational History   Not on file  Tobacco Use   Smoking status: Former    Packs/day: 0.75    Years: 20.00    Total pack years: 15.00    Types: Cigarettes    Quit date: 05/08/1974    Years since quitting: 48.3   Smokeless tobacco: Never  Vaping Use   Vaping Use: Never used  Substance and Sexual Activity   Alcohol use: Yes    Alcohol/week: 6.0 standard drinks of alcohol    Types: 6 Glasses of wine per week    Comment: social   Drug use: No   Sexual activity: Never    Allergies  Allergen Reactions   Actonel [Risedronate Sodium] Other (See Comments)    indigestion     Outpatient Medications Prior to Visit  Medication Sig Dispense Refill   cholecalciferol (VITAMIN D) 1000 units tablet Take 1,000 Units by mouth daily.     enalapril (VASOTEC) 2.5 MG tablet  Take by mouth.     Fluticasone-Umeclidin-Vilant (TRELEGY ELLIPTA) 200-62.5-25 MCG/ACT AEPB Inhale 1 puff into the lungs daily. 60 each 11   furosemide (LASIX) 40 MG tablet Take 0.5 tablets (20 mg total) by mouth daily as needed. 30 tablet 2   levothyroxine (SYNTHROID, LEVOTHROID) 50 MCG tablet Take 50 mcg by mouth daily before breakfast.     senna-docusate (SENOKOT-S) 8.6-50 MG tablet Take 1 tablet by mouth 2 (two) times daily.     albuterol (VENTOLIN HFA) 108 (90 Base) MCG/ACT inhaler Inhale 1-2 puffs into the lungs every 6 (six) hours as needed for wheezing or shortness of breath.     ALPRAZolam (XANAX) 0.25 MG tablet Take 1 tablet (0.25 mg total) by mouth at bedtime as needed for anxiety. 30 tablet 0   amLODipine (NORVASC) 2.5 MG tablet Take 2.5 mg by mouth daily.      naproxen sodium (ANAPROX) 220 MG tablet Take 220 mg by mouth 2 (two) times daily as needed (for pain.). (Patient not taking: Reported on 08/27/2022)     pantoprazole (PROTONIX) 40 MG tablet Take 1 tablet (40 mg total) by mouth daily.     polyethylene glycol (MIRALAX / GLYCOLAX) 17 g packet Take 17 g by mouth daily as needed for mild constipation. 14 each 0   sodium chloride 1 g tablet Take 1 tablet (1 g total) by mouth 2 (two) times daily with a meal.     traZODone (DESYREL) 50 MG tablet Take 1 tablet (50 mg total) by mouth at bedtime. 30 tablet 0   No facility-administered medications prior to visit.    Review of Systems  Constitutional:  Negative for chills, diaphoresis, fever, malaise/fatigue and weight loss.  HENT:  Negative for congestion.   Respiratory:  Positive for shortness of breath. Negative for cough, hemoptysis, sputum production and wheezing.   Cardiovascular:  Negative for chest pain, palpitations and leg swelling.     Objective:   Vitals:   08/27/22 1059  BP: 112/70  Pulse: 82  SpO2: 95%  Weight: 105 lb 6.4 oz (47.8 kg)  Height: 4\' 11"  (1.499 m)   SpO2: 95 % O2 Device: None (Room air)  Physical Exam: General: Elderly, frail-appearing, no acute distress HENT: [place], AT Eyes: EOMI, no scleral icterus Respiratory: Clear to auscultation bilaterally.  No crackles, wheezing or rales Cardiovascular: RRR, -M/R/G, no JVD Extremities:-Edema,-tenderness Neuro: AAO x4, CNII-XII grossly intact Psych: Normal mood, normal affect  Data Reviewed:  Imaging: CT chest 09/01/2019-mosaicism, emphysema, prominent airway thickening.  Metallic fiducial marker in left lower lobe.  Nodule in inferior lingula bronchiectasis in the right middle lobe with associated peripheral nodules.  PFT: 07/18/16 FVC 1.24 (65%) FEV1 0.60 (42%) Ratio 49  TLC 117% RV 170% RV/TLC 151% DLCO 80% Interpretation: Severe obstructive defect with significant bronchodilator response. Air trapping  present.  Labs:  ABG    Component Value Date/Time   PHART 7.372 12/21/2021 1415   PCO2ART 77.5 (HH) 12/21/2021 1415   PO2ART 70.0 (L) 12/21/2021 1415   HCO3 50.0 (H) 12/25/2021 0935   O2SAT 59.5 12/25/2021 0935   BIPAP Compliance 07/28/22-08/26/22 Usage days 29/30 days (97%) >4 hours 29 days (97%) IPAP 18 EPAP 6 RR 20    Assessment & Plan:   Discussion: 88 year old female with COPD/emphysema, bronchiectasis, hx of neuroendocrine carcinoma in 2017, hx stage IA left breast cancer s/p lumpectomy in 2017 who presents for follow-up. Symptoms stable on NIV and triple inhaler therapy. Intermittently/rarely uses supplemental O2. Patient wanting to discontinue therapies.  Counseled on benefit of her current regimen. Reasonable to de-escalate inhalers to LAMA/LABA.  COPD/Emphysema, GOLD D --STOP Trelegy --START Stiolto TWO puffs ONCE a day --CONTINUE Albuterol AS NEEDED for shortness of breath or wheezing  Chronic hypoxemic and hypercapneic respiratory failure Patient uses NIV for more than four hours nightly for more then 4 hours per night on 70% of nights during the last three months of usage. --CONTINUE 1L O2 on portable oxygen with activity for SpO2 >88% --CONTINUE NIV nightly  Health Maintenance Immunization History  Administered Date(s) Administered   Fluad Quad(high Dose 65+) 09/06/2021   Influenza Split 09/12/2020   Influenza, High Dose Seasonal PF 09/25/2017, 09/28/2018, 09/20/2019, 08/22/2021   Influenza,inj,Quad PF,6+ Mos 10/15/2016, 09/16/2019   PFIZER(Purple Top)SARS-COV-2 Vaccination 01/01/2020, 01/23/2020, 09/22/2020   Pneumococcal Conjugate-13 05/03/2015   Tdap 07/21/2010   CT Lung Screen- not qualified due to age  No orders of the defined types were placed in this encounter.  Meds ordered this encounter  Medications   Tiotropium Bromide-Olodaterol (STIOLTO RESPIMAT) 2.5-2.5 MCG/ACT AERS    Sig: Inhale 2 puffs into the lungs daily.    Dispense:  4 g    Refill:   2   Tiotropium Bromide-Olodaterol 2.5-2.5 MCG/ACT AERS    Sig: Inhale 2 puffs into the lungs daily.    Dispense:  4 g    Refill:  0    Return in about 2 months (around 10/27/2022).  I have spent a total time of 30-minutes on the day of the appointment including chart review, data review, collecting history, coordinating care and discussing medical diagnosis and plan with the patient/family. Past medical history, allergies, medications were reviewed. Pertinent imaging, labs and tests included in this note have been reviewed and interpreted independently by me.  [place], MD [facility] 08/27/2022 11:08 AM  Office Number [phone]

## 2022-09-18 DIAGNOSIS — Z23 Encounter for immunization: Secondary | ICD-10-CM | POA: Diagnosis not present

## 2022-11-03 ENCOUNTER — Ambulatory Visit (INDEPENDENT_AMBULATORY_CARE_PROVIDER_SITE_OTHER): Payer: Medicare PPO | Admitting: Pulmonary Disease

## 2022-11-03 ENCOUNTER — Encounter (HOSPITAL_BASED_OUTPATIENT_CLINIC_OR_DEPARTMENT_OTHER): Payer: Self-pay | Admitting: Pulmonary Disease

## 2022-11-03 VITALS — BP 120/68 | HR 80 | Ht 59.0 in | Wt 106.2 lb

## 2022-11-03 DIAGNOSIS — J449 Chronic obstructive pulmonary disease, unspecified: Secondary | ICD-10-CM | POA: Diagnosis not present

## 2022-11-03 DIAGNOSIS — J9612 Chronic respiratory failure with hypercapnia: Secondary | ICD-10-CM | POA: Diagnosis not present

## 2022-11-03 DIAGNOSIS — J9611 Chronic respiratory failure with hypoxia: Secondary | ICD-10-CM

## 2022-11-03 MED ORDER — TRELEGY ELLIPTA 200-62.5-25 MCG/ACT IN AEPB: 1.0000 | INHALATION_SPRAY | Freq: Every day | RESPIRATORY_TRACT | 5 refills | Status: DC

## 2022-11-03 NOTE — Patient Instructions (Signed)
COPD/Emphysema, GOLD D --STOP Stiolto --START Trelegy ONE puff ONCE a day --CONTINUE Albuterol AS NEEDED for shortness of breath or wheezing  Chronic hypoxemic and hypercapneic respiratory failure Compliant with therapy. Counseled on benefit of use --CONTINUE NIV nightly --CONTINUE 1L O2 on portable oxygen with activity for SpO2 >88% as needed  Follow-up with me in 5 months

## 2022-11-03 NOTE — Progress Notes (Signed)
Subjective:   PATIENT ID: Victoria Rice GENDER: female DOB: [birth year], MRN: [id number]   HPI  Chief Complaint  Patient presents with   Follow-up    Wants to stop stiolto and go back to trelegy    Reason for Visit: Follow-up  [name] is a 88 year old female with COPD/emphysema, history of neuroendocrine carcinoma in 2017, bronchiectasis, hx stage IA left breast cancer s/p lumpectomy in 2017 who presents for follow-up.  Synopsis: She was previously seen at [facility] with Dr. Carlis Abbott with the last clinic visit on 10/04/19. She was hospitalized in 12/2021 for acute hypoxemic and hypercarbic respiratory failure with PCO2 106, treated with NIV, antibiotics, bronchodilators, steroids and oxygen. She required 2L at discharge. She completed rehab at [facility]. Currently on Trelegy, home oxygen and BiPAP. At baseline she is able to walk and only uses walker as needed for long distances or rough terrain.   2023 -Started on NIV, Trelegy and O2 after Jan hospitalization. Re-established care with [facility]. No further exacerbations  11/03/22 Presents solo today. Reports she is doing fairly. Using stioloto has been difficult and unclear if she is getting the full medication. Prefers trelegy. Has also noticed that she is having more cough that wasn't present before inhaler change. Compliant with her nightly trelegy. Has not worn daytime oxygen with good saturations >90% with ambulation. No exacerbation since Feb 2023.  Prior inhalers: Trelegy - Tolerated with benefit however decreased taste in food  Social History: Former smoker. Quit in 1975.   Past Medical History:  Diagnosis Date   Breast cancer ([place])    lumpectomy, no chemo or XRT; 1 year of anastrazole   Cataract    COPD, severe ([employer] [facility] [place]) 07/31/2016   Family history of adverse reaction to anesthesia    " my sisiter was unable to move for a while with the spinal anesthesia."   Hypothyroidism    nodule     Neuroendocrine carcinoma of lung ([place]) 10/26/2016   Nodule of left lung    lower lobe   Pneumonia    as an infant   Sebaceous cyst of labia 08/18/2018   Shortness of breath dyspnea    recently increased    Skin cancer    nose- tx. with [facility]    Thyroid nodule    Vaginal delivery    x2 /w  one being breech     Family History  Problem Relation Age of Onset   Prostate cancer Brother    Lung cancer Brother    Cystic fibrosis Cousin      Social History   Occupational History   Not on file  Tobacco Use   Smoking status: Former    Packs/day: 0.75    Years: 20.00    Total pack years: 15.00    Types: Cigarettes    Quit date: 05/08/1974    Years since quitting: 48.5   Smokeless tobacco: Never  Vaping Use   Vaping Use: Never used  Substance and Sexual Activity   Alcohol use: Yes    Alcohol/week: 6.0 standard drinks of alcohol    Types: 6 Glasses of wine per week    Comment: social   Drug use: No   Sexual activity: Never    Allergies  Allergen Reactions   Actonel [Risedronate Sodium] Other (See Comments)    indigestion     Outpatient Medications Prior to Visit  Medication Sig Dispense Refill   cholecalciferol (VITAMIN D) 1000 units tablet Take 1,000 Units  by mouth daily.     enalapril (VASOTEC) 2.5 MG tablet Take by mouth.     furosemide (LASIX) 40 MG tablet Take 0.5 tablets (20 mg total) by mouth daily as needed. 30 tablet 2   levothyroxine (SYNTHROID, LEVOTHROID) 50 MCG tablet Take 50 mcg by mouth daily before breakfast.     naproxen sodium (ANAPROX) 220 MG tablet Take 220 mg by mouth 2 (two) times daily as needed (for pain.).     senna-docusate (SENOKOT-S) 8.6-50 MG tablet Take 1 tablet by mouth 2 (two) times daily.     albuterol (VENTOLIN HFA) 108 (90 Base) MCG/ACT inhaler Inhale 1-2 puffs into the lungs every 6 (six) hours as needed for wheezing or shortness of breath.     ALPRAZolam (XANAX) 0.25 MG tablet Take 1 tablet (0.25 mg total) by mouth at bedtime as  needed for anxiety. 30 tablet 0   amLODipine (NORVASC) 2.5 MG tablet Take 2.5 mg by mouth daily.     pantoprazole (PROTONIX) 40 MG tablet Take 1 tablet (40 mg total) by mouth daily.     polyethylene glycol (MIRALAX / GLYCOLAX) 17 g packet Take 17 g by mouth daily as needed for mild constipation. 14 each 0   sodium chloride 1 g tablet Take 1 tablet (1 g total) by mouth 2 (two) times daily with a meal.     traZODone (DESYREL) 50 MG tablet Take 1 tablet (50 mg total) by mouth at bedtime. 30 tablet 0   Tiotropium Bromide-Olodaterol (STIOLTO RESPIMAT) 2.5-2.5 MCG/ACT AERS Inhale 2 puffs into the lungs daily. (Patient not taking: Reported on 11/03/2022) 4 g 2   Tiotropium Bromide-Olodaterol 2.5-2.5 MCG/ACT AERS Inhale 2 puffs into the lungs daily. (Patient not taking: Reported on 11/03/2022) 4 g 0   No facility-administered medications prior to visit.    Review of Systems  Constitutional:  Negative for chills, diaphoresis, fever, malaise/fatigue and weight loss.  HENT:  Negative for congestion.   Respiratory:  Positive for cough and shortness of breath. Negative for hemoptysis, sputum production and wheezing.   Cardiovascular:  Negative for chest pain, palpitations and leg swelling.     Objective:   Vitals:   11/03/22 1100  BP: 120/68  Pulse: 80  SpO2: 95%  Weight: 106 lb 3.2 oz (48.2 kg)  Height: 4\' 11"  (1.499 m)    SpO2: 95 % O2 Device: None (Room air)  Physical Exam: General: Elderly, frail appearing, no acute distress HENT: [place], AT Eyes: EOMI, no scleral icterus Respiratory: Clear to auscultation bilaterally.  No crackles, wheezing or rales Cardiovascular: RRR, -M/R/G, no JVD Extremities:-Edema,-tenderness Neuro: AAO x4, CNII-XII grossly intact Psych: Normal mood, normal affect   Data Reviewed:  Imaging: CT chest 09/01/2019-mosaicism, emphysema, prominent airway thickening.  Metallic fiducial marker in left lower lobe.  Nodule in inferior lingula bronchiectasis in the  right middle lobe with associated peripheral nodules.  PFT: 07/18/16 FVC 1.24 (65%) FEV1 0.60 (42%) Ratio 49  TLC 117% RV 170% RV/TLC 151% DLCO 80% Interpretation: Severe obstructive defect with significant bronchodilator response. Air trapping present.  Labs:  ABG    Component Value Date/Time   PHART 7.372 12/21/2021 1415   PCO2ART 77.5 (HH) 12/21/2021 1415   PO2ART 70.0 (L) 12/21/2021 1415   HCO3 50.0 (H) 12/25/2021 0935   O2SAT 59.5 12/25/2021 0935   BIPAP Compliance 07/28/22-08/26/22 Usage days 29/30 days (97%) >4 hours 29 days (97%) IPAP 18 EPAP 6 RR 20    Assessment & Plan:   Discussion: 88 year old female  with COPD/emphysema, bronchiectasis, hx neuroendocrine carincoma in 2017, hx stage IA left breast cancer s/p lumpectomy in 2017 who presents for follow-up. Breakthrough symptoms on Stiolto. Prefers Trelegy. Discussed clinical course and management of COPD including nocturnal ventilation, bronchodilator regimen and action plan for exacerbation. Has not needed daytime oxygen for several months  COPD/Emphysema, GOLD D --STOP Stiolto --START Trelegy ONE puff ONCE a day --CONTINUE Albuterol AS NEEDED for shortness of breath or wheezing  Chronic hypoxemic and hypercapneic respiratory failure Compliant with therapy. Counseled on benefit of use --CONTINUE NIV nightly --CONTINUE 1L O2 on portable oxygen with activity for SpO2 >88% as needed  Health Maintenance Immunization History  Administered Date(s) Administered   Fluad Quad(high Dose 65+) 09/06/2021   Influenza Split 09/12/2020   Influenza, High Dose Seasonal PF 09/25/2017, 09/28/2018, 09/20/2019, 08/22/2021   Influenza,inj,Quad PF,6+ Mos 10/15/2016, 09/16/2019   PFIZER(Purple Top)SARS-COV-2 Vaccination 01/01/2020, 01/23/2020, 09/22/2020   Pneumococcal Conjugate-13 05/03/2015   Tdap 07/21/2010   CT Lung Screen- not qualified due to age  No orders of the defined types were placed in this encounter.  Meds ordered  this encounter  Medications   Fluticasone-Umeclidin-Vilant (TRELEGY ELLIPTA) 200-62.5-25 MCG/ACT AEPB    Sig: Inhale 1 puff into the lungs daily.    Dispense:  60 each    Refill:  5    Return in about 5 months (around 04/04/2023).  I have spent a total time of 35-minutes on the day of the appointment including chart review, data review, collecting history, coordinating care and discussing medical diagnosis and plan with the patient/family. Past medical history, allergies, medications were reviewed. Pertinent imaging, labs and tests included in this note have been reviewed and interpreted independently by me.  Patrik Turnbaugh Rodman Pickle, MD [facility] 11/03/2022 12:37 PM  Office Number [phone]

## 2022-11-06 ENCOUNTER — Telehealth: Payer: Self-pay | Admitting: Pulmonary Disease

## 2022-11-06 NOTE — Telephone Encounter (Signed)
Fax received from pt's pharmacy for a refill request of pt's furosemide 40mg  tablet #90 tabs with instructions to take 1/2 by mouth daily as needed.  Dr. Loanne Drilling, please advise if you are okay with [place] refilling med for pt.

## 2022-11-07 MED ORDER — FUROSEMIDE 40 MG PO TABS: 20.0000 mg | ORAL_TABLET | Freq: Every day | ORAL | 2 refills | Status: DC | PRN

## 2022-11-07 NOTE — Telephone Encounter (Signed)
Furosemide refilled. 

## 2022-12-24 DIAGNOSIS — D3A090 Benign carcinoid tumor of the bronchus and lung: Secondary | ICD-10-CM | POA: Diagnosis not present

## 2022-12-24 DIAGNOSIS — E039 Hypothyroidism, unspecified: Secondary | ICD-10-CM | POA: Diagnosis not present

## 2022-12-24 DIAGNOSIS — Z79899 Other long term (current) drug therapy: Secondary | ICD-10-CM | POA: Diagnosis not present

## 2022-12-24 DIAGNOSIS — E559 Vitamin D deficiency, unspecified: Secondary | ICD-10-CM | POA: Diagnosis not present

## 2022-12-24 DIAGNOSIS — Z853 Personal history of malignant neoplasm of breast: Secondary | ICD-10-CM | POA: Diagnosis not present

## 2022-12-24 DIAGNOSIS — M810 Age-related osteoporosis without current pathological fracture: Secondary | ICD-10-CM | POA: Diagnosis not present

## 2022-12-24 DIAGNOSIS — E7800 Pure hypercholesterolemia, unspecified: Secondary | ICD-10-CM | POA: Diagnosis not present

## 2022-12-24 DIAGNOSIS — I10 Essential (primary) hypertension: Secondary | ICD-10-CM | POA: Diagnosis not present

## 2022-12-24 DIAGNOSIS — J449 Chronic obstructive pulmonary disease, unspecified: Secondary | ICD-10-CM | POA: Diagnosis not present

## 2023-01-03 DIAGNOSIS — J449 Chronic obstructive pulmonary disease, unspecified: Secondary | ICD-10-CM | POA: Diagnosis not present

## 2023-02-03 DIAGNOSIS — J449 Chronic obstructive pulmonary disease, unspecified: Secondary | ICD-10-CM | POA: Diagnosis not present

## 2023-04-03 ENCOUNTER — Ambulatory Visit (HOSPITAL_BASED_OUTPATIENT_CLINIC_OR_DEPARTMENT_OTHER): Payer: Medicare PPO | Admitting: Pulmonary Disease

## 2023-04-03 ENCOUNTER — Encounter (HOSPITAL_BASED_OUTPATIENT_CLINIC_OR_DEPARTMENT_OTHER): Payer: Self-pay | Admitting: Pulmonary Disease

## 2023-04-03 VITALS — BP 110/62 | HR 47 | Temp 97.9°F | Ht 59.0 in | Wt 109.6 lb

## 2023-04-03 DIAGNOSIS — J9611 Chronic respiratory failure with hypoxia: Secondary | ICD-10-CM | POA: Diagnosis not present

## 2023-04-03 DIAGNOSIS — J449 Chronic obstructive pulmonary disease, unspecified: Secondary | ICD-10-CM

## 2023-04-03 DIAGNOSIS — J9612 Chronic respiratory failure with hypercapnia: Secondary | ICD-10-CM

## 2023-04-03 MED ORDER — TRELEGY ELLIPTA 200-62.5-25 MCG/ACT IN AEPB: 1.0000 | INHALATION_SPRAY | Freq: Every day | RESPIRATORY_TRACT | 5 refills | Status: DC

## 2023-04-03 NOTE — Patient Instructions (Signed)
COPD/Emphysema, GOLD D --CONTINUE Trelegy ONE puff ONCE a day --CONTINUE Albuterol AS NEEDED for shortness of breath or wheezing   Chronic hypoxemic and hypercapneic respiratory failure Compliant with therapy. Counseled on benefit of use --CONTINUE NIV nightly --Ambulatory O2 with no desaturations. Nadir SpO2 90%

## 2023-04-03 NOTE — Progress Notes (Unsigned)
Subjective:   PATIENT ID: Victoria Rice GENDER: female DOB: 1931-10-17, MRN: [id number]   HPI  Chief Complaint  Patient presents with   Follow-up    Follow up.     Reason for Visit: Follow-up  Ms. Nautika Cressey is a 89 year old female with COPD/emphysema, history of neuroendocrine carcinoma in 2017, bronchiectasis, hx stage IA left breast cancer s/p lumpectomy in 2017 who presents for follow-up.  Synopsis: She was previously seen at [facility] with Dr. Chestine Spore with the last clinic visit on 10/04/19. She was hospitalized in 12/2021 for acute hypoxemic and hypercarbic respiratory failure with PCO2 106, treated with NIV, antibiotics, bronchodilators, steroids and oxygen. She required 2L at discharge. She completed rehab at [facility]. Currently on Trelegy, home oxygen and BiPAP. At baseline she is able to walk and only uses walker as needed for long distances or rough terrain.   2023 -Started on NIV, Trelegy and O2 after Jan hospitalization. Re-established care with [facility]. No further exacerbations  11/03/22 Presents solo today. Reports she is doing fairly. Using stioloto has been difficult and unclear if she is getting the full medication. Prefers trelegy. Has also noticed that she is having more cough that wasn't present before inhaler change. Compliant with her nightly trelegy. Has not worn daytime oxygen with good saturations >90% with ambulation. No exacerbation since Feb 2023.  04/03/23 Since our last visit she reports her breathing is overall doing well. She has daily coughing fit that she she believes is triggered from sinus drainage. Compliant with Trelegy. Her son recently passed away from an aggressive cancer.   Prior inhalers: Trelegy - Tolerated with benefit however decreased taste in food  Social History: Former smoker. Quit in 1975.   Past Medical History:  Diagnosis Date   Breast cancer (HCC)    lumpectomy, no chemo or XRT; 1 year of anastrazole    Cataract    COPD, severe (HCC) 07/31/2016   Family history of adverse reaction to anesthesia    " my sisiter was unable to move for a while with the spinal anesthesia."   Hypothyroidism    nodule    Neuroendocrine carcinoma of lung (HCC) 10/26/2016   Nodule of left lung    lower lobe   Pneumonia    as an infant   Sebaceous cyst of labia 08/18/2018   Shortness of breath dyspnea    recently increased    Skin cancer    nose- tx. with [facility]    Thyroid nodule    Vaginal delivery    x2 /w  one being breech     Family History  Problem Relation Age of Onset   Prostate cancer Brother    Lung cancer Brother    Cystic fibrosis Cousin      Social History   Occupational History   Not on file  Tobacco Use   Smoking status: Former    Packs/day: 0.75    Years: 20.00    Additional pack years: 0.00    Total pack years: 15.00    Types: Cigarettes    Quit date: 05/08/1974    Years since quitting: 48.9   Smokeless tobacco: Never  Vaping Use   Vaping Use: Never used  Substance and Sexual Activity   Alcohol use: Yes    Alcohol/week: 6.0 standard drinks of alcohol    Types: 6 Glasses of wine per week    Comment: social   Drug use: No   Sexual activity: Never  Allergies  Allergen Reactions   Actonel [Risedronate Sodium] Other (See Comments)    indigestion     Outpatient Medications Prior to Visit  Medication Sig Dispense Refill   albuterol (VENTOLIN HFA) 108 (90 Base) MCG/ACT inhaler Inhale 1-2 puffs into the lungs every 6 (six) hours as needed for wheezing or shortness of breath.     cholecalciferol (VITAMIN D) 1000 units tablet Take 1,000 Units by mouth daily.     enalapril (VASOTEC) 2.5 MG tablet Take by mouth.     Fluticasone-Umeclidin-Vilant (TRELEGY ELLIPTA) 200-62.5-25 MCG/ACT AEPB Inhale 1 puff into the lungs daily. 60 each 5   levothyroxine (SYNTHROID, LEVOTHROID) 50 MCG tablet Take 50 mcg by mouth daily before breakfast.     naproxen sodium (ANAPROX) 220 MG  tablet Take 220 mg by mouth 2 (two) times daily as needed (for pain.).     senna-docusate (SENOKOT-S) 8.6-50 MG tablet Take 1 tablet by mouth 2 (two) times daily.     ALPRAZolam (XANAX) 0.25 MG tablet Take 1 tablet (0.25 mg total) by mouth at bedtime as needed for anxiety. (Patient not taking: Reported on 04/03/2023) 30 tablet 0   amLODipine (NORVASC) 2.5 MG tablet Take 2.5 mg by mouth daily. (Patient not taking: Reported on 04/03/2023)     furosemide (LASIX) 40 MG tablet Take 0.5 tablets (20 mg total) by mouth daily as needed. (Patient not taking: Reported on 04/03/2023) 30 tablet 2   pantoprazole (PROTONIX) 40 MG tablet Take 1 tablet (40 mg total) by mouth daily. (Patient not taking: Reported on 04/03/2023)     polyethylene glycol (MIRALAX / GLYCOLAX) 17 g packet Take 17 g by mouth daily as needed for mild constipation. (Patient not taking: Reported on 04/03/2023) 14 each 0   sodium chloride 1 g tablet Take 1 tablet (1 g total) by mouth 2 (two) times daily with a meal. (Patient not taking: Reported on 04/03/2023)     traZODone (DESYREL) 50 MG tablet Take 1 tablet (50 mg total) by mouth at bedtime. (Patient not taking: Reported on 04/03/2023) 30 tablet 0   No facility-administered medications prior to visit.    Review of Systems  Constitutional:  Negative for chills, diaphoresis, fever, malaise/fatigue and weight loss.  HENT:  Positive for congestion.   Respiratory:  Positive for cough. Negative for hemoptysis, sputum production, shortness of breath and wheezing.   Cardiovascular:  Negative for chest pain, palpitations and leg swelling.     Objective:   Vitals:   04/03/23 1136  BP: 110/62  Pulse: (!) 47  Temp: 97.9 F (36.6 C)  TempSrc: Oral  SpO2: 90%  Weight: 109 lb 9.6 oz (49.7 kg)  Height: 4\' 11"  (1.499 m)    SpO2: 90 % O2 Device: None (Room air)  Physical Exam: General: Well-appearing, no acute distress HENT: [place], AT Eyes: EOMI, no scleral icterus Respiratory: Clear to  auscultation bilaterally.  No crackles, wheezing or rales Cardiovascular: RRR, -M/R/G, no JVD Extremities:-Edema,-tenderness Neuro: AAO x4, CNII-XII grossly intact Psych: Normal mood, normal affect   Data Reviewed:  Imaging: CT chest 09/01/2019-mosaicism, emphysema, prominent airway thickening.  Metallic fiducial marker in left lower lobe.  Nodule in inferior lingula bronchiectasis in the right middle lobe with associated peripheral nodules.  PFT: 07/18/16 FVC 1.24 (65%) FEV1 0.60 (42%) Ratio 49  TLC 117% RV 170% RV/TLC 151% DLCO 80% Interpretation: Severe obstructive defect with significant bronchodilator response. Air trapping present.  Labs:  ABG    Component Value Date/Time   PHART 7.372 12/21/2021 1415  PCO2ART 77.5 (HH) 12/21/2021 1415   PO2ART 70.0 (L) 12/21/2021 1415   HCO3 50.0 (H) 12/25/2021 0935   O2SAT 59.5 12/25/2021 0935   BIPAP Compliance 07/28/22-08/26/22 Usage days 29/30 days (97%) >4 hours 29 days (97%) IPAP 18 EPAP 6 RR 20    Assessment & Plan:   Discussion: 89 year old female with COPD/emphysema, bronchiectasis, hx neuroendocrine carcinoma in 2017, hx stage IA left breast cancer s/p lumpectomy in 2017 who presents for follow-up. Last visit was stepped up from Stiolto to Trelegy and less symptoms. Compliant with NIV. Ambulatory O2 with no desaturations. Discussed clinical course and management of COPD/asthma including bronchodilator regimen and action plan for exacerbation.  COPD/Emphysema, GOLD D --CONTINUE Trelegy ONE puff ONCE a day --CONTINUE Albuterol AS NEEDED for shortness of breath or wheezing  Chronic hypoxemic and hypercapneic respiratory failure Compliant with therapy. Counseled on benefit of use --CONTINUE NIV nightly --Ambulatory O2 with no desaturations. Nadir SpO2 90%  Health Maintenance Immunization History  Administered Date(s) Administered   Fluad Quad(high Dose 65+) 09/06/2021   Influenza Split 09/12/2020   Influenza, High Dose  Seasonal PF 09/25/2017, 09/28/2018, 09/20/2019, 08/22/2021   Influenza,inj,Quad PF,6+ Mos 10/15/2016, 09/16/2019   PFIZER(Purple Top)SARS-COV-2 Vaccination 01/01/2020, 01/23/2020, 09/22/2020   Pneumococcal Conjugate-13 05/03/2015   Tdap 07/21/2010   CT Lung Screen- not qualified due to age  No orders of the defined types were placed in this encounter.  Meds ordered this encounter  Medications   Fluticasone-Umeclidin-Vilant (TRELEGY ELLIPTA) 200-62.5-25 MCG/ACT AEPB    Sig: Inhale 1 puff into the lungs daily.    Dispense:  60 each    Refill:  5    Return in about 6 months (around 10/03/2023).  I have spent a total time of 36-minutes on the day of the appointment including chart review, data review, collecting history, coordinating care and discussing medical diagnosis and plan with the patient/family. Past medical history, allergies, medications were reviewed. Pertinent imaging, labs and tests included in this note have been reviewed and interpreted independently by me.  Terre Hanneman Mechele Collin, MD [facility] 04/03/2023 11:56 AM  Office Number [phone]

## 2023-04-09 ENCOUNTER — Encounter (HOSPITAL_BASED_OUTPATIENT_CLINIC_OR_DEPARTMENT_OTHER): Payer: Self-pay | Admitting: Pulmonary Disease

## 2023-06-01 ENCOUNTER — Telehealth: Payer: Self-pay | Admitting: Pulmonary Disease

## 2023-06-01 NOTE — Telephone Encounter (Signed)
Pt called in bc she rcvd a statement from [facility] and the date she visited with [place] on 04/03/23 is listed under [facility]. Transferred pt to billing.

## 2023-07-14 ENCOUNTER — Other Ambulatory Visit: Payer: Self-pay | Admitting: Pain Medicine

## 2023-07-14 DIAGNOSIS — I700 Atherosclerosis of aorta: Secondary | ICD-10-CM | POA: Diagnosis not present

## 2023-07-14 DIAGNOSIS — I10 Essential (primary) hypertension: Secondary | ICD-10-CM | POA: Diagnosis not present

## 2023-07-14 DIAGNOSIS — J9611 Chronic respiratory failure with hypoxia: Secondary | ICD-10-CM | POA: Diagnosis not present

## 2023-07-14 DIAGNOSIS — Z1239 Encounter for other screening for malignant neoplasm of breast: Secondary | ICD-10-CM | POA: Diagnosis not present

## 2023-07-14 DIAGNOSIS — Z0000 Encounter for general adult medical examination without abnormal findings: Secondary | ICD-10-CM | POA: Diagnosis not present

## 2023-07-14 DIAGNOSIS — Z6823 Body mass index (BMI) 23.0-23.9, adult: Secondary | ICD-10-CM | POA: Diagnosis not present

## 2023-07-14 DIAGNOSIS — Z1231 Encounter for screening mammogram for malignant neoplasm of breast: Secondary | ICD-10-CM

## 2023-08-26 DIAGNOSIS — H43391 Other vitreous opacities, right eye: Secondary | ICD-10-CM | POA: Diagnosis not present

## 2023-08-26 DIAGNOSIS — H5703 Miosis: Secondary | ICD-10-CM | POA: Diagnosis not present

## 2023-08-26 DIAGNOSIS — Z961 Presence of intraocular lens: Secondary | ICD-10-CM | POA: Diagnosis not present

## 2023-08-26 DIAGNOSIS — H04123 Dry eye syndrome of bilateral lacrimal glands: Secondary | ICD-10-CM | POA: Diagnosis not present

## 2023-08-26 DIAGNOSIS — H2512 Age-related nuclear cataract, left eye: Secondary | ICD-10-CM | POA: Diagnosis not present

## 2023-10-01 DIAGNOSIS — H2512 Age-related nuclear cataract, left eye: Secondary | ICD-10-CM | POA: Diagnosis not present

## 2023-10-01 DIAGNOSIS — H5703 Miosis: Secondary | ICD-10-CM | POA: Diagnosis not present

## 2023-10-01 DIAGNOSIS — H2589 Other age-related cataract: Secondary | ICD-10-CM | POA: Diagnosis not present

## 2023-10-26 DIAGNOSIS — Z23 Encounter for immunization: Secondary | ICD-10-CM | POA: Diagnosis not present

## 2023-11-30 ENCOUNTER — Encounter (HOSPITAL_BASED_OUTPATIENT_CLINIC_OR_DEPARTMENT_OTHER): Payer: Self-pay | Admitting: Pulmonary Disease

## 2023-11-30 ENCOUNTER — Ambulatory Visit (HOSPITAL_BASED_OUTPATIENT_CLINIC_OR_DEPARTMENT_OTHER): Payer: Medicare PPO | Admitting: Pulmonary Disease

## 2023-11-30 ENCOUNTER — Other Ambulatory Visit (HOSPITAL_COMMUNITY): Payer: Self-pay

## 2023-11-30 VITALS — BP 148/72 | HR 83 | Resp 16 | Ht 59.0 in | Wt 115.6 lb

## 2023-11-30 DIAGNOSIS — J9611 Chronic respiratory failure with hypoxia: Secondary | ICD-10-CM

## 2023-11-30 DIAGNOSIS — J449 Chronic obstructive pulmonary disease, unspecified: Secondary | ICD-10-CM

## 2023-11-30 DIAGNOSIS — J9612 Chronic respiratory failure with hypercapnia: Secondary | ICD-10-CM | POA: Diagnosis not present

## 2023-11-30 MED ORDER — FLUTICASONE FUROATE-VILANTEROL 100-25 MCG/ACT IN AEPB: 1.0000 | INHALATION_SPRAY | Freq: Every day | RESPIRATORY_TRACT | 1 refills | Status: DC

## 2023-11-30 NOTE — Progress Notes (Signed)
Subjective:   PATIENT ID: Victoria Rice GENDER: female DOB: 06-Nov-1931, MRN: [id number]   HPI  Chief Complaint  Patient presents with   Follow-up    COPD. Breathing has not been bad, not perfect. Seems more like problems breathing is due to sinus and bipap machine.     Reason for Visit: Follow-up  [name] is a 89 year old female with COPD/emphysema, history of neuroendocrine carcinoma in 2017, bronchiectasis, hx stage IA left breast cancer s/p lumpectomy in 2017 who presents for follow-up.  Synopsis: She was previously seen at [facility] with Dr. Chestine Spore with the last clinic visit on 10/04/19. She was hospitalized in 12/2021 for acute hypoxemic and hypercarbic respiratory failure with PCO2 106, treated with NIV, antibiotics, bronchodilators, steroids and oxygen. She required 2L at discharge. She completed rehab at [facility]. Currently on Trelegy, home oxygen and BiPAP. At baseline she is able to walk and only uses walker as needed for long distances or rough terrain.   2023 -Started on NIV, Trelegy and O2 after Jan hospitalization. Re-established care with [facility]. No further exacerbations  11/03/22 Presents solo today. Reports she is doing fairly. Using stioloto has been difficult and unclear if she is getting the full medication. Prefers trelegy. Has also noticed that she is having more cough that wasn't present before inhaler change. Compliant with her nightly trelegy. Has not worn daytime oxygen with good saturations >90% with ambulation. No exacerbation since Feb 2023.  04/03/23 Since our last visit she reports her breathing is overall doing well. She has daily coughing fit that she she believes is triggered from sinus drainage. Compliant with Trelegy. Her son recently passed away from an aggressive cancer.   11/30/23 Since our last visit she reports overall doing well. Reports her oxygen 96-98%. She uses her BiPAP nightly but complains of extremely dry  mouth and sometimes has a dry cough. Has been using humidifier with distilled water. Compliant with Trelegy 200 but concerned this is affecting her with dry mouth.   Prior inhalers: Trelegy - Tolerated with benefit however decreased taste in food  Social History: Former smoker. Quit in 1975.   Past Medical History:  Diagnosis Date   Breast cancer (HCC)    lumpectomy, no chemo or XRT; 1 year of anastrazole   Cataract    COPD, severe (HCC) 07/31/2016   Family history of adverse reaction to anesthesia    " my sisiter was unable to move for a while with the spinal anesthesia."   Hypothyroidism    nodule    Neuroendocrine carcinoma of lung (HCC) 10/26/2016   Nodule of left lung    lower lobe   Pneumonia    as an infant   Sebaceous cyst of labia 08/18/2018   Shortness of breath dyspnea    recently increased    Skin cancer    nose- tx. with [facility]    Thyroid nodule    Vaginal delivery    x2 /w  one being breech     Family History  Problem Relation Age of Onset   Prostate cancer Brother    Lung cancer Brother    Cystic fibrosis Cousin    Cancer Son      Social History   Occupational History   Not on file  Tobacco Use   Smoking status: Former    Current packs/day: 0.00    Average packs/day: 0.8 packs/day for 20.0 years (15.0 ttl pk-yrs)    Types: Cigarettes  Start date: 05/08/1954    Quit date: 05/08/1974    Years since quitting: 49.5   Smokeless tobacco: Never  Vaping Use   Vaping status: Never Used  Substance and Sexual Activity   Alcohol use: Yes    Alcohol/week: 6.0 standard drinks of alcohol    Types: 6 Glasses of wine per week    Comment: social   Drug use: No   Sexual activity: Never    Allergies  Allergen Reactions   Actonel [Risedronate Sodium] Other (See Comments)    indigestion   Amlodipine Swelling     Outpatient Medications Prior to Visit  Medication Sig Dispense Refill   cholecalciferol (VITAMIN D) 1000 units tablet Take 1,000 Units by  mouth daily.     enalapril (VASOTEC) 2.5 MG tablet Take by mouth.     Fluticasone-Umeclidin-Vilant (TRELEGY ELLIPTA) 200-62.5-25 MCG/ACT AEPB Inhale 1 puff into the lungs daily. 60 each 5   levothyroxine (SYNTHROID, LEVOTHROID) 50 MCG tablet Take 50 mcg by mouth daily before breakfast.     naproxen sodium (ANAPROX) 220 MG tablet Take 220 mg by mouth 2 (two) times daily as needed (for pain.).     senna-docusate (SENOKOT-S) 8.6-50 MG tablet Take 1 tablet by mouth 2 (two) times daily.     albuterol (VENTOLIN HFA) 108 (90 Base) MCG/ACT inhaler Inhale 1-2 puffs into the lungs every 6 (six) hours as needed for wheezing or shortness of breath.     ALPRAZolam (XANAX) 0.25 MG tablet Take 1 tablet (0.25 mg total) by mouth at bedtime as needed for anxiety. (Patient not taking: Reported on 04/03/2023) 30 tablet 0   amLODipine (NORVASC) 2.5 MG tablet Take 2.5 mg by mouth daily. (Patient not taking: Reported on 04/03/2023)     furosemide (LASIX) 40 MG tablet Take 0.5 tablets (20 mg total) by mouth daily as needed. (Patient not taking: Reported on 04/03/2023) 30 tablet 2   pantoprazole (PROTONIX) 40 MG tablet Take 1 tablet (40 mg total) by mouth daily. (Patient not taking: Reported on 04/03/2023)     polyethylene glycol (MIRALAX / GLYCOLAX) 17 g packet Take 17 g by mouth daily as needed for mild constipation. (Patient not taking: Reported on 04/03/2023) 14 each 0   sodium chloride 1 g tablet Take 1 tablet (1 g total) by mouth 2 (two) times daily with a meal. (Patient not taking: Reported on 04/03/2023)     traZODone (DESYREL) 50 MG tablet Take 1 tablet (50 mg total) by mouth at bedtime. (Patient not taking: Reported on 04/03/2023) 30 tablet 0   No facility-administered medications prior to visit.    Review of Systems  Constitutional:  Negative for chills, diaphoresis, fever, malaise/fatigue and weight loss.  HENT:  Negative for congestion.   Respiratory:  Negative for cough, hemoptysis, sputum production, shortness  of breath and wheezing.   Cardiovascular:  Negative for chest pain, palpitations and leg swelling.     Objective:   Vitals:   11/30/23 1102  BP: (!) 148/72  Pulse: 83  Resp: 16  SpO2: 96%  Weight: 115 lb 9.6 oz (52.4 kg)  Height: 4\' 11"  (1.499 m)    SpO2: 96 %  Physical Exam: General: Well-appearing, no acute distress HENT: [place], AT Eyes: EOMI, no scleral icterus Respiratory: Clear to auscultation bilaterally.  No crackles, wheezing or rales Cardiovascular: RRR, -M/R/G, no JVD Extremities:-Edema,-tenderness Neuro: AAO x4, CNII-XII grossly intact Psych: Normal mood, normal affect  Data Reviewed:  Imaging: CT chest 09/01/2019-mosaicism, emphysema, prominent airway thickening.  Metallic fiducial marker  in left lower lobe.  Nodule in inferior lingula bronchiectasis in the right middle lobe with associated peripheral nodules.  PFT: 07/18/16 FVC 1.24 (65%) FEV1 0.60 (42%) Ratio 49  TLC 117% RV 170% RV/TLC 151% DLCO 80% Interpretation: Severe obstructive defect with significant bronchodilator response. Air trapping present.  Labs:  ABG    Component Value Date/Time   PHART 7.372 12/21/2021 1415   PCO2ART 77.5 (HH) 12/21/2021 1415   PO2ART 70.0 (L) 12/21/2021 1415   HCO3 50.0 (H) 12/25/2021 0935   O2SAT 59.5 12/25/2021 0935   BIPAP Compliance 07/28/22-08/26/22 Usage days 29/30 days (97%) >4 hours 29 days (97%) IPAP 18 EPAP 6 RR 20    Assessment & Plan:   Discussion: 89 year old female with COPD/emphysema, bronchiectasis, hx neuroendocrine carcinoma in 2017, hx stage IA left breast cancer s/p lumpectomy in 2017 who presents for follow-up. Overall doing well and compliant with NIV. Does have dry mouth and worried about inhalers. Will trial Breo with reduced ICS and remove LAMA portion.  Discussed clinical course and management of COPD including bronchodilator regimen, preventive care including vaccinations and action plan for exacerbation.  COPD/Emphysema, GOLD D --STOP  Trelegy 200 --START Breo 100-50 mcg ONE puff ONCE a day. Monitor symptoms and see if this improves dry mouth and eyes  >If breathing gets worse, then restart Trelegy --CONTINUE Albuterol AS NEEDED for shortness of breath or wheezing  Chronic hypoxemic and hypercapneic respiratory failure Patient uses NIV for more than four hours nightly for at least 70% of nights during the last three months of usage. The patient has been using and benefiting from PAP use and will continue to benefit from therapy.  --CONTINUE NIV nightly  Health Maintenance Immunization History  Administered Date(s) Administered   Fluad Quad(high Dose 65+) 09/06/2021   Influenza Split 09/12/2020   Influenza, High Dose Seasonal PF 09/25/2017, 09/28/2018, 09/20/2019, 08/22/2021   Influenza,inj,Quad PF,6+ Mos 10/15/2016, 09/16/2019   Influenza-Unspecified 10/12/2023   PFIZER(Purple Top)SARS-COV-2 Vaccination 01/01/2020, 01/23/2020, 09/22/2020   Pneumococcal Conjugate-13 05/03/2015   Tdap 07/21/2010   CT Lung Screen- not qualified due to age  No orders of the defined types were placed in this encounter.  Meds ordered this encounter  Medications   fluticasone furoate-vilanterol (BREO ELLIPTA) 100-25 MCG/ACT AEPB    Sig: Inhale 1 puff into the lungs daily.    Dispense:  30 each    Refill:  1    Return in about 3 months (around 02/28/2024).  I have spent a total time of 30-minutes on the day of the appointment including chart review, data review, collecting history, coordinating care and discussing medical diagnosis and plan with the patient/family. Past medical history, allergies, medications were reviewed. Pertinent imaging, labs and tests included in this note have been reviewed and interpreted independently by me.  Sani Madariaga Mechele Collin, MD [facility] 11/30/2023 11:32 AM  Office Number [phone]

## 2023-11-30 NOTE — Patient Instructions (Addendum)
COPD/Emphysema, GOLD D --STOP Trelegy 200 --START Breo 100-50 mcg ONE puff ONCE a day. Monitor symptoms and see if this improves dry mouth and eyes  >If breathing gets worse, then restart Trelegy --CONTINUE Albuterol AS NEEDED for shortness of breath or wheezing  Chronic hypoxemic and hypercapneic respiratory failure Patient uses NIV for more than four hours nightly for at least 70% of nights during the last three months of usage. The patient has been using and benefiting from PAP use and will continue to benefit from therapy.  --CONTINUE NIV nightly

## 2024-01-13 DIAGNOSIS — Z6823 Body mass index (BMI) 23.0-23.9, adult: Secondary | ICD-10-CM | POA: Diagnosis not present

## 2024-01-13 DIAGNOSIS — J432 Centrilobular emphysema: Secondary | ICD-10-CM | POA: Diagnosis not present

## 2024-01-13 DIAGNOSIS — J101 Influenza due to other identified influenza virus with other respiratory manifestations: Secondary | ICD-10-CM | POA: Diagnosis not present

## 2024-01-13 DIAGNOSIS — Z03818 Encounter for observation for suspected exposure to other biological agents ruled out: Secondary | ICD-10-CM | POA: Diagnosis not present

## 2024-01-13 DIAGNOSIS — R051 Acute cough: Secondary | ICD-10-CM | POA: Diagnosis not present

## 2024-01-13 DIAGNOSIS — Z79899 Other long term (current) drug therapy: Secondary | ICD-10-CM | POA: Diagnosis not present

## 2024-01-13 DIAGNOSIS — J449 Chronic obstructive pulmonary disease, unspecified: Secondary | ICD-10-CM | POA: Diagnosis not present

## 2024-01-15 DIAGNOSIS — S59902A Unspecified injury of left elbow, initial encounter: Secondary | ICD-10-CM | POA: Diagnosis not present

## 2024-01-15 DIAGNOSIS — M25522 Pain in left elbow: Secondary | ICD-10-CM | POA: Diagnosis not present

## 2024-01-15 DIAGNOSIS — S42402A Unspecified fracture of lower end of left humerus, initial encounter for closed fracture: Secondary | ICD-10-CM | POA: Diagnosis not present

## 2024-01-15 DIAGNOSIS — S59909A Unspecified injury of unspecified elbow, initial encounter: Secondary | ICD-10-CM | POA: Diagnosis not present

## 2024-01-15 DIAGNOSIS — S52032A Displaced fracture of olecranon process with intraarticular extension of left ulna, initial encounter for closed fracture: Secondary | ICD-10-CM | POA: Diagnosis not present

## 2024-01-15 DIAGNOSIS — R059 Cough, unspecified: Secondary | ICD-10-CM | POA: Diagnosis not present

## 2024-01-21 DIAGNOSIS — S52032D Displaced fracture of olecranon process with intraarticular extension of left ulna, subsequent encounter for closed fracture with routine healing: Secondary | ICD-10-CM | POA: Diagnosis not present

## 2024-01-21 DIAGNOSIS — S52032A Displaced fracture of olecranon process with intraarticular extension of left ulna, initial encounter for closed fracture: Secondary | ICD-10-CM | POA: Diagnosis not present

## 2024-01-21 DIAGNOSIS — S42295D Other nondisplaced fracture of upper end of left humerus, subsequent encounter for fracture with routine healing: Secondary | ICD-10-CM | POA: Diagnosis not present

## 2024-01-21 DIAGNOSIS — M810 Age-related osteoporosis without current pathological fracture: Secondary | ICD-10-CM | POA: Diagnosis not present

## 2024-01-21 DIAGNOSIS — J441 Chronic obstructive pulmonary disease with (acute) exacerbation: Secondary | ICD-10-CM | POA: Diagnosis not present

## 2024-01-21 DIAGNOSIS — Z6823 Body mass index (BMI) 23.0-23.9, adult: Secondary | ICD-10-CM | POA: Diagnosis not present

## 2024-01-28 DIAGNOSIS — S52032A Displaced fracture of olecranon process with intraarticular extension of left ulna, initial encounter for closed fracture: Secondary | ICD-10-CM | POA: Diagnosis not present

## 2024-02-10 ENCOUNTER — Other Ambulatory Visit (HOSPITAL_BASED_OUTPATIENT_CLINIC_OR_DEPARTMENT_OTHER): Payer: Self-pay | Admitting: Pulmonary Disease

## 2024-02-12 DIAGNOSIS — S52032D Displaced fracture of olecranon process with intraarticular extension of left ulna, subsequent encounter for closed fracture with routine healing: Secondary | ICD-10-CM | POA: Diagnosis not present

## 2024-02-12 NOTE — Telephone Encounter (Signed)
 Patient states called pharmacy this morning. Patient states they have not heard back from [place] and needs this before the weekend and is running out if possible please.   Please advise and let patient know it has been filled. Thanks!

## 2024-02-18 ENCOUNTER — Telehealth (HOSPITAL_BASED_OUTPATIENT_CLINIC_OR_DEPARTMENT_OTHER): Payer: Self-pay | Admitting: Pulmonary Disease

## 2024-02-18 ENCOUNTER — Ambulatory Visit (HOSPITAL_BASED_OUTPATIENT_CLINIC_OR_DEPARTMENT_OTHER): Payer: Medicare PPO | Admitting: Pulmonary Disease

## 2024-02-18 NOTE — Telephone Encounter (Signed)
 Noted.

## 2024-02-18 NOTE — Telephone Encounter (Signed)
 Patient calling in regards to picking up her script. Some how generic of Virgel Bouquet was called in and had a higher copay when patient picked up her script. generic was 64 while breo is 40 humana spoted cost this time but will not pay for again. When renewing patients script please call in Breo instead of generic is what the patient is stating.   Please advise  [facility]

## 2024-02-23 DIAGNOSIS — Z79899 Other long term (current) drug therapy: Secondary | ICD-10-CM | POA: Diagnosis not present

## 2024-02-23 DIAGNOSIS — J449 Chronic obstructive pulmonary disease, unspecified: Secondary | ICD-10-CM | POA: Diagnosis not present

## 2024-02-25 DIAGNOSIS — Z6822 Body mass index (BMI) 22.0-22.9, adult: Secondary | ICD-10-CM | POA: Diagnosis not present

## 2024-02-25 DIAGNOSIS — E559 Vitamin D deficiency, unspecified: Secondary | ICD-10-CM | POA: Diagnosis not present

## 2024-02-25 DIAGNOSIS — I10 Essential (primary) hypertension: Secondary | ICD-10-CM | POA: Diagnosis not present

## 2024-02-25 DIAGNOSIS — F419 Anxiety disorder, unspecified: Secondary | ICD-10-CM | POA: Diagnosis not present

## 2024-02-25 DIAGNOSIS — J9611 Chronic respiratory failure with hypoxia: Secondary | ICD-10-CM | POA: Diagnosis not present

## 2024-02-25 DIAGNOSIS — M8000XD Age-related osteoporosis with current pathological fracture, unspecified site, subsequent encounter for fracture with routine healing: Secondary | ICD-10-CM | POA: Diagnosis not present

## 2024-02-25 DIAGNOSIS — E7800 Pure hypercholesterolemia, unspecified: Secondary | ICD-10-CM | POA: Diagnosis not present

## 2024-02-25 DIAGNOSIS — E039 Hypothyroidism, unspecified: Secondary | ICD-10-CM | POA: Diagnosis not present

## 2024-02-25 DIAGNOSIS — J449 Chronic obstructive pulmonary disease, unspecified: Secondary | ICD-10-CM | POA: Diagnosis not present

## 2024-02-26 DIAGNOSIS — S52032A Displaced fracture of olecranon process with intraarticular extension of left ulna, initial encounter for closed fracture: Secondary | ICD-10-CM | POA: Diagnosis not present

## 2024-02-26 DIAGNOSIS — M25522 Pain in left elbow: Secondary | ICD-10-CM | POA: Diagnosis not present

## 2024-03-28 DIAGNOSIS — S52032D Displaced fracture of olecranon process with intraarticular extension of left ulna, subsequent encounter for closed fracture with routine healing: Secondary | ICD-10-CM | POA: Diagnosis not present

## 2024-03-28 DIAGNOSIS — M25522 Pain in left elbow: Secondary | ICD-10-CM | POA: Diagnosis not present

## 2024-03-28 DIAGNOSIS — S52032A Displaced fracture of olecranon process with intraarticular extension of left ulna, initial encounter for closed fracture: Secondary | ICD-10-CM | POA: Diagnosis not present

## 2024-03-31 ENCOUNTER — Ambulatory Visit (HOSPITAL_BASED_OUTPATIENT_CLINIC_OR_DEPARTMENT_OTHER): Admitting: Pulmonary Disease

## 2024-03-31 ENCOUNTER — Encounter (HOSPITAL_BASED_OUTPATIENT_CLINIC_OR_DEPARTMENT_OTHER): Payer: Self-pay | Admitting: Pulmonary Disease

## 2024-03-31 VITALS — BP 118/79 | HR 88 | Ht 59.0 in | Wt 108.0 lb

## 2024-03-31 DIAGNOSIS — J9611 Chronic respiratory failure with hypoxia: Secondary | ICD-10-CM | POA: Diagnosis not present

## 2024-03-31 DIAGNOSIS — J9612 Chronic respiratory failure with hypercapnia: Secondary | ICD-10-CM

## 2024-03-31 DIAGNOSIS — Z87891 Personal history of nicotine dependence: Secondary | ICD-10-CM | POA: Diagnosis not present

## 2024-03-31 DIAGNOSIS — J449 Chronic obstructive pulmonary disease, unspecified: Secondary | ICD-10-CM

## 2024-03-31 MED ORDER — FLUTICASONE FUROATE-VILANTEROL 100-25 MCG/ACT IN AEPB: 1.0000 | INHALATION_SPRAY | Freq: Every day | RESPIRATORY_TRACT | 5 refills | Status: DC

## 2024-03-31 NOTE — Patient Instructions (Signed)
 COPD/Emphysema, GOLD D --REORDERED brand name Breo 100-50 mcg ONE puff ONCE a day. Monitor symptoms and see if this improves dry mouth and eyes --CONTINUE Albuterol  AS NEEDED for shortness of breath or wheezing  Chronic hypoxemic and hypercapneic respiratory failure --Will request compliance report --CONTINUE NIV nightly

## 2024-03-31 NOTE — Progress Notes (Signed)
 Subjective:   PATIENT ID: Victoria Rice GENDER: female DOB: 07-29-1931, MRN: [id number]   HPI  Chief Complaint  Patient presents with   Follow-up    COPD    Reason for Visit: Follow-up  [name] is a 89 year old female with COPD/emphysema, history of neuroendocrine carcinoma in 2017, bronchiectasis, hx stage IA left breast cancer s/p lumpectomy in 2017 who presents for follow-up.  Synopsis: She was previously seen at [facility] with Dr. Fulton Job with the last clinic visit on 10/04/19. She was hospitalized in 12/2021 for acute hypoxemic and hypercarbic respiratory failure with PCO2 106, treated with NIV, antibiotics, bronchodilators, steroids and oxygen. She required 2L at discharge. She completed rehab at [facility]. Currently on Trelegy, home oxygen and BiPAP. At baseline she is able to walk and only uses walker as needed for long distances or rough terrain.   2023 -Started on NIV, Trelegy and O2 after Jan hospitalization. Re-established care with [facility]. No further exacerbations  11/03/22 Presents solo today. Reports she is doing fairly. Using stioloto has been difficult and unclear if she is getting the full medication. Prefers trelegy. Has also noticed that she is having more cough that wasn't present before inhaler change. Compliant with her nightly trelegy. Has not worn daytime oxygen with good saturations >90% with ambulation. No exacerbation since Feb 2023.  04/03/23 Since our last visit she reports her breathing is overall doing well. She has daily coughing fit that she she believes is triggered from sinus drainage. Compliant with Trelegy. Her son recently passed away from an aggressive cancer.   11/30/23 Since our last visit she reports overall doing well. Reports her oxygen 96-98%. She uses her BiPAP nightly but complains of extremely dry mouth and sometimes has a dry cough. Has been using humidifier with distilled water. Compliant with Trelegy 200 but  concerned this is affecting her with dry mouth.   03/31/24 Since our last visit she reports she had the flu in Feb and then shortly after that had broke her left elbow with plan for supportive care and conservative management. Limited in physical therapy due to co-morbidities. She reports worsening cough on generic Breo so has not taken any inhalers in the last few days and feels cough has improved. Continues to have baseline shortness of breath. Compliant with BiPAP nightly   Prior inhalers: Trelegy - Tolerated with benefit however decreased taste in food  Social History: Former smoker. Quit in 1975.   Past Medical History:  Diagnosis Date   Breast cancer (HCC)    lumpectomy, no chemo or XRT; 1 year of anastrazole   Cataract    COPD, severe (HCC) 07/31/2016   Family history of adverse reaction to anesthesia    " my sisiter was unable to move for a while with the spinal anesthesia."   Hypothyroidism    nodule    Neuroendocrine carcinoma of lung (HCC) 10/26/2016   Nodule of left lung    lower lobe   Pneumonia    as an infant   Sebaceous cyst of labia 08/18/2018   Shortness of breath dyspnea    recently increased    Skin cancer    nose- tx. with [facility]    Thyroid  nodule    Vaginal delivery    x2 /w  one being breech     Family History  Problem Relation Age of Onset   Prostate cancer Brother    Lung cancer Brother    Cystic fibrosis Cousin  Cancer Son      Social History   Occupational History   Not on file  Tobacco Use   Smoking status: Former    Current packs/day: 0.00    Average packs/day: 0.8 packs/day for 20.0 years (15.0 ttl pk-yrs)    Types: Cigarettes    Start date: 05/08/1954    Quit date: 05/08/1974    Years since quitting: 49.9   Smokeless tobacco: Never  Vaping Use   Vaping status: Never Used  Substance and Sexual Activity   Alcohol  use: Yes    Alcohol /week: 6.0 standard drinks of alcohol     Types: 6 Glasses of wine per week    Comment: social    Drug use: No   Sexual activity: Never    Allergies  Allergen Reactions   Actonel [Risedronate Sodium] Other (See Comments)    indigestion   Amlodipine  Swelling     Outpatient Medications Prior to Visit  Medication Sig Dispense Refill   cholecalciferol (VITAMIN D) 1000 units tablet Take 1,000 Units by mouth daily.     enalapril (VASOTEC) 2.5 MG tablet Take by mouth.     levothyroxine  (SYNTHROID , LEVOTHROID) 50 MCG tablet Take 50 mcg by mouth daily before breakfast.     naproxen sodium (ANAPROX) 220 MG tablet Take 220 mg by mouth 2 (two) times daily as needed (for pain.).     senna-docusate (SENOKOT-S) 8.6-50 MG tablet Take 1 tablet by mouth 2 (two) times daily.     fluticasone  furoate-vilanterol (BREO ELLIPTA ) 100-25 MCG/ACT AEPB INHALE 1 PUFF BY MOUTH DAILY 60 each 1   Fluticasone -Umeclidin-Vilant (TRELEGY ELLIPTA ) 200-62.5-25 MCG/ACT AEPB Inhale 1 puff into the lungs daily. (Patient not taking: Reported on 03/31/2024) 60 each 5   No facility-administered medications prior to visit.    Review of Systems  Constitutional:  Negative for chills, diaphoresis, fever, malaise/fatigue and weight loss.  HENT:  Negative for congestion.   Respiratory:  Positive for cough. Negative for hemoptysis, sputum production, shortness of breath and wheezing.   Cardiovascular:  Negative for chest pain, palpitations and leg swelling.     Objective:   Vitals:   03/31/24 1415  BP: 118/79  Pulse: 88  SpO2: 94%  Weight: 108 lb (49 kg)  Height: 4\' 11"  (1.499 m)    SpO2: 94 %  Physical Exam: General: Elderly-appearing, no acute distress HENT: [place], AT Eyes: EOMI, no scleral icterus Respiratory: Clear to auscultation bilaterally.  No crackles, wheezing or rales Cardiovascular: RRR, -M/R/G, no JVD Extremities:-Edema,-tenderness Neuro: AAO x4, CNII-XII grossly intact Psych: Normal mood, normal affect  Data Reviewed:  Imaging: CT chest 09/01/2019-mosaicism, emphysema, prominent airway  thickening.  Metallic fiducial marker in left lower lobe.  Nodule in inferior lingula bronchiectasis in the right middle lobe with associated peripheral nodules.  CXR 12/25/21 Bibasilar effusion and atelectasis greater on left  PFT: 07/18/16 FVC 1.24 (65%) FEV1 0.60 (42%) Ratio 49  TLC 117% RV 170% RV/TLC 151% DLCO 80% Interpretation: Severe obstructive defect with significant bronchodilator response. Air trapping present.  Labs:  ABG    Component Value Date/Time   PHART 7.372 12/21/2021 1415   PCO2ART 77.5 (HH) 12/21/2021 1415   PO2ART 70.0 (L) 12/21/2021 1415   HCO3 50.0 (H) 12/25/2021 0935   O2SAT 59.5 12/25/2021 0935   BIPAP Compliance 07/28/22-08/26/22 Usage days 29/30 days (97%) >4 hours 29 days (97%) IPAP 18 EPAP 6 RR 20  BIPAP Compliance 03/03/24-04/01/24 Usage days 29/30 days (97%) >4 hours 28 days (93%) IPAP 18 EPAP 6 RR 20  Assessment & Plan:   Discussion: 89 year old female with COPD/emphysema, bronchiectasis, hx neuroendocrine carcinoma in 2017, hx stage IA left breast cancer s/p lumpectomy. Ambulatory O2 with no desaturations. Counseled on restarting inhalers given the severity of her COPD. Will ensure she receives brand name Breo since this had less impact on cough. Discussed clinical course and management of COPD including bronchodilator regimen, preventive care  and action plan for exacerbation.  COPD/Emphysema, GOLD D --REORDERED brand name Breo 100-50 mcg ONE puff ONCE a day. Monitor symptoms and see if this improves dry mouth and eyes --CONTINUE Albuterol  AS NEEDED for shortness of breath or wheezing  Chronic hypoxemic and hypercapneic respiratory failure --Will request compliance report  Addendum: Patient uses NIV for more than four hours nightly for at least 70% of nights during the last three months of usage. The patient has been using and benefiting from PAP use and will continue to benefit from therapy.    --CONTINUE NIV nightly  Health  Maintenance Immunization History  Administered Date(s) Administered   Fluad Quad(high Dose 65+) 09/06/2021   Influenza Split 09/12/2020   Influenza, High Dose Seasonal PF 09/25/2017, 09/28/2018, 09/20/2019, 08/22/2021   Influenza,inj,Quad PF,6+ Mos 10/15/2016, 09/16/2019   Influenza-Unspecified 10/12/2023   PFIZER(Purple Top)SARS-COV-2 Vaccination 01/01/2020, 01/23/2020, 09/22/2020   Pneumococcal Conjugate-13 05/03/2015   Tdap 07/21/2010   CT Lung Screen- not qualified due to age  No orders of the defined types were placed in this encounter.  Meds ordered this encounter  Medications   fluticasone  furoate-vilanterol (BREO ELLIPTA ) 100-25 MCG/ACT AEPB    Sig: Inhale 1 puff into the lungs daily.    Dispense:  60 each    Refill:  5    Brand name only    Return in about 6 months (around 09/30/2024).  I have spent a total time of 30-minutes on the day of the appointment including chart review, data review, collecting history, coordinating care and discussing medical diagnosis and plan with the patient/family. Past medical history, allergies, medications were reviewed. Pertinent imaging, labs and tests included in this note have been reviewed and interpreted independently by me.  Naveh Rickles Genetta Kenning, MD [facility] 03/31/2024 5:31 PM  Office Number [phone]

## 2024-07-12 ENCOUNTER — Telehealth (HOSPITAL_BASED_OUTPATIENT_CLINIC_OR_DEPARTMENT_OTHER): Payer: Self-pay | Admitting: Pulmonary Disease

## 2024-07-12 NOTE — Telephone Encounter (Signed)
 Patient is scheduled 10/14 @ 11:30 with Dr Kassie. Provider has had a change in schedule and is requesting he come in at 10:45 instead. When patient calls, please confirm this switch and send CRM to [facility] to reschedule as the appointment is being blocked for her.

## 2024-07-18 DIAGNOSIS — Z0000 Encounter for general adult medical examination without abnormal findings: Secondary | ICD-10-CM | POA: Diagnosis not present

## 2024-07-18 DIAGNOSIS — R634 Abnormal weight loss: Secondary | ICD-10-CM | POA: Diagnosis not present

## 2024-07-18 DIAGNOSIS — J432 Centrilobular emphysema: Secondary | ICD-10-CM | POA: Diagnosis not present

## 2024-07-18 DIAGNOSIS — J9611 Chronic respiratory failure with hypoxia: Secondary | ICD-10-CM | POA: Diagnosis not present

## 2024-07-18 DIAGNOSIS — E039 Hypothyroidism, unspecified: Secondary | ICD-10-CM | POA: Diagnosis not present

## 2024-07-18 DIAGNOSIS — Z9981 Dependence on supplemental oxygen: Secondary | ICD-10-CM | POA: Diagnosis not present

## 2024-07-18 DIAGNOSIS — Z6821 Body mass index (BMI) 21.0-21.9, adult: Secondary | ICD-10-CM | POA: Diagnosis not present

## 2024-07-18 DIAGNOSIS — I10 Essential (primary) hypertension: Secondary | ICD-10-CM | POA: Diagnosis not present

## 2024-07-18 DIAGNOSIS — Z131 Encounter for screening for diabetes mellitus: Secondary | ICD-10-CM | POA: Diagnosis not present

## 2024-07-18 DIAGNOSIS — J449 Chronic obstructive pulmonary disease, unspecified: Secondary | ICD-10-CM | POA: Diagnosis not present

## 2024-08-29 DIAGNOSIS — H04123 Dry eye syndrome of bilateral lacrimal glands: Secondary | ICD-10-CM | POA: Diagnosis not present

## 2024-08-29 DIAGNOSIS — H43391 Other vitreous opacities, right eye: Secondary | ICD-10-CM | POA: Diagnosis not present

## 2024-08-29 DIAGNOSIS — H2512 Age-related nuclear cataract, left eye: Secondary | ICD-10-CM | POA: Diagnosis not present

## 2024-08-29 DIAGNOSIS — H5703 Miosis: Secondary | ICD-10-CM | POA: Diagnosis not present

## 2024-09-20 ENCOUNTER — Ambulatory Visit (HOSPITAL_BASED_OUTPATIENT_CLINIC_OR_DEPARTMENT_OTHER): Admitting: Pulmonary Disease

## 2024-09-20 ENCOUNTER — Encounter (HOSPITAL_BASED_OUTPATIENT_CLINIC_OR_DEPARTMENT_OTHER): Payer: Self-pay | Admitting: Pulmonary Disease

## 2024-09-20 VITALS — BP 137/79 | HR 83 | Ht 59.0 in | Wt 105.0 lb

## 2024-09-20 DIAGNOSIS — J449 Chronic obstructive pulmonary disease, unspecified: Secondary | ICD-10-CM

## 2024-09-20 DIAGNOSIS — J9611 Chronic respiratory failure with hypoxia: Secondary | ICD-10-CM

## 2024-09-20 DIAGNOSIS — Z23 Encounter for immunization: Secondary | ICD-10-CM

## 2024-09-20 MED ORDER — TRELEGY ELLIPTA 100-62.5-25 MCG/ACT IN AEPB: 1.0000 | INHALATION_SPRAY | Freq: Every day | RESPIRATORY_TRACT | 1 refills | Status: DC

## 2024-09-20 MED ORDER — TRELEGY ELLIPTA 100-62.5-25 MCG/ACT IN AEPB: 1.0000 | INHALATION_SPRAY | Freq: Every day | RESPIRATORY_TRACT | 0 refills | Status: DC

## 2024-09-20 NOTE — Addendum Note (Signed)
 Addended by: TRUDY WARREN CROME on: 09/20/2024 11:54 AM   Modules accepted: Orders

## 2024-09-20 NOTE — Progress Notes (Signed)
 Subjective:   PATIENT ID: Victoria Rice GENDER: female DOB: Oct 21, 1931, MRN: [id number]   HPI  Chief Complaint  Patient presents with   COPD    Reason for Visit: Follow-up  Victoria Rice is a 89 year old female with COPD/emphysema, history of neuroendocrine carcinoma in 2017, bronchiectasis, hx stage IA left breast cancer s/p lumpectomy in 2017 who presents for follow-up.  Synopsis: She was previously seen at [facility] with Dr. Gretta with the last clinic visit on 10/04/19. She was hospitalized in 12/2021 for acute hypoxemic and hypercarbic respiratory failure with PCO2 106, treated with NIV, antibiotics, bronchodilators, steroids and oxygen. She required 2L at discharge. She completed rehab at [facility]. Currently on Trelegy, home oxygen and BiPAP. At baseline she is able to walk and only uses walker as needed for long distances or rough terrain.   2023 -Started on NIV, Trelegy and O2 after Jan hospitalization. Re-established care with [facility]. No further exacerbations  11/03/22 Presents solo today. Reports she is doing fairly. Using stioloto has been difficult and unclear if she is getting the full medication. Prefers trelegy. Has also noticed that she is having more cough that wasn't present before inhaler change. Compliant with her nightly trelegy. Has not worn daytime oxygen with good saturations >90% with ambulation. No exacerbation since Feb 2023.  04/03/23 Since our last visit she reports her breathing is overall doing well. She has daily coughing fit that she she believes is triggered from sinus drainage. Compliant with Trelegy. Her son recently passed away from an aggressive cancer.   11/30/23 Since our last visit she reports overall doing well. Reports her oxygen 96-98%. She uses her BiPAP nightly but complains of extremely dry mouth and sometimes has a dry cough. Has been using humidifier with distilled water. Compliant with Trelegy 200 but concerned this  is affecting her with dry mouth.   03/31/24 Since our last visit she reports she had the flu in Feb and then shortly after that had broke her left elbow with plan for supportive care and conservative management. Limited in physical therapy due to co-morbidities. She reports worsening cough on generic Breo so has not taken any inhalers in the last few days and feels cough has improved. Continues to have baseline shortness of breath. Compliant with BiPAP nightly   09/20/24 Since our last visit she reports good days and bad days since she has limited her activity due to her elbow injury. Compliant with generic Breo. She feels she may have been better on the Trelegy. She is having shortness of breath. Denies wheezing. Has cough related to sinus drainage. No exacerbations since our last visit. Compliant with BiPAP nightly.  Prior inhalers: Trelegy - Tolerated with benefit however decreased taste in food  Social History: Former smoker. Quit in 1975.   Past Medical History:  Diagnosis Date   Breast cancer (HCC)    lumpectomy, no chemo or XRT; 1 year of anastrazole   Cataract    COPD, severe (HCC) 07/31/2016   Family history of adverse reaction to anesthesia     my sisiter was unable to move for a while with the spinal anesthesia.   Hypothyroidism    nodule    Neuroendocrine carcinoma of lung (HCC) 10/26/2016   Nodule of left lung    lower lobe   Pneumonia    as an infant   Sebaceous cyst of labia 08/18/2018   Shortness of breath dyspnea    recently increased    Skin  cancer    nose- tx. with [facility]    Thyroid  nodule    Vaginal delivery    x2 /w  one being breech     Family History  Problem Relation Age of Onset   Prostate cancer Brother    Lung cancer Brother    Cystic fibrosis Cousin    Cancer Son      Social History   Occupational History   Not on file  Tobacco Use   Smoking status: Former    Current packs/day: 0.00    Average packs/day: 0.8 packs/day for 20.0  years (15.0 ttl pk-yrs)    Types: Cigarettes    Start date: 05/08/1954    Quit date: 05/08/1974    Years since quitting: 50.4   Smokeless tobacco: Never  Vaping Use   Vaping status: Never Used  Substance and Sexual Activity   Alcohol  use: Yes    Alcohol /week: 6.0 standard drinks of alcohol     Types: 6 Glasses of wine per week    Comment: social   Drug use: No   Sexual activity: Never    Allergies  Allergen Reactions   Actonel [Risedronate Sodium] Other (See Comments)    indigestion   Amlodipine  Swelling     Outpatient Medications Prior to Visit  Medication Sig Dispense Refill   cholecalciferol (VITAMIN D) 1000 units tablet Take 1,000 Units by mouth daily.     enalapril (VASOTEC) 2.5 MG tablet Take by mouth.     levothyroxine  (SYNTHROID , LEVOTHROID) 50 MCG tablet Take 50 mcg by mouth daily before breakfast.     naproxen sodium (ANAPROX) 220 MG tablet Take 220 mg by mouth 2 (two) times daily as needed (for pain.).     senna-docusate (SENOKOT-S) 8.6-50 MG tablet Take 1 tablet by mouth 2 (two) times daily.     fluticasone  furoate-vilanterol (BREO ELLIPTA ) 100-25 MCG/ACT AEPB Inhale 1 puff into the lungs daily. 60 each 5   No facility-administered medications prior to visit.    Review of Systems  Constitutional:  Negative for chills, diaphoresis, fever, malaise/fatigue and weight loss.  HENT:  Negative for congestion.   Respiratory:  Positive for shortness of breath. Negative for cough, hemoptysis, sputum production and wheezing.   Cardiovascular:  Negative for chest pain, palpitations and leg swelling.     Objective:   Vitals:   09/20/24 1044  BP: 137/79  Pulse: 83  SpO2: 93%  Weight: 105 lb (47.6 kg)  Height: 4' 11 (1.499 m)    SpO2: 93 %  Physical Exam: General: Elderly-appearing, no acute distress HENT: , AT Eyes: EOMI, no scleral icterus Respiratory: Diminished but clear to auscultation bilaterally.  No crackles, wheezing or rales Cardiovascular: RRR,  -M/R/G, no JVD Extremities:-Edema,-tenderness Neuro: AAO x4, CNII-XII grossly intact Psych: Normal mood, normal affect   Data Reviewed:  Imaging: CT chest 09/01/2019-mosaicism, emphysema, prominent airway thickening.  Metallic fiducial marker in left lower lobe.  Nodule in inferior lingula bronchiectasis in the right middle lobe with associated peripheral nodules.  CXR 12/25/21 Bibasilar effusion and atelectasis greater on left  PFT: 07/18/16 FVC 1.24 (65%) FEV1 0.60 (42%) Ratio 49  TLC 117% RV 170% RV/TLC 151% DLCO 80% Interpretation: Severe obstructive defect with significant bronchodilator response. Air trapping present.  Labs:  ABG    Component Value Date/Time   PHART 7.372 12/21/2021 1415   PCO2ART 77.5 (HH) 12/21/2021 1415   PO2ART 70.0 (L) 12/21/2021 1415   HCO3 50.0 (H) 12/25/2021 0935   O2SAT 59.5 12/25/2021 0935  BIPAP Compliance 08/22/24-09/20/24 Usage days 29/30 days (97%) >4 hours 28 days (93%) AHI 1.0 IPAP 18 EPAP 6 RR 20    Assessment & Plan:   Discussion: 89 year old female with COPD/emphysema, bronchiectasis, hx neuroendocrine carcinoma in 2017, hx stage IA left breast cancer s/p lumpectomy. Previously on brand name Breo with improved cough but reports generic Breo not as effective. Today wanting to go on Trelegy because dyspnea is worse. Discussed clinical course and management of COPD including bronchodilator regimen, preventive care including vaccinations and action plan for exacerbation.  COPD/Emphysema, GOLD D --STOP Breo --START Trelegy 100 ONE puff ONCE a day --Discussed vaccination. Flu shot today  Chronic hypoxemic and hypercapneic respiratory failure --Reviewed compliance report --Patient uses NIV for more than four hours nightly for at least 70% of nights during the last three months of usage. The patient has been using and benefiting from PAP use and will continue to benefit from therapy.  --CONTINUE NIV nightly  Health  Maintenance Immunization History  Administered Date(s) Administered   Fluad Quad(high Dose 65+) 09/06/2021   INFLUENZA, HIGH DOSE SEASONAL PF 09/25/2017, 09/28/2018, 09/20/2019, 08/22/2021   Influenza Split 09/12/2020   Influenza,inj,Quad PF,6+ Mos 10/15/2016, 09/16/2019   Influenza-Unspecified 10/12/2023   PFIZER(Purple Top)SARS-COV-2 Vaccination 01/01/2020, 01/23/2020, 09/22/2020   Pneumococcal Conjugate-13 05/03/2015   Tdap 07/21/2010   CT Lung Screen- not qualified due to age  No orders of the defined types were placed in this encounter.  Meds ordered this encounter  Medications   DISCONTD: Fluticasone -Umeclidin-Vilant (TRELEGY ELLIPTA ) 100-62.5-25 MCG/ACT AEPB    Sig: Inhale 1 puff into the lungs daily.    Dispense:  60 each    Refill:  1   Fluticasone -Umeclidin-Vilant (TRELEGY ELLIPTA ) 100-62.5-25 MCG/ACT AEPB    Sig: Inhale 1 puff into the lungs daily.    Dispense:  180 each    Refill:  0    Return in about 6 months (around 03/21/2025).  I have spent a total time of 30-minutes on the day of the appointment including chart review, data review, collecting history, coordinating care and discussing medical diagnosis and plan with the patient/family. Past medical history, allergies, medications were reviewed. Pertinent imaging, labs and tests included in this note have been reviewed and interpreted independently by me.  Asad Keeven Slater Staff, MD [facility] 09/20/2024 11:32 AM  Office Number [phone]

## 2024-09-20 NOTE — Patient Instructions (Addendum)
 COPD/Emphysema, GOLD D --STOP Breo --START Trelegy 100 ONE puff ONCE a day  Chronic hypoxemic and hypercapneic respiratory failure --Reviewed compliance report --Patient uses NIV for more than four hours nightly for at least 70% of nights during the last three months of usage. The patient has been using and benefiting from PAP use and will continue to benefit from therapy.  --CONTINUE NIV nightly   Flu shot today

## 2024-10-26 ENCOUNTER — Telehealth (HOSPITAL_BASED_OUTPATIENT_CLINIC_OR_DEPARTMENT_OTHER): Payer: Self-pay

## 2024-10-26 MED ORDER — TRELEGY ELLIPTA 100-62.5-25 MCG/ACT IN AEPB: 1.0000 | INHALATION_SPRAY | Freq: Every day | RESPIRATORY_TRACT | 3 refills | Status: DC

## 2024-10-26 NOTE — Telephone Encounter (Signed)
 Copied from CRM [id number]. Topic: General - Other >> Oct 25, 2024  3:50 PM Dustin F wrote: Reason for CRM: Pt switched from Breo to Fluticasone -Umeclidin-Vilant (TRELEGY ELLIPTA ) 100-62.5-25 MCG/ACT AEPB [[id number]] and pt stated Dr. Kassie wanted her to relay if she wanted to stay on the new Fluticasone -Umeclidin-Vilant (TRELEGY ELLIPTA ) 100-62.5-25 MCG/ACT AEPB [[id number]] or not. Pt stated she prefers to stay on Trelegy ellipta .

## 2024-11-01 DIAGNOSIS — J029 Acute pharyngitis, unspecified: Secondary | ICD-10-CM | POA: Diagnosis not present

## 2024-11-01 DIAGNOSIS — J069 Acute upper respiratory infection, unspecified: Secondary | ICD-10-CM | POA: Diagnosis not present

## 2024-11-04 ENCOUNTER — Inpatient Hospital Stay (HOSPITAL_COMMUNITY)
Admission: EM | Admit: 2024-11-04 | Discharge: 2024-11-09 | DRG: 189 | Disposition: A | Source: Skilled Nursing Facility | Attending: Family Medicine | Admitting: Family Medicine

## 2024-11-04 ENCOUNTER — Other Ambulatory Visit: Payer: Self-pay

## 2024-11-04 ENCOUNTER — Emergency Department (HOSPITAL_COMMUNITY)

## 2024-11-04 DIAGNOSIS — E871 Hypo-osmolality and hyponatremia: Secondary | ICD-10-CM | POA: Diagnosis present

## 2024-11-04 DIAGNOSIS — J9601 Acute respiratory failure with hypoxia: Secondary | ICD-10-CM | POA: Diagnosis present

## 2024-11-04 DIAGNOSIS — R0602 Shortness of breath: Secondary | ICD-10-CM | POA: Diagnosis not present

## 2024-11-04 DIAGNOSIS — J9691 Respiratory failure, unspecified with hypoxia: Secondary | ICD-10-CM | POA: Diagnosis present

## 2024-11-04 DIAGNOSIS — J441 Chronic obstructive pulmonary disease with (acute) exacerbation: Principal | ICD-10-CM

## 2024-11-04 DIAGNOSIS — I10 Essential (primary) hypertension: Secondary | ICD-10-CM | POA: Diagnosis present

## 2024-11-04 DIAGNOSIS — K219 Gastro-esophageal reflux disease without esophagitis: Secondary | ICD-10-CM | POA: Diagnosis present

## 2024-11-04 DIAGNOSIS — I700 Atherosclerosis of aorta: Secondary | ICD-10-CM | POA: Diagnosis not present

## 2024-11-04 DIAGNOSIS — E559 Vitamin D deficiency, unspecified: Secondary | ICD-10-CM | POA: Insufficient documentation

## 2024-11-04 DIAGNOSIS — R0902 Hypoxemia: Secondary | ICD-10-CM | POA: Diagnosis not present

## 2024-11-04 DIAGNOSIS — I872 Venous insufficiency (chronic) (peripheral): Secondary | ICD-10-CM | POA: Insufficient documentation

## 2024-11-04 DIAGNOSIS — J209 Acute bronchitis, unspecified: Secondary | ICD-10-CM

## 2024-11-04 DIAGNOSIS — J90 Pleural effusion, not elsewhere classified: Secondary | ICD-10-CM | POA: Diagnosis not present

## 2024-11-04 DIAGNOSIS — I5030 Unspecified diastolic (congestive) heart failure: Secondary | ICD-10-CM | POA: Diagnosis present

## 2024-11-04 DIAGNOSIS — F419 Anxiety disorder, unspecified: Secondary | ICD-10-CM | POA: Diagnosis present

## 2024-11-04 DIAGNOSIS — J189 Pneumonia, unspecified organism: Secondary | ICD-10-CM | POA: Diagnosis not present

## 2024-11-04 DIAGNOSIS — E7800 Pure hypercholesterolemia, unspecified: Secondary | ICD-10-CM | POA: Diagnosis present

## 2024-11-04 DIAGNOSIS — E041 Nontoxic single thyroid nodule: Secondary | ICD-10-CM | POA: Insufficient documentation

## 2024-11-04 DIAGNOSIS — M8000XD Age-related osteoporosis with current pathological fracture, unspecified site, subsequent encounter for fracture with routine healing: Secondary | ICD-10-CM | POA: Insufficient documentation

## 2024-11-04 DIAGNOSIS — Z9981 Dependence on supplemental oxygen: Secondary | ICD-10-CM

## 2024-11-04 DIAGNOSIS — R0600 Dyspnea, unspecified: Secondary | ICD-10-CM | POA: Diagnosis not present

## 2024-11-04 DIAGNOSIS — J9621 Acute and chronic respiratory failure with hypoxia: Secondary | ICD-10-CM | POA: Diagnosis not present

## 2024-11-04 DIAGNOSIS — E039 Hypothyroidism, unspecified: Secondary | ICD-10-CM | POA: Diagnosis present

## 2024-11-04 DIAGNOSIS — J449 Chronic obstructive pulmonary disease, unspecified: Secondary | ICD-10-CM | POA: Diagnosis present

## 2024-11-04 LAB — CBC WITH DIFFERENTIAL/PLATELET
Abs Immature Granulocytes: 0.05 K/uL (ref 0.00–0.07)
Basophils Absolute: 0 K/uL (ref 0.0–0.1)
Basophils Relative: 0 %
Eosinophils Absolute: 0 K/uL (ref 0.0–0.5)
Eosinophils Relative: 0 %
HCT: 44.5 % (ref 36.0–46.0)
Hemoglobin: 14.3 g/dL (ref 12.0–15.0)
Immature Granulocytes: 0 %
Lymphocytes Relative: 6 %
Lymphs Abs: 0.8 K/uL (ref 0.7–4.0)
MCH: 29.6 pg (ref 26.0–34.0)
MCHC: 32.1 g/dL (ref 30.0–36.0)
MCV: 92.1 fL (ref 80.0–100.0)
Monocytes Absolute: 2 K/uL — ABNORMAL HIGH (ref 0.1–1.0)
Monocytes Relative: 15 %
Neutro Abs: 10.8 K/uL — ABNORMAL HIGH (ref 1.7–7.7)
Neutrophils Relative %: 79 %
Platelets: 297 K/uL (ref 150–400)
RBC: 4.83 MIL/uL (ref 3.87–5.11)
RDW: 12.5 % (ref 11.5–15.5)
WBC: 13.8 K/uL — ABNORMAL HIGH (ref 4.0–10.5)
nRBC: 0 % (ref 0.0–0.2)

## 2024-11-04 LAB — RESP PANEL BY RT-PCR (RSV, FLU A&B, COVID)  RVPGX2
Influenza A by PCR: NEGATIVE
Influenza B by PCR: NEGATIVE
Resp Syncytial Virus by PCR: NEGATIVE
SARS Coronavirus 2 by RT PCR: NEGATIVE

## 2024-11-04 LAB — BASIC METABOLIC PANEL WITH GFR
Anion gap: 10 (ref 5–15)
BUN: 13 mg/dL (ref 8–23)
CO2: 28 mmol/L (ref 22–32)
Calcium: 9.4 mg/dL (ref 8.9–10.3)
Chloride: 89 mmol/L — ABNORMAL LOW (ref 98–111)
Creatinine, Ser: 0.66 mg/dL (ref 0.44–1.00)
GFR, Estimated: >60 mL/min (ref >60)
Glucose, Bld: 139 mg/dL — ABNORMAL HIGH (ref 70–99)
Potassium: 4.4 mmol/L (ref 3.5–5.1)
Sodium: 127 mmol/L — ABNORMAL LOW (ref 135–145)

## 2024-11-04 LAB — TSH: TSH: 0.454 u[IU]/mL (ref 0.350–4.500)

## 2024-11-04 LAB — PRO BRAIN NATRIURETIC PEPTIDE: Pro Brain Natriuretic Peptide: 554 pg/mL — ABNORMAL HIGH (ref <300.0)

## 2024-11-04 LAB — D-DIMER, QUANTITATIVE: D-Dimer, Quant: 0.61 ug{FEU}/mL — ABNORMAL HIGH (ref 0.00–0.50)

## 2024-11-04 LAB — TROPONIN T, HIGH SENSITIVITY
Troponin T High Sensitivity: 17 ng/L (ref 0–19)
Troponin T High Sensitivity: 17 ng/L (ref 0–19)

## 2024-11-04 LAB — I-STAT CG4 LACTIC ACID, ED: Lactic Acid, Venous: 0.9 mmol/L (ref 0.5–1.9)

## 2024-11-04 MED ORDER — IPRATROPIUM-ALBUTEROL 0.5-2.5 (3) MG/3ML IN SOLN
3.0000 mL | Freq: Four times a day (QID) | RESPIRATORY_TRACT | Status: DC
  Administered 2024-11-04: 3 mL via RESPIRATORY_TRACT
  Filled 2024-11-04: qty 3

## 2024-11-04 MED ORDER — SODIUM CHLORIDE 0.9 % IV SOLN: 250.0000 mL | INTRAVENOUS | Status: AC | PRN

## 2024-11-04 MED ORDER — ENOXAPARIN SODIUM 30 MG/0.3ML IJ SOSY
30.0000 mg | PREFILLED_SYRINGE | INTRAMUSCULAR | Status: DC
  Administered 2024-11-04 – 2024-11-08 (×5): 30 mg via SUBCUTANEOUS
  Filled 2024-11-04 (×5): qty 0.3

## 2024-11-04 MED ORDER — METOPROLOL TARTRATE 5 MG/5ML IV SOLN: 5.0000 mg | INTRAVENOUS | Status: DC | PRN

## 2024-11-04 MED ORDER — METHYLPREDNISOLONE SODIUM SUCC 125 MG IJ SOLR
125.0000 mg | Freq: Once | INTRAMUSCULAR | Status: AC
  Administered 2024-11-04: 125 mg via INTRAVENOUS
  Filled 2024-11-04: qty 2

## 2024-11-04 MED ORDER — SODIUM CHLORIDE 0.9% FLUSH
3.0000 mL | Freq: Two times a day (BID) | INTRAVENOUS | Status: DC
  Administered 2024-11-04 – 2024-11-09 (×9): 3 mL via INTRAVENOUS

## 2024-11-04 MED ORDER — IPRATROPIUM-ALBUTEROL 0.5-2.5 (3) MG/3ML IN SOLN
3.0000 mL | Freq: Once | RESPIRATORY_TRACT | Status: AC
  Administered 2024-11-04: 3 mL via RESPIRATORY_TRACT
  Filled 2024-11-04: qty 3

## 2024-11-04 MED ORDER — POLYETHYLENE GLYCOL 3350 17 G PO PACK: 17.0000 g | PACK | Freq: Every day | ORAL | Status: DC | PRN

## 2024-11-04 MED ORDER — LEVOTHYROXINE SODIUM 50 MCG PO TABS
50.0000 ug | ORAL_TABLET | Freq: Every day | ORAL | Status: DC
  Administered 2024-11-05 – 2024-11-09 (×5): 50 ug via ORAL
  Filled 2024-11-04 (×5): qty 1

## 2024-11-04 MED ORDER — METHYLPREDNISOLONE SODIUM SUCC 125 MG IJ SOLR
125.0000 mg | Freq: Two times a day (BID) | INTRAMUSCULAR | Status: AC
  Administered 2024-11-05 (×2): 125 mg via INTRAVENOUS
  Filled 2024-11-04 (×2): qty 2

## 2024-11-04 MED ORDER — ONDANSETRON HCL 4 MG PO TABS: 4.0000 mg | ORAL_TABLET | Freq: Four times a day (QID) | ORAL | Status: DC | PRN

## 2024-11-04 MED ORDER — SODIUM CHLORIDE 0.9 % IV SOLN: 1.0000 g | Freq: Every day | INTRAVENOUS | Status: DC

## 2024-11-04 MED ORDER — PREDNISONE 20 MG PO TABS: 40.0000 mg | ORAL_TABLET | Freq: Every day | ORAL | Status: DC

## 2024-11-04 MED ORDER — ONDANSETRON HCL 4 MG/2ML IJ SOLN: 4.0000 mg | Freq: Four times a day (QID) | INTRAMUSCULAR | Status: DC | PRN

## 2024-11-04 MED ORDER — ACETAMINOPHEN 650 MG RE SUPP: 650.0000 mg | Freq: Four times a day (QID) | RECTAL | Status: DC | PRN

## 2024-11-04 MED ORDER — SODIUM CHLORIDE 0.9 % IV BOLUS
500.0000 mL | Freq: Once | INTRAVENOUS | Status: AC
  Administered 2024-11-04: 500 mL via INTRAVENOUS

## 2024-11-04 MED ORDER — SODIUM CHLORIDE 0.9 % IV SOLN
500.0000 mg | Freq: Once | INTRAVENOUS | Status: AC
  Administered 2024-11-04: 500 mg via INTRAVENOUS
  Filled 2024-11-04: qty 5

## 2024-11-04 MED ORDER — SODIUM CHLORIDE 0.9% FLUSH: 3.0000 mL | INTRAVENOUS | Status: DC | PRN

## 2024-11-04 MED ORDER — VITAMIN D 25 MCG (1000 UNIT) PO TABS
1000.0000 [IU] | ORAL_TABLET | Freq: Every day | ORAL | Status: DC
  Administered 2024-11-05 – 2024-11-08 (×4): 1000 [IU] via ORAL
  Filled 2024-11-04 (×4): qty 1

## 2024-11-04 MED ORDER — SODIUM CHLORIDE 0.9 % IV SOLN
1.0000 g | Freq: Once | INTRAVENOUS | Status: AC
  Administered 2024-11-04: 1 g via INTRAVENOUS
  Filled 2024-11-04: qty 10

## 2024-11-04 MED ORDER — ENALAPRIL MALEATE 2.5 MG PO TABS
2.5000 mg | ORAL_TABLET | Freq: Every day | ORAL | Status: DC
  Administered 2024-11-05 – 2024-11-09 (×5): 2.5 mg via ORAL
  Filled 2024-11-04 (×5): qty 1

## 2024-11-04 MED ORDER — BUDESON-GLYCOPYRROL-FORMOTEROL 160-9-4.8 MCG/ACT IN AERO
2.0000 | INHALATION_SPRAY | Freq: Two times a day (BID) | RESPIRATORY_TRACT | Status: DC
  Administered 2024-11-05 – 2024-11-09 (×9): 2 via RESPIRATORY_TRACT
  Filled 2024-11-04: qty 5.9

## 2024-11-04 MED ORDER — ACETAMINOPHEN 325 MG PO TABS
650.0000 mg | ORAL_TABLET | Freq: Four times a day (QID) | ORAL | Status: DC | PRN
  Administered 2024-11-07: 650 mg via ORAL
  Filled 2024-11-04: qty 2

## 2024-11-04 NOTE — Assessment & Plan Note (Addendum)
 Marked, likely related to COPD exacerbation plus or minus some CHFpEF.  BiPAP will likely help with that. IV steroids IV Rocephin Continue Trelegy Continue DuoNebs

## 2024-11-04 NOTE — ED Notes (Signed)
 Respiratory called to help assist patient with Bipap up to the floor.

## 2024-11-04 NOTE — H&P (Signed)
 History and Physical    Patient: Victoria Rice FMW:[id number] DOB: 03-18-1931 DOA: 11/04/2024 DOS: the patient was seen and examined on 11/04/2024 PCP: Aisha Harvey, MD  Patient coming from: ALF/ILF patient lives at [facility]  Chief Complaint: No chief complaint on file.  HPI: Victoria Rice is a 89 y.o. female with medical history significant of COPD, HTN, hypothyroidism, breast cancer, neuroendocrine carcinoma of the lung, skin cancer who has had increasing shortness of breath the last few days.  Went to the urgent care on 1125 diagnosed with a viral URI and given no meds.  She had increasing work of breathing over the last 24 to 48 hours and came in where she was initially found to be quite hypoxic with oxygen saturations in the 60s and eventually had to be put on BiPAP given her oxygen saturation where it need to be.  The patient does use BiPAP while she sleeps but has not been on home O2.  In the ED she was noted to have mildly elevated D-dimer at 0.61, normal troponins, elevated white count at 13.8, and proBNP of 554.  She denies significant lower extremity swelling, she did report poor p.o. intake.  She has had cough that brings up mucus but no hemoptysis.  She has been using her Trelegy inhaler as recommended.  She was also found to have a negative respiratory panel and hyponatremia in the ED.  Review of Systems: As mentioned in the history of present illness. All other systems reviewed and are negative. Past Medical History:  Diagnosis Date   Breast cancer (HCC)    lumpectomy, no chemo or XRT; 1 year of anastrazole   Cataract    COPD, severe (HCC) 07/31/2016   Family history of adverse reaction to anesthesia     my sisiter was unable to move for a while with the spinal anesthesia.   Hypothyroidism    nodule    Neuroendocrine carcinoma of lung (HCC) 10/26/2016   Nodule of left lung    lower lobe   Pneumonia    as an infant   Sebaceous cyst of labia 08/18/2018    Shortness of breath dyspnea    recently increased    Skin cancer    nose- tx. with [facility]    Thyroid  nodule    Vaginal delivery    x2 /w  one being breech   Past Surgical History:  Procedure Laterality Date   BREAST LUMPECTOMY Right 1970's   R breast   BREAST LUMPECTOMY Bilateral 07/23/2016   BREAST LUMPECTOMY WITH NEEDLE LOCALIZATION Left 07/23/2016   Procedure: LEFT BREAST LUMPECTOMY WITH DOUBLE NEEDLE LOCALIZATION;  Surgeon: Elon Pacini, MD;  Location: [facility] OR;  Service: General;  Laterality: Left;   BREAST LUMPECTOMY WITH RADIOACTIVE SEED LOCALIZATION Right 07/23/2016   Procedure: RIGHT BREAST LUMPECTOMY WITH RADIOACTIVE SEED LOCALIZATION;  Surgeon: Elon Pacini, MD;  Location: [facility] OR;  Service: General;  Laterality: Right;   CATARACT EXTRACTION W/ INTRAOCULAR LENS IMPLANT     right eye   EYE SURGERY Right    /w IOL- cataract removed    FUDUCIAL PLACEMENT N/A 08/25/2016   Procedure: PLACEMENT OF FUDUCIAL;  Surgeon: Elspeth JAYSON Millers, MD;  Location: [facility] OR;  Service: Thoracic;  Laterality: N/A;   VARICOSE VEIN SURGERY Right    w/o stitches on R leg   VIDEO BRONCHOSCOPY WITH ENDOBRONCHIAL NAVIGATION N/A 08/25/2016   Procedure: VIDEO BRONCHOSCOPY WITH ENDOBRONCHIAL NAVIGATION;  Surgeon: Elspeth JAYSON Millers, MD;  Location: [facility] OR;  Service: Thoracic;  Laterality: N/A;   Social History:  reports that she quit smoking about 50 years ago. Her smoking use included cigarettes. She started smoking about 70 years ago. She has a 15 pack-year smoking history. She has never used smokeless tobacco. She reports current alcohol  use of about 6.0 standard drinks of alcohol  per week. She reports that she does not use drugs.  Allergies  Allergen Reactions   Actonel [Risedronate Sodium] Other (See Comments)    indigestion   Amlodipine  Swelling    Family History  Problem Relation Age of Onset   Prostate cancer Brother    Lung cancer Brother    Cystic fibrosis Cousin    Cancer Son      Prior to Admission medications   Medication Sig Start Date End Date Taking? Authorizing Provider  cholecalciferol (VITAMIN D) 1000 units tablet Take 1,000 Units by mouth daily with lunch.   Yes [provider]  enalapril (VASOTEC) 2.5 MG tablet Take 2.5 mg by mouth daily. 02/14/22  Yes [provider]  Fluticasone -Umeclidin-Vilant (TRELEGY ELLIPTA ) 100-62.5-25 MCG/ACT AEPB Inhale 1 puff into the lungs daily. 10/26/24  Yes Kassie Acquanetta Bradley, MD  levothyroxine  (SYNTHROID , LEVOTHROID) 50 MCG tablet Take 50 mcg by mouth daily before breakfast.   Yes [provider]  naproxen sodium (ANAPROX) 220 MG tablet Take 220 mg by mouth 2 (two) times daily as needed (for pain.).   Yes [provider]  senna-docusate (SENOKOT-S) 8.6-50 MG tablet Take 1 tablet by mouth 2 (two) times daily. Patient taking differently: Take 2 tablets by mouth at bedtime as needed for mild constipation. 12/30/21  Yes Austria, Eric J, DO    Physical Exam: Vitals:   11/04/24 1600 11/04/24 1602 11/04/24 1615 11/04/24 1630  BP:  (!) 186/93    Pulse: 97 99 96 94  Resp: (!) 22 (!) 25 18 (!) 22  Temp:      TempSrc:      SpO2: 97% 95% 93% 92%   Physical Examination: General appearance - in mild to moderate distress and ill-appearing Chest - wheezing noted throughout, rales noted lower lobes Heart - normal rate, regular rhythm, normal S1, S2, no murmurs, rubs, clicks or gallops Abdomen - soft, nontender, nondistended, no masses or organomegaly Extremities -no pedal edema  Skin - normal coloration and turgor, no rashes, no suspicious skin lesions noted  Data Reviewed: Results for orders placed or performed during the hospital encounter of 11/04/24 (from the past 24 hours)  Culture, blood (routine x 2)     Status: None (Preliminary result)   Collection Time: 11/04/24  3:25 PM   Specimen: BLOOD RIGHT FOREARM  Result Value Ref Range   Specimen Description      BLOOD RIGHT FOREARM Performed  at [facility], [address], [place], [place] [address]    Special Requests      BOTTLES DRAWN AEROBIC AND ANAEROBIC Blood Culture results may not be optimal due to an inadequate volume of blood received in culture bottles Performed at [facility], [address], [place], [place] [address]    Culture PENDING    Report Status PENDING   Resp panel by RT-PCR (RSV, Flu A&B, Covid) Anterior Nasal Swab     Status: None   Collection Time: 11/04/24  3:33 PM   Specimen: Anterior Nasal Swab  Result Value Ref Range   SARS Coronavirus 2 by RT PCR NEGATIVE NEGATIVE   Influenza A by PCR NEGATIVE NEGATIVE   Influenza B by PCR  NEGATIVE NEGATIVE   Resp Syncytial Virus by PCR NEGATIVE NEGATIVE  Basic metabolic panel     Status: Abnormal   Collection Time: 11/04/24  3:37 PM  Result Value Ref Range   Sodium 127 (L) 135 - 145 mmol/L   Potassium 4.4 3.5 - 5.1 mmol/L   Chloride 89 (L) 98 - 111 mmol/L   CO2 28 22 - 32 mmol/L   Glucose, Bld 139 (H) 70 - 99 mg/dL   BUN 13 8 - 23 mg/dL   Creatinine, Ser 9.33 0.44 - 1.00 mg/dL   Calcium  9.4 8.9 - 10.3 mg/dL   GFR, Estimated >39 >39 mL/min   Anion gap 10 5 - 15  Pro Brain natriuretic peptide     Status: Abnormal   Collection Time: 11/04/24  3:37 PM  Result Value Ref Range   Pro Brain Natriuretic Peptide 554.0 (H) <300.0 pg/mL  CBC with Differential     Status: Abnormal   Collection Time: 11/04/24  3:37 PM  Result Value Ref Range   WBC 13.8 (H) 4.0 - 10.5 K/uL   RBC 4.83 3.87 - 5.11 MIL/uL   Hemoglobin 14.3 12.0 - 15.0 g/dL   HCT 55.4 63.9 - 53.9 %   MCV 92.1 80.0 - 100.0 fL   MCH 29.6 26.0 - 34.0 pg   MCHC 32.1 30.0 - 36.0 g/dL   RDW 87.4 88.4 - 84.4 %   Platelets 297 150 - 400 K/uL   nRBC 0.0 0.0 - 0.2 %   Neutrophils Relative % 79 %   Neutro Abs 10.8 (H) 1.7 - 7.7 K/uL   Lymphocytes Relative 6 %   Lymphs Abs 0.8 0.7 - 4.0 K/uL   Monocytes Relative 15 %   Monocytes Absolute 2.0 (H) 0.1 - 1.0 K/uL   Eosinophils  Relative 0 %   Eosinophils Absolute 0.0 0.0 - 0.5 K/uL   Basophils Relative 0 %   Basophils Absolute 0.0 0.0 - 0.1 K/uL   Immature Granulocytes 0 %   Abs Immature Granulocytes 0.05 0.00 - 0.07 K/uL  Troponin T, High Sensitivity     Status: None   Collection Time: 11/04/24  3:37 PM  Result Value Ref Range   Troponin T High Sensitivity 17 0 - 19 ng/L  D-dimer, quantitative     Status: Abnormal   Collection Time: 11/04/24  3:37 PM  Result Value Ref Range   D-Dimer, Quant 0.61 (H) 0.00 - 0.50 ug/mL-FEU  I-Stat Lactic Acid     Status: None   Collection Time: 11/04/24  3:51 PM  Result Value Ref Range   Lactic Acid, Venous 0.9 0.5 - 1.9 mmol/L   DG Chest Port 1 View Result Date: 11/04/2024 CLINICAL DATA:  Short of breath. EXAM: PORTABLE CHEST 1 VIEW COMPARISON:  01/15/2024. FINDINGS: Cardiac silhouette is normal size.  No mediastinal or hilar masses. Lungs are hyperexpanded with flattened hemidiaphragms. Mild interstitial prominence mostly at the lung bases. No evidence of pneumonia or pulmonary edema. No pleural effusion or pneumothorax. Surgical vascular clips overlying both breast consistent with IMPRESSION: No active disease. Electronically Signed   By: Alm Parkins M.D.   On: 11/04/2024 16:29     Assessment and Plan: Hypothyroidism Continue Synthroid  Check TSH  Hypercholesterolemia Low-cholesterol diet  Essential hypertension Continue Vasotec  Acute respiratory failure with hypoxia (HCC) Marked, likely related to COPD exacerbation plus or minus some CHFpEF.  BiPAP will likely help with that. IV steroids IV Rocephin Continue Trelegy Continue DuoNebs   Hyponatremia Mild and likely secondary  to poor p.o. intake IV fluid repletion Trend  COPD, severe (HCC) On BiPAP at night Continue Trelegy DuoNebs as needed      Advance Care Planning:   Code Status: Prior confirmed with patient  Consults: None  Family Communication: Daughter at bedside  Severity of  Illness: The appropriate patient status for this patient is INPATIENT. Inpatient status is judged to be reasonable and necessary in order to provide the required intensity of service to ensure the patient's safety. The patient's presenting symptoms, physical exam findings, and initial radiographic and laboratory data in the context of their chronic comorbidities is felt to place them at high risk for further clinical deterioration. Furthermore, it is not anticipated that the patient will be medically stable for discharge from the hospital within 2 midnights of admission.   * I certify that at the point of admission it is my clinical judgment that the patient will require inpatient hospital care spanning beyond 2 midnights from the point of admission due to high intensity of service, high risk for further deterioration and high frequency of surveillance required.*  Author: Glenys GORMAN Birk, MD 11/04/2024 5:02 PM  For on call review [email or web address].

## 2024-11-04 NOTE — ED Triage Notes (Signed)
 Patient complains of SOB and difficulty breathing. Seen in [facility] on Tuesday. Diagnosed with viral URI. Hx of COPD. On bipap at night. Sats 68% RA at triage and patient labored. BP elevated. Patient has productive cough.

## 2024-11-04 NOTE — Assessment & Plan Note (Addendum)
 On BiPAP at night Continue Trelegy DuoNebs as needed

## 2024-11-04 NOTE — Assessment & Plan Note (Signed)
Low cholesterol diet

## 2024-11-04 NOTE — Assessment & Plan Note (Signed)
 Mild and likely secondary to poor p.o. intake IV fluid repletion Trend

## 2024-11-04 NOTE — Assessment & Plan Note (Signed)
 Continue Synthroid.  Check TSH.

## 2024-11-04 NOTE — Progress Notes (Signed)
 Assisted with transporting PT to [facility] 1402 while on 2 lpm nasal cannula (trying off BiPAP). PT tolerated transport well and vitals are stable upon arriving to 1402 and PT states she is breathing much better. BiPAP order has been changed to PRN and BiPAP remains at bedside.

## 2024-11-04 NOTE — Hospital Course (Signed)
 89 year old female with history of COPD, HTN, hypothyroidism, breast cancer, neuroendocrine carcinoma of the lung, skin cancer who has had increasing shortness of breath the last few days.  Went to the urgent care on 1125 diagnosed with a viral URI and given no meds.  She had increasing work of breathing over the last 24 to 48 hours and came in where she was initially found to be quite hypoxic with oxygen saturations in the 60s and eventually had to be put on BiPAP given her oxygen saturation where it need to be.  The patient does use BiPAP while she sleeps but has not been on home O2.  In the ED she was noted to have mildly elevated D-dimer at 0.61, normal troponins, elevated white count at 13.8, and proBNP of 554.  She denies significant lower extremity swelling, she did report poor p.o. intake.  She has had cough that brings up mucus but no hemoptysis.  She has been using her Trelegy inhaler as recommended.  She was also found to have a negative respiratory panel and hyponatremia in the ED.

## 2024-11-04 NOTE — Assessment & Plan Note (Signed)
-   Continue Vasotec

## 2024-11-04 NOTE — ED Notes (Signed)
 Respiratory called again to assist in taking the patient on Bipap upstairs on the floor.

## 2024-11-04 NOTE — Progress Notes (Signed)
 Asked ED Secretary to notify RN to leave PT door open so BiPAP alarms remains audible to staff.

## 2024-11-04 NOTE — ED Provider Notes (Signed)
 [place] EMERGENCY DEPARTMENT AT [facility] Provider Note   CSN: [id number] Arrival date & time: 11/04/24  1501     Patient presents with: No chief complaint on file.   Victoria Rice is a 89 y.o. female.   Pt is a 89 yo female with pmhx significant for COPD, HTN, hypothyroidism, breast cancer, neuroendocrine carcinoma of lung, and skin cancer.  Pt has had sob for the past few days.  She did go to UC on 11/25 and was dx'd with a viral URI.  She reports that she was not given any meds at d/c.  She has continued to get worse.  She's had increased SOB and cough.  She went back to PCP today and was sent to the ED for further eval.  She was 68% on RA at triage.  She was put on 4L and O2 sat is up to the mid to upper 90s.        Prior to Admission medications   Medication Sig Start Date End Date Taking? Authorizing Provider  cholecalciferol (VITAMIN D) 1000 units tablet Take 1,000 Units by mouth daily with lunch.   Yes [provider]  enalapril (VASOTEC) 2.5 MG tablet Take 2.5 mg by mouth daily. 02/14/22  Yes [provider]  Fluticasone -Umeclidin-Vilant (TRELEGY ELLIPTA ) 100-62.5-25 MCG/ACT AEPB Inhale 1 puff into the lungs daily. 10/26/24  Yes Kassie Acquanetta Bradley, MD  levothyroxine  (SYNTHROID , LEVOTHROID) 50 MCG tablet Take 50 mcg by mouth daily before breakfast.   Yes [provider]  naproxen sodium (ANAPROX) 220 MG tablet Take 220 mg by mouth 2 (two) times daily as needed (for pain.).   Yes [provider]  senna-docusate (SENOKOT-S) 8.6-50 MG tablet Take 1 tablet by mouth 2 (two) times daily. Patient taking differently: Take 2 tablets by mouth at bedtime as needed for mild constipation. 12/30/21  Yes Austria, Camellia PARAS, DO    Allergies: Actonel [risedronate sodium] and Amlodipine     Review of Systems  Respiratory:  Positive for cough and shortness of breath.   All other systems reviewed and are negative.   Updated Vital Signs BP  (!) 186/93   Pulse 94   Temp 98.4 F (36.9 C) (Oral)   Resp (!) 22   SpO2 92%   Physical Exam Vitals and nursing note reviewed.  Constitutional:      General: She is in acute distress.     Appearance: She is ill-appearing.  HENT:     Head: Normocephalic and atraumatic.     Right Ear: External ear normal.     Left Ear: External ear normal.     Nose: Nose normal.     Mouth/Throat:     Mouth: Mucous membranes are moist.     Pharynx: Oropharynx is clear.  Eyes:     Extraocular Movements: Extraocular movements intact.     Conjunctiva/sclera: Conjunctivae normal.     Pupils: Pupils are equal, round, and reactive to light.  Cardiovascular:     Rate and Rhythm: Regular rhythm. Tachycardia present.     Pulses: Normal pulses.     Heart sounds: Normal heart sounds.  Pulmonary:     Effort: Tachypnea and respiratory distress present.     Breath sounds: Wheezing present.  Abdominal:     General: Abdomen is flat. Bowel sounds are normal.     Palpations: Abdomen is soft.  Musculoskeletal:        General: Normal range of motion.     Cervical back: Normal range  of motion and neck supple.  Skin:    General: Skin is warm.     Capillary Refill: Capillary refill takes less than 2 seconds.  Neurological:     General: No focal deficit present.     Mental Status: She is alert and oriented to person, place, and time.  Psychiatric:        Mood and Affect: Mood normal.        Behavior: Behavior normal.     (all labs ordered are listed, but only abnormal results are displayed) Labs Reviewed  BASIC METABOLIC PANEL WITH GFR - Abnormal; Notable for the following components:      Result Value   Sodium 127 (*)    Chloride 89 (*)    Glucose, Bld 139 (*)    All other components within normal limits  PRO BRAIN NATRIURETIC PEPTIDE - Abnormal; Notable for the following components:   Pro Brain Natriuretic Peptide 554.0 (*)    All other components within normal limits  CBC WITH  DIFFERENTIAL/PLATELET - Abnormal; Notable for the following components:   WBC 13.8 (*)    Neutro Abs 10.8 (*)    Monocytes Absolute 2.0 (*)    All other components within normal limits  D-DIMER, QUANTITATIVE - Abnormal; Notable for the following components:   D-Dimer, Quant 0.61 (*)    All other components within normal limits  RESP PANEL BY RT-PCR (RSV, FLU A&B, COVID)  [id number]  CULTURE, BLOOD (ROUTINE X 2)  CULTURE, BLOOD (ROUTINE X 2)  I-STAT CG4 LACTIC ACID, ED  TROPONIN T, HIGH SENSITIVITY    EKG: EKG Interpretation Date/Time:  Friday November 04 2024 15:40:07 EST Ventricular Rate:  103 PR Interval:  176 QRS Duration:  92 QT Interval:  332 QTC Calculation: 433 R Axis:   90  Text Interpretation: Sinus tachycardia Ventricular premature complex Aberrant conduction of SV complex(es) Biatrial enlargement Borderline right axis deviation ST elevation, consider anterolateral injury Since last tracing rate faster Confirmed by Dean Clarity [phone]) on 11/04/2024 4:12:50 PM  Radiology: [facility] Chest Port 1 View Result Date: 11/04/2024 CLINICAL DATA:  Short of breath. EXAM: PORTABLE CHEST 1 VIEW COMPARISON:  01/15/2024. FINDINGS: Cardiac silhouette is normal size.  No mediastinal or hilar masses. Lungs are hyperexpanded with flattened hemidiaphragms. Mild interstitial prominence mostly at the lung bases. No evidence of pneumonia or pulmonary edema. No pleural effusion or pneumothorax. Surgical vascular clips overlying both breast consistent with IMPRESSION: No active disease. Electronically Signed   By: Alm Parkins M.D.   On: 11/04/2024 16:29     Procedures   Medications Ordered in the ED  cefTRIAXone (ROCEPHIN) 1 g in sodium chloride  0.9 % 100 mL IVPB (has no administration in time range)  azithromycin (ZITHROMAX) 500 mg in sodium chloride  0.9 % 250 mL IVPB (has no administration in time range)  sodium chloride  0.9 % bolus 500 mL (has no administration in time range)   ipratropium-albuterol  (DUONEB) 0.5-2.5 (3) MG/3ML nebulizer solution 3 mL (3 mLs Nebulization Given 11/04/24 1545)  methylPREDNISolone  sodium succinate (SOLU-MEDROL ) 125 mg/2 mL injection 125 mg (125 mg Intravenous Given 11/04/24 1539)                                    Medical Decision Making Amount and/or Complexity of Data Reviewed Labs: ordered. Radiology: ordered.  Risk Prescription drug management. Decision regarding hospitalization.   This patient presents to the ED for concern of sob,  this involves an extensive number of treatment options, and is a complaint that carries with it a high risk of complications and morbidity.  The differential diagnosis includes copd exac, covid/flu/rsv, pna, bronchitis   Co morbidities that complicate the patient evaluation  COPD, HTN, hypothyroidism, breast cancer, neuroendocrine carcinoma of lung, and skin cancer   Additional history obtained:  Additional history obtained from epic chart review External records from outside source obtained and reviewed including daughter   Lab Tests:  I Ordered, and personally interpreted labs.  The pertinent results include:  cbc with wbc elevated at 13.8, bmp with n a low at 127 (138 on 04/23/22 which were the last labs in epic); ddimer 0.61 (age adjusted nl); lactic nl at 0.9; trop nl; covid/flu/rsv neg; BNP sl elevated at 554   Imaging Studies ordered:  I ordered imaging studies including cxr  I independently visualized and interpreted imaging which showed nad I agree with the radiologist interpretation   Cardiac Monitoring:  The patient was maintained on a cardiac monitor.  I personally viewed and interpreted the cardiac monitored which showed an underlying rhythm of: st   Medicines ordered and prescription drug management:  I ordered medication including solumedrol/nebs/bipap/rocephin/zithromax  for sx  Reevaluation of the patient after these medicines showed that the patient  improved I have reviewed the patients home medicines and have made adjustments as needed   Test Considered:  ct   Critical Interventions:  bipap   Consultations Obtained:  I requested consultation with the hospitalist (Dr. Fredirick),  and discussed lab and imaging findings as well as pertinent plan - she will admit   Problem List / ED Course:  COPD exacerbation with acute bronchitis:  pt placed on bipap and sx have improved.  Pt given nebs/solumedrol/abx.    Reevaluation:  After the interventions noted above, I reevaluated the patient and found that they have :improved   Social Determinants of Health:  Lives at home   Dispostion:  After consideration of the diagnostic results and the patients response to treatment, I feel that the patent would benefit from admission.  CRITICAL CARE Performed by: Mliss Boyers   Total critical care time: 30 minutes  Critical care time was exclusive of separately billable procedures and treating other patients.  Critical care was necessary to treat or prevent imminent or life-threatening deterioration.  Critical care was time spent personally by me on the following activities: development of treatment plan with patient and/or surrogate as well as nursing, discussions with consultants, evaluation of patient's response to treatment, examination of patient, obtaining history from patient or surrogate, ordering and performing treatments and interventions, ordering and review of laboratory studies, ordering and review of radiographic studies, pulse oximetry and re-evaluation of patient's condition.        Final diagnoses:  COPD with acute exacerbation (HCC)  Acute respiratory failure with hypoxia (HCC)  Acute bronchitis, unspecified organism    ED Discharge Orders     None          Boyers Mliss, MD 11/04/24 1701

## 2024-11-05 ENCOUNTER — Encounter (HOSPITAL_COMMUNITY): Payer: Self-pay | Admitting: Family Medicine

## 2024-11-05 DIAGNOSIS — J9621 Acute and chronic respiratory failure with hypoxia: Secondary | ICD-10-CM | POA: Diagnosis not present

## 2024-11-05 LAB — CBC
HCT: 39.4 % (ref 36.0–46.0)
Hemoglobin: 12.5 g/dL (ref 12.0–15.0)
MCH: 29.3 pg (ref 26.0–34.0)
MCHC: 31.7 g/dL (ref 30.0–36.0)
MCV: 92.5 fL (ref 80.0–100.0)
Platelets: 294 K/uL (ref 150–400)
RBC: 4.26 MIL/uL (ref 3.87–5.11)
RDW: 12.5 % (ref 11.5–15.5)
WBC: 11.8 K/uL — ABNORMAL HIGH (ref 4.0–10.5)
nRBC: 0 % (ref 0.0–0.2)

## 2024-11-05 LAB — COMPREHENSIVE METABOLIC PANEL WITH GFR
ALT: 16 U/L (ref 0–44)
AST: 30 U/L (ref 15–41)
Albumin: 3.5 g/dL (ref 3.5–5.0)
Alkaline Phosphatase: 97 U/L (ref 38–126)
Anion gap: 8 (ref 5–15)
BUN: 12 mg/dL (ref 8–23)
CO2: 29 mmol/L (ref 22–32)
Calcium: 8.8 mg/dL — ABNORMAL LOW (ref 8.9–10.3)
Chloride: 94 mmol/L — ABNORMAL LOW (ref 98–111)
Creatinine, Ser: 0.59 mg/dL (ref 0.44–1.00)
GFR, Estimated: >60 mL/min (ref >60)
Glucose, Bld: 131 mg/dL — ABNORMAL HIGH (ref 70–99)
Potassium: 4.7 mmol/L (ref 3.5–5.1)
Sodium: 131 mmol/L — ABNORMAL LOW (ref 135–145)
Total Bilirubin: 0.3 mg/dL (ref 0.0–1.2)
Total Protein: 6.3 g/dL — ABNORMAL LOW (ref 6.5–8.1)

## 2024-11-05 MED ORDER — CARMEX CLASSIC LIP BALM EX OINT
TOPICAL_OINTMENT | CUTANEOUS | Status: DC | PRN
  Filled 2024-11-05: qty 10

## 2024-11-05 MED ORDER — ORAL CARE MOUTH RINSE: 15.0000 mL | OROMUCOSAL | Status: DC | PRN

## 2024-11-05 MED ORDER — SODIUM CHLORIDE 0.9 % IV SOLN
500.0000 mg | INTRAVENOUS | Status: DC
  Administered 2024-11-05 – 2024-11-06 (×2): 500 mg via INTRAVENOUS
  Filled 2024-11-05 (×3): qty 5

## 2024-11-05 MED ORDER — IPRATROPIUM-ALBUTEROL 0.5-2.5 (3) MG/3ML IN SOLN
3.0000 mL | Freq: Three times a day (TID) | RESPIRATORY_TRACT | Status: DC
  Administered 2024-11-05 – 2024-11-08 (×10): 3 mL via RESPIRATORY_TRACT
  Filled 2024-11-05 (×10): qty 3

## 2024-11-05 MED ORDER — GUAIFENESIN-DM 100-10 MG/5ML PO SYRP
5.0000 mL | ORAL_SOLUTION | ORAL | Status: DC | PRN
  Administered 2024-11-06 – 2024-11-07 (×2): 5 mL via ORAL
  Filled 2024-11-05 (×2): qty 10

## 2024-11-05 MED ORDER — ALBUTEROL SULFATE (2.5 MG/3ML) 0.083% IN NEBU: 2.5000 mg | INHALATION_SOLUTION | RESPIRATORY_TRACT | Status: DC | PRN

## 2024-11-05 NOTE — Progress Notes (Signed)
 ASSUMED CARE OF PT FROM OFF GOING RN. nO CHANGES INITIAL AM ASSESSMENT AT THIS TIME. PT SITTING ON THE SIDE OF BED ENJOYING HER LUNCH WITH HER FAMILY PRESENT. CONDITION STABLE NO S/SX RELATED TO RESP COMPROMISE NOTED AT THE TIME OF THIS REASSESSMENT

## 2024-11-05 NOTE — Progress Notes (Signed)
   11/05/24 0100  BiPAP/CPAP/SIPAP  BiPAP/CPAP/SIPAP Pt Type Adult  BiPAP/CPAP/SIPAP V60  Mask Type Full face mask  Dentures removed? Not applicable  Mask Size Medium  Set Rate 14 breaths/min  Respiratory Rate 24 breaths/min  IPAP 14 cmH20  EPAP 6 cmH2O  FiO2 (%) 24 %  Minute Ventilation 8.9  Leak 5  Peak Inspiratory Pressure (PIP) 15  Tidal Volume (Vt) 372  Patient Home Machine No  Patient Home Mask No  Patient Home Tubing No  Press High Alarm 25 cmH2O  Press Low Alarm 5 cmH2O  Device Plugged into [employer] Power Outlet Yes

## 2024-11-05 NOTE — Progress Notes (Signed)
 PROGRESS NOTE    Victoria Rice  FMW:[id number] DOB: 08/02/1931 DOA: 11/04/2024 PCP: Aisha Harvey, MD   Brief Narrative:  89 y.o. female with medical history significant of COPD, HTN, hypothyroidism, breast cancer, neuroendocrine carcinoma of the lung, skin cancer presented with worsening shortness of breath.  On presentation, saturations were in the 60s needing BiPAP.  D-dimer of 0.61, normal troponins, WBC of 13.8, proBNP of 554.  Chest x-ray showed no evidence of pneumonia or pulmonary edema.  She was started on IV Solu-Medrol .  Assessment & Plan:   Acute respiratory failure with hypoxia COPD exacerbation - Presented with worsening shortness of breath and saturations of 60s needing BiPAP.  Currently on 2.5 L oxygen via nasal cannula.  Wean off as able.  Chest x-ray as above.  RSV/influenza/COVID PCR negative -Continue Solu-Medrol .  Continue nebs and current inhaled regimen. DC Rocephin.  Start Zithromax.  Hypothyroidism--continue levothyroxine   Hypertension - Continue enalapril  Leukocytosis - Improving.  Monitor  Hyponatremia - Improving.  Monitor  DVT prophylaxis: Lovenox  Code Status: DNR.  Confirmed with the patient. Family Communication: None at bedside Disposition Plan: Status is: Inpatient Remains inpatient appropriate because: Of severity of illness    Consultants: None  Procedures: None  Antimicrobials: Rocephin from 11/04/2024 onwards   Subjective: Patient seen and examined at bedside.  Feels like better but still short of breath with exertion.  No fever or vomiting reported.  Objective: Vitals:   11/04/24 2257 11/04/24 2344 11/05/24 0341 11/05/24 0637  BP:  (!) 146/73 136/68 126/65  Pulse:  94 83 85  Resp:  (!) 23 20 18   Temp:  98.4 F (36.9 C) 98 F (36.7 C) 98.5 F (36.9 C)  TempSrc:  Oral    SpO2:  98% 96% 98%  Weight:      Height: 5' (1.524 m)       Intake/Output Summary (Last 24 hours) at 11/05/2024 0749 Last data filed at  11/04/2024 2314 Gross per 24 hour  Intake 357.17 ml  Output 300 ml  Net 57.17 ml   Filed Weights   11/04/24 1850  Weight: 48.4 kg    Examination:  General exam: Appears calm and comfortable.  Elderly female lying in bed. Respiratory system: Bilateral decreased breath sounds at bases with intermittent tachypnea and scattered crackles Cardiovascular system: S1 & S2 heard, Rate controlled Gastrointestinal system: Abdomen is nondistended, soft and nontender. Normal bowel sounds heard. Extremities: No cyanosis, clubbing, edema  Central nervous system: Alert and oriented. No focal neurological deficits. Moving extremities Skin: No rashes, lesions or ulcers Psychiatry: Mostly flat affect.  Not agitated   Data Reviewed: I have personally reviewed following labs and imaging studies  CBC: Recent Labs  Lab 11/04/24 1537 11/05/24 0429  WBC 13.8* 11.8*  NEUTROABS 10.8*  --   HGB 14.3 12.5  HCT 44.5 39.4  MCV 92.1 92.5  PLT 297 294   Basic Metabolic Panel: Recent Labs  Lab 11/04/24 1537 11/05/24 0429  NA 127* 131*  K 4.4 4.7  CL 89* 94*  CO2 28 29  GLUCOSE 139* 131*  BUN 13 12  CREATININE 0.66 0.59  CALCIUM  9.4 8.8*   GFR: Estimated Creatinine Clearance: 32.9 mL/min (by C-G formula based on SCr of 0.59 mg/dL). Liver Function Tests: Recent Labs  Lab 11/05/24 0429  AST 30  ALT 16  ALKPHOS 97  BILITOT 0.3  PROT 6.3*  ALBUMIN 3.5   No results for input(s): LIPASE, AMYLASE in the last 168 hours. No results for input(s): AMMONIA  in the last 168 hours. Coagulation Profile: No results for input(s): INR, PROTIME in the last 168 hours. Cardiac Enzymes: No results for input(s): CKTOTAL, CKMB, CKMBINDEX, TROPONINI in the last 168 hours. BNP (last 3 results) Recent Labs    11/04/24 1537  PROBNP 554.0*   HbA1C: No results for input(s): HGBA1C in the last 72 hours. CBG: No results for input(s): GLUCAP in the last 168 hours. Lipid Profile: No  results for input(s): CHOL, HDL, LDLCALC, TRIG, CHOLHDL, LDLDIRECT in the last 72 hours. Thyroid  Function Tests: Recent Labs    11/04/24 2122  TSH 0.454   Anemia Panel: No results for input(s): VITAMINB12, FOLATE, FERRITIN, TIBC, IRON, RETICCTPCT in the last 72 hours. Sepsis Labs: Recent Labs  Lab 11/04/24 1551  LATICACIDVEN 0.9    Recent Results (from the past 240 hours)  Culture, blood (routine x 2)     Status: None (Preliminary result)   Collection Time: 11/04/24  3:25 PM   Specimen: BLOOD RIGHT FOREARM  Result Value Ref Range Status   Specimen Description   Final    BLOOD RIGHT FOREARM Performed at [facility], [address], [place], [place] [address]    Special Requests   Final    BOTTLES DRAWN AEROBIC AND ANAEROBIC Blood Culture results may not be optimal due to an inadequate volume of blood received in culture bottles Performed at [facility], [address] [place], [place] [address]    Culture PENDING  Incomplete   Report Status PENDING  Incomplete  Resp panel by RT-PCR (RSV, Flu A&B, Covid) Anterior Nasal Swab     Status: None   Collection Time: 11/04/24  3:33 PM   Specimen: Anterior Nasal Swab  Result Value Ref Range Status   SARS Coronavirus 2 by RT PCR NEGATIVE NEGATIVE Final    Comment: (NOTE) SARS-CoV-2 target nucleic acids are NOT DETECTED.  The SARS-CoV-2 RNA is generally detectable in upper respiratory specimens during the acute phase of infection. The lowest concentration of SARS-CoV-2 viral copies this assay can detect is 138 copies/mL. A negative result does not preclude SARS-Cov-2 infection and should not be used as the sole basis for treatment or other patient management decisions. A negative result may occur with  improper specimen collection/handling, submission of specimen other than nasopharyngeal swab, presence of viral mutation(s) within the areas targeted by this assay, and inadequate  number of viral copies(<138 copies/mL). A negative result must be combined with clinical observations, patient history, and epidemiological information. The expected result is Negative.  Fact Sheet for Patients:  bloggercourse.com  Fact Sheet for Healthcare Providers:  seriousbroker.it  This test is no t yet approved or cleared by the [place]  FDA and  has been authorized for detection and/or diagnosis of SARS-CoV-2 by FDA under an Emergency Use Authorization (EUA). This EUA will remain  in effect (meaning this test can be used) for the duration of the COVID-19 declaration under Section 564(b)(1) of the Act, 21 U.S.C.section 360bbb-3(b)(1), unless the authorization is terminated  or revoked sooner.       Influenza A by PCR NEGATIVE NEGATIVE Final   Influenza B by PCR NEGATIVE NEGATIVE Final    Comment: (NOTE) The Xpert Xpress SARS-CoV-2/FLU/RSV plus assay is intended as an aid in the diagnosis of influenza from Nasopharyngeal swab specimens and should not be used as a sole basis for treatment. Nasal washings and aspirates are unacceptable for Xpert Xpress SARS-CoV-2/FLU/RSV testing.  Fact Sheet for Patients: bloggercourse.com  Fact Sheet for  Healthcare Providers: seriousbroker.it  This test is not yet approved or cleared by the [place]  FDA and has been authorized for detection and/or diagnosis of SARS-CoV-2 by FDA under an Emergency Use Authorization (EUA). This EUA will remain in effect (meaning this test can be used) for the duration of the COVID-19 declaration under Section 564(b)(1) of the Act, 21 U.S.C. section 360bbb-3(b)(1), unless the authorization is terminated or revoked.     Resp Syncytial Virus by PCR NEGATIVE NEGATIVE Final    Comment: (NOTE) Fact Sheet for Patients: bloggercourse.com  Fact Sheet for Healthcare  Providers: seriousbroker.it  This test is not yet approved or cleared by the [place]  FDA and has been authorized for detection and/or diagnosis of SARS-CoV-2 by FDA under an Emergency Use Authorization (EUA). This EUA will remain in effect (meaning this test can be used) for the duration of the COVID-19 declaration under Section 564(b)(1) of the Act, 21 U.S.C. section 360bbb-3(b)(1), unless the authorization is terminated or revoked.  Performed at [facility], [address], [place], [place] [address]          Radiology Studies: [facility] Chest Port 1 View Result Date: 11/04/2024 CLINICAL DATA:  Short of breath. EXAM: PORTABLE CHEST 1 VIEW COMPARISON:  01/15/2024. FINDINGS: Cardiac silhouette is normal size.  No mediastinal or hilar masses. Lungs are hyperexpanded with flattened hemidiaphragms. Mild interstitial prominence mostly at the lung bases. No evidence of pneumonia or pulmonary edema. No pleural effusion or pneumothorax. Surgical vascular clips overlying both breast consistent with IMPRESSION: No active disease. Electronically Signed   By: Alm Parkins M.D.   On: 11/04/2024 16:29        Scheduled Meds:  budesonide -glycopyrrolate -formoterol  2 puff Inhalation BID   cholecalciferol  1,000 Units Oral Q lunch   enalapril  2.5 mg Oral Daily   enoxaparin  (LOVENOX ) injection  30 mg Subcutaneous Q24H   ipratropium-albuterol   3 mL Nebulization TID   levothyroxine   50 mcg Oral Q0600   methylPREDNISolone  (SOLU-MEDROL ) injection  125 mg Intravenous BID   Followed by   NOREEN ON 11/06/2024] predniSONE   40 mg Oral Q breakfast   sodium chloride  flush  3 mL Intravenous Q12H   Continuous Infusions:  sodium chloride      cefTRIAXone (ROCEPHIN)  IV            Sophie Mao, MD [facility] 11/05/2024, 7:49 AM '

## 2024-11-06 DIAGNOSIS — J9621 Acute and chronic respiratory failure with hypoxia: Secondary | ICD-10-CM | POA: Diagnosis not present

## 2024-11-06 LAB — CBC WITH DIFFERENTIAL/PLATELET
Abs Immature Granulocytes: 0.11 K/uL — ABNORMAL HIGH (ref 0.00–0.07)
Basophils Absolute: 0 K/uL (ref 0.0–0.1)
Basophils Relative: 0 %
Eosinophils Absolute: 0 K/uL (ref 0.0–0.5)
Eosinophils Relative: 0 %
HCT: 40.2 % (ref 36.0–46.0)
Hemoglobin: 12.7 g/dL (ref 12.0–15.0)
Immature Granulocytes: 1 %
Lymphocytes Relative: 5 %
Lymphs Abs: 0.7 K/uL (ref 0.7–4.0)
MCH: 29.5 pg (ref 26.0–34.0)
MCHC: 31.6 g/dL (ref 30.0–36.0)
MCV: 93.3 fL (ref 80.0–100.0)
Monocytes Absolute: 0.9 K/uL (ref 0.1–1.0)
Monocytes Relative: 7 %
Neutro Abs: 12 K/uL — ABNORMAL HIGH (ref 1.7–7.7)
Neutrophils Relative %: 87 %
Platelets: 332 K/uL (ref 150–400)
RBC: 4.31 MIL/uL (ref 3.87–5.11)
RDW: 12.4 % (ref 11.5–15.5)
WBC: 13.7 K/uL — ABNORMAL HIGH (ref 4.0–10.5)
nRBC: 0 % (ref 0.0–0.2)

## 2024-11-06 LAB — BASIC METABOLIC PANEL WITH GFR
Anion gap: 6 (ref 5–15)
BUN: 13 mg/dL (ref 8–23)
CO2: 32 mmol/L (ref 22–32)
Calcium: 8.9 mg/dL (ref 8.9–10.3)
Chloride: 93 mmol/L — ABNORMAL LOW (ref 98–111)
Creatinine, Ser: 0.63 mg/dL (ref 0.44–1.00)
GFR, Estimated: >60 mL/min (ref >60)
Glucose, Bld: 152 mg/dL — ABNORMAL HIGH (ref 70–99)
Potassium: 4.9 mmol/L (ref 3.5–5.1)
Sodium: 130 mmol/L — ABNORMAL LOW (ref 135–145)

## 2024-11-06 LAB — MAGNESIUM: Magnesium: 2.2 mg/dL (ref 1.7–2.4)

## 2024-11-06 MED ORDER — ALUM & MAG HYDROXIDE-SIMETH 200-200-20 MG/5ML PO SUSP: 15.0000 mL | Freq: Four times a day (QID) | ORAL | Status: DC | PRN

## 2024-11-06 MED ORDER — METHYLPREDNISOLONE SODIUM SUCC 125 MG IJ SOLR
80.0000 mg | Freq: Two times a day (BID) | INTRAMUSCULAR | Status: DC
  Administered 2024-11-06 – 2024-11-07 (×4): 80 mg via INTRAVENOUS
  Filled 2024-11-06 (×4): qty 2

## 2024-11-06 NOTE — Evaluation (Addendum)
 Physical Therapy Evaluation Patient Details Name: Victoria Rice MRN: [id number] DOB: 08-Jun-1931 Today's Date: 11/06/2024    SATURATION QUALIFICATIONS: (This note is used to comply with regulatory documentation for home oxygen)  Patient Saturations on Room Air at Rest = 87%  Patient Saturations on [employer] with activity =n/a  Patient Saturations on 3 Liters of oxygen while Ambulating = 92%    History of Present Illness  89 yo female admitted with COPD exac. Hx of COPD, breast Ca, skin Ca, neuroendocrine Ca  Clinical Impression  On eval, pt was CGA for mobility. She ambulated ~150 feet in the hallway. She tolerated activity well. Reports some lower chest/upper abdomen discomfort which she thinks is indigestion-RN aware per pt. Encouraged pt to sit up as tolerated. Encouraged her to use her breathing exercises devices as tolerated. Will recommend HHPT f/u if pt is agreeable. Recommend daily ambulation with nursing and/or mobility team supervision with SpO2 monitoring.        If plan is discharge home, recommend the following: A little help with walking and/or transfers;A little help with bathing/dressing/bathroom;Assistance with cooking/housework;Assist for transportation;Help with stairs or ramp for entrance   Can travel by private vehicle        Equipment Recommendations None recommended by PT  Recommendations for Other Services       Functional Status Assessment Patient has had a recent decline in their functional status and demonstrates the ability to make significant improvements in function in a reasonable and predictable amount of time.     Precautions / Restrictions Precautions Precautions: Fall Precaution/Restrictions Comments: monitor O2 Restrictions Weight Bearing Restrictions Per Provider Order: No      Mobility  Bed Mobility Overal bed mobility: Needs Assistance Bed Mobility: Supine to Sit     Supine to sit: Min assist     General bed mobility  comments: Handheld assist provided to pt to get fully trunk fully upright-pt trying to avoid pushing through much thru L elbow. Increased time.    Transfers Overall transfer level: Needs assistance Equipment used: None Transfers: Sit to/from Stand Sit to Stand: Contact guard assist                Ambulation/Gait Ambulation/Gait assistance: Contact guard assist Gait Distance (Feet): 150 Feet Assistive device: None Gait Pattern/deviations: Step-through pattern, Decreased stride length       General Gait Details: Slow gait speed. Intermitte. nt unsteadiness but no overt LOB. O2: 93% on 3L (walked on 4L initially but was able to drop down to 3L). Once returned to room, placed back on wall O2 4L.   Stairs            Wheelchair Mobility     Tilt Bed    Modified Rankin (Stroke Patients Only)       Balance Overall balance assessment: Mild deficits observed, not formally tested                                           Pertinent Vitals/Pain Pain Assessment Pain Assessment: No/denies pain    Home Living   Living Arrangements: Alone Available Help at Discharge: Family;Available PRN/intermittently Type of Home: Independent living facility Home Access: Level entry       Home Layout: One level Home Equipment: [employer] (2 wheels)      Prior Function Prior Level of Function : Independent/Modified Independent  Mobility Comments: ind ADLs Comments: ind     Extremity/Trunk Assessment   Upper Extremity Assessment Upper Extremity Assessment: LUE deficits/detail LUE Deficits / Details: L elbow fx-non op management-still some occasional pain and instructed not to push/pull too much    Lower Extremity Assessment Lower Extremity Assessment: Generalized weakness    Cervical / Trunk Assessment Cervical / Trunk Assessment: Kyphotic  Communication   Communication Communication: No apparent difficulties    Cognition  Arousal: Alert Behavior During Therapy: WFL for tasks assessed/performed   PT - Cognitive impairments: No apparent impairments                         Following commands: Intact       Cueing Cueing Techniques: Verbal cues     General Comments      Exercises     Assessment/Plan    PT Assessment Patient needs continued PT services  PT Problem List Decreased strength;Decreased range of motion;Decreased balance;Decreased activity tolerance;Decreased mobility       PT Treatment Interventions DME instruction;Gait training;Functional mobility training;Therapeutic activities;Therapeutic exercise;Patient/family education;Balance training    PT Goals (Current goals can be found in the Care Plan section)  Acute Rehab PT Goals Patient Stated Goal: get better and return home PT Goal Formulation: With patient Time For Goal Achievement: 11/20/24 Potential to Achieve Goals: Good    Frequency Min 3X/week     Co-evaluation               AM-PAC PT 6 Clicks Mobility  Outcome Measure Help needed turning from your back to your side while in a flat bed without using bedrails?: None Help needed moving from lying on your back to sitting on the side of a flat bed without using bedrails?: None Help needed moving to and from a bed to a chair (including a wheelchair)?: None Help needed standing up from a chair using your arms (e.g., wheelchair or bedside chair)?: None Help needed to walk in hospital room?: A Little Help needed climbing 3-5 steps with a railing? : A Little 6 Click Score: 22    End of Session Equipment Utilized During Treatment: Gait belt;Oxygen Activity Tolerance: Patient tolerated treatment well Patient left: in chair;with call bell/phone within reach   PT Visit Diagnosis: Difficulty in walking, not elsewhere classified (R26.2);Unsteadiness on feet (R26.81)    Time: 8946-8883 PT Time Calculation (min) (ACUTE ONLY): 23 min   Charges:   PT  Evaluation $PT Eval Low Complexity: 1 Low PT Treatments $Gait Training: 8-22 mins PT General Charges $$ ACUTE PT VISIT: 1 Visit           Dannial SQUIBB, PT Acute Rehabilitation  Office: [phone]

## 2024-11-06 NOTE — Progress Notes (Signed)
 Please PROGRESS NOTE    Victoria Rice  FMW:[id number] DOB: 1931-04-24 DOA: 11/04/2024 PCP: Aisha Harvey, MD   Brief Narrative:  89 y.o. female with medical history significant of COPD, HTN, hypothyroidism, breast cancer, neuroendocrine carcinoma of the lung, skin cancer presented with worsening shortness of breath.  On presentation, saturations were in the 60s needing BiPAP.  D-dimer of 0.61, normal troponins, WBC of 13.8, proBNP of 554.  Chest x-ray showed no evidence of pneumonia or pulmonary edema.  She was started on IV Solu-Medrol .  Assessment & Plan:   Acute respiratory failure with hypoxia COPD exacerbation - Presented with worsening shortness of breath and saturations of 60s needing BiPAP.  Currently on 4 L oxygen via nasal cannula.  Wean off as able.  Chest x-ray as above.  RSV/influenza/COVID PCR negative -Continue Solu-Medrol .  Continue nebs and current inhaled regimen. Continue Zithromax.  Hypothyroidism--continue levothyroxine   Hypertension - Continue enalapril  Leukocytosis - Monitor intermittently.  Possibly reactive.  Hyponatremia - Mild.  Monitor intermittently.  Encourage oral intake.  Physical deconditioning - PT eval  DVT prophylaxis: Lovenox  Code Status: DNR.   Family Communication: None at bedside Disposition Plan: Status is: Inpatient Remains inpatient appropriate because: Of severity of illness    Consultants: None  Procedures: None  Antimicrobials: Rocephin from 11/04/2024 onwards   Subjective: Patient seen and examined at bedside.  No chest pain, fever or abdominal pain reported.  Still short of breath with some exertion.  Does not feel ready to go home today. Objective: Vitals:   11/06/24 0000 11/06/24 0308 11/06/24 0438 11/06/24 0440  BP:   129/78   Pulse:   92   Resp: 17 19 20    Temp:   97.8 F (36.6 C)   TempSrc:   Oral   SpO2:   96%   Weight:    48.7 kg  Height:        Intake/Output Summary (Last 24 hours) at  11/06/2024 0745 Last data filed at 11/05/2024 2049 Gross per 24 hour  Intake 240 ml  Output 250 ml  Net -10 ml   Filed Weights   11/04/24 1850 11/06/24 0440  Weight: 48.4 kg 48.7 kg    Examination:  General: On 4 L oxygen via nasal cannula.  No distress ENT/neck: No thyromegaly.  JVD is not elevated  respiratory: Decreased breath sounds at bases bilaterally with some crackles CVS: S1-S2 heard, rate controlled currently Abdominal: Soft, nontender, slightly distended; no organomegaly, bowel sounds are heard Extremities: Trace lower extremity edema; no cyanosis  CNS: Awake and alert.  No focal neurologic deficit.  Moves extremities Lymph: No obvious lymphadenopathy Skin: No obvious ecchymosis/lesions  psych: Flat affect; currently not agitated  musculoskeletal: No obvious joint swelling/deformity    Data Reviewed: I have personally reviewed following labs and imaging studies  CBC: Recent Labs  Lab 11/04/24 1537 11/05/24 0429 11/06/24 0456  WBC 13.8* 11.8* 13.7*  NEUTROABS 10.8*  --  12.0*  HGB 14.3 12.5 12.7  HCT 44.5 39.4 40.2  MCV 92.1 92.5 93.3  PLT 297 294 332   Basic Metabolic Panel: Recent Labs  Lab 11/04/24 1537 11/05/24 0429 11/06/24 0456  NA 127* 131* 130*  K 4.4 4.7 4.9  CL 89* 94* 93*  CO2 28 29 32  GLUCOSE 139* 131* 152*  BUN 13 12 13   CREATININE 0.66 0.59 0.63  CALCIUM  9.4 8.8* 8.9  MG  --   --  2.2   GFR: Estimated Creatinine Clearance: 32.9 mL/min (by C-G formula based  on SCr of 0.63 mg/dL). Liver Function Tests: Recent Labs  Lab 11/05/24 0429  AST 30  ALT 16  ALKPHOS 97  BILITOT 0.3  PROT 6.3*  ALBUMIN 3.5   No results for input(s): LIPASE, AMYLASE in the last 168 hours. No results for input(s): AMMONIA in the last 168 hours. Coagulation Profile: No results for input(s): INR, PROTIME in the last 168 hours. Cardiac Enzymes: No results for input(s): CKTOTAL, CKMB, CKMBINDEX, TROPONINI in the last 168 hours. BNP  (last 3 results) Recent Labs    11/04/24 1537  PROBNP 554.0*   HbA1C: No results for input(s): HGBA1C in the last 72 hours. CBG: No results for input(s): GLUCAP in the last 168 hours. Lipid Profile: No results for input(s): CHOL, HDL, LDLCALC, TRIG, CHOLHDL, LDLDIRECT in the last 72 hours. Thyroid  Function Tests: Recent Labs    11/04/24 2122  TSH 0.454   Anemia Panel: No results for input(s): VITAMINB12, FOLATE, FERRITIN, TIBC, IRON, RETICCTPCT in the last 72 hours. Sepsis Labs: Recent Labs  Lab 11/04/24 1551  LATICACIDVEN 0.9    Recent Results (from the past 240 hours)  Culture, blood (routine x 2)     Status: None (Preliminary result)   Collection Time: 11/04/24  3:25 PM   Specimen: BLOOD RIGHT FOREARM  Result Value Ref Range Status   Specimen Description   Final    BLOOD RIGHT FOREARM Performed at [facility], [address], [place], [place] [address]    Special Requests   Final    BOTTLES DRAWN AEROBIC AND ANAEROBIC Blood Culture results may not be optimal due to an inadequate volume of blood received in culture bottles Performed at [facility], [address] [place], [place] [address]    Culture   Final    NO GROWTH < 24 HOURS Performed at [facility], [address], [place], [place] [address]    Report Status PENDING  Incomplete  Culture, blood (routine x 2)     Status: None (Preliminary result)   Collection Time: 11/04/24  3:30 PM   Specimen: BLOOD  Result Value Ref Range Status   Specimen Description   Final    BLOOD RIGHT ANTECUBITAL Performed at [facility], [address], [place], [place] [address]    Special Requests   Final    BOTTLES DRAWN AEROBIC AND ANAEROBIC Blood Culture adequate volume Performed at [facility], [address] [place], [place] [address]    Culture   Final    NO GROWTH < 24 HOURS Performed at [facility],  [address] [place], [place] [address]    Report Status PENDING  Incomplete  Resp panel by RT-PCR (RSV, Flu A&B, Covid) Anterior Nasal Swab     Status: None   Collection Time: 11/04/24  3:33 PM   Specimen: Anterior Nasal Swab  Result Value Ref Range Status   SARS Coronavirus 2 by RT PCR NEGATIVE NEGATIVE Final    Comment: (NOTE) SARS-CoV-2 target nucleic acids are NOT DETECTED.  The SARS-CoV-2 RNA is generally detectable in upper respiratory specimens during the acute phase of infection. The lowest concentration of SARS-CoV-2 viral copies this assay can detect is 138 copies/mL. A negative result does not preclude SARS-Cov-2 infection and should not be used as the sole basis for treatment or other patient management decisions. A negative result may occur with  improper specimen collection/handling, submission of specimen other than nasopharyngeal swab, presence of viral mutation(s)  within the areas targeted by this assay, and inadequate number of viral copies(<138 copies/mL). A negative result must be combined with clinical observations, patient history, and epidemiological information. The expected result is Negative.  Fact Sheet for Patients:  bloggercourse.com  Fact Sheet for Healthcare Providers:  seriousbroker.it  This test is no t yet approved or cleared by the [place]  FDA and  has been authorized for detection and/or diagnosis of SARS-CoV-2 by FDA under an Emergency Use Authorization (EUA). This EUA will remain  in effect (meaning this test can be used) for the duration of the COVID-19 declaration under Section 564(b)(1) of the Act, 21 U.S.C.section 360bbb-3(b)(1), unless the authorization is terminated  or revoked sooner.       Influenza A by PCR NEGATIVE NEGATIVE Final   Influenza B by PCR NEGATIVE NEGATIVE Final    Comment: (NOTE) The Xpert Xpress SARS-CoV-2/FLU/RSV plus assay is intended as an aid in the  diagnosis of influenza from Nasopharyngeal swab specimens and should not be used as a sole basis for treatment. Nasal washings and aspirates are unacceptable for Xpert Xpress SARS-CoV-2/FLU/RSV testing.  Fact Sheet for Patients: bloggercourse.com  Fact Sheet for Healthcare Providers: seriousbroker.it  This test is not yet approved or cleared by the [place]  FDA and has been authorized for detection and/or diagnosis of SARS-CoV-2 by FDA under an Emergency Use Authorization (EUA). This EUA will remain in effect (meaning this test can be used) for the duration of the COVID-19 declaration under Section 564(b)(1) of the Act, 21 U.S.C. section 360bbb-3(b)(1), unless the authorization is terminated or revoked.     Resp Syncytial Virus by PCR NEGATIVE NEGATIVE Final    Comment: (NOTE) Fact Sheet for Patients: bloggercourse.com  Fact Sheet for Healthcare Providers: seriousbroker.it  This test is not yet approved or cleared by the [place]  FDA and has been authorized for detection and/or diagnosis of SARS-CoV-2 by FDA under an Emergency Use Authorization (EUA). This EUA will remain in effect (meaning this test can be used) for the duration of the COVID-19 declaration under Section 564(b)(1) of the Act, 21 U.S.C. section 360bbb-3(b)(1), unless the authorization is terminated or revoked.  Performed at [facility], [address], [place], [place] [address]          Radiology Studies: [facility] Chest Port 1 View Result Date: 11/04/2024 CLINICAL DATA:  Short of breath. EXAM: PORTABLE CHEST 1 VIEW COMPARISON:  01/15/2024. FINDINGS: Cardiac silhouette is normal size.  No mediastinal or hilar masses. Lungs are hyperexpanded with flattened hemidiaphragms. Mild interstitial prominence mostly at the lung bases. No evidence of pneumonia or pulmonary edema. No pleural  effusion or pneumothorax. Surgical vascular clips overlying both breast consistent with IMPRESSION: No active disease. Electronically Signed   By: Alm Parkins M.D.   On: 11/04/2024 16:29        Scheduled Meds:  budesonide -glycopyrrolate -formoterol  2 puff Inhalation BID   cholecalciferol  1,000 Units Oral Q lunch   enalapril  2.5 mg Oral Daily   enoxaparin  (LOVENOX ) injection  30 mg Subcutaneous Q24H   ipratropium-albuterol   3 mL Nebulization TID   levothyroxine   50 mcg Oral Q0600   sodium chloride  flush  3 mL Intravenous Q12H   Continuous Infusions:  azithromycin (ZITHROMAX) 500 mg in sodium chloride  0.9 % 250 mL IVPB 500 mg (11/05/24 1639)          Sophie Mao, MD [facility] 11/06/2024, 7:45 AM '

## 2024-11-06 NOTE — Progress Notes (Signed)
   11/06/24 0000  BiPAP/CPAP/SIPAP  $ Non-Invasive Ventilator  Non-Invasive Vent Subsequent  BiPAP/CPAP/SIPAP Pt Type Adult  BiPAP/CPAP/SIPAP V60  Mask Type Full face mask  Dentures removed? Not applicable  Mask Size Medium  Set Rate 14 breaths/min  Respiratory Rate 28 breaths/min  IPAP 14 cmH20  EPAP 6 cmH2O  FiO2 (%) 40 %  Flow Rate 0 lpm  Minute Ventilation 9.7  Leak 5  Peak Inspiratory Pressure (PIP) 15  Tidal Volume (Vt) 392  Patient Home Machine No  Patient Home Mask No  Patient Home Tubing No  Auto Titrate No  Press High Alarm 25 cmH2O  Press Low Alarm 5 cmH2O  Device Plugged into RED Power Outlet Yes  Oxygen Percent 40 %  BiPAP/CPAP /SiPAP Vitals  Resp 17  MEWS Score/Color  MEWS Score 0  MEWS Score Color Green

## 2024-11-06 NOTE — Plan of Care (Signed)
  Problem: Education: Goal: Knowledge of General Education information will improve Description: Including pain rating scale, medication(s)/side effects and non-pharmacologic comfort measures Outcome: Progressing   Problem: Health Behavior/Discharge Planning: Goal: Ability to manage health-related needs will improve Outcome: Progressing   Problem: Clinical Measurements: Goal: Ability to maintain clinical measurements within normal limits will improve Outcome: Progressing Goal: Respiratory complications will improve Outcome: Progressing Goal: Cardiovascular complication will be avoided Outcome: Progressing   Problem: Activity: Goal: Risk for activity intolerance will decrease Outcome: Progressing   Problem: Nutrition: Goal: Adequate nutrition will be maintained Outcome: Progressing   Problem: Coping: Goal: Level of anxiety will decrease Outcome: Progressing   Problem: Elimination: Goal: Will not experience complications related to bowel motility Outcome: Progressing Goal: Will not experience complications related to urinary retention Outcome: Progressing   Problem: Pain Managment: Goal: General experience of comfort will improve and/or be controlled Outcome: Progressing   Problem: Safety: Goal: Ability to remain free from injury will improve Outcome: Progressing   Problem: Skin Integrity: Goal: Risk for impaired skin integrity will decrease Outcome: Progressing   Problem: Education: Goal: Knowledge of disease or condition will improve Outcome: Progressing Goal: Knowledge of the prescribed therapeutic regimen will improve Outcome: Progressing   Problem: Activity: Goal: Ability to tolerate increased activity will improve Outcome: Progressing   Problem: Respiratory: Goal: Ability to maintain a clear airway will improve Outcome: Progressing Goal: Levels of oxygenation will improve Outcome: Progressing Goal: Ability to maintain adequate ventilation will  improve Outcome: Progressing

## 2024-11-06 NOTE — Progress Notes (Signed)
 Pharmacy notified of grade 2 infiltration, IV removed - area red and tender. Instructed to elevate with heat or ice

## 2024-11-07 ENCOUNTER — Inpatient Hospital Stay (HOSPITAL_COMMUNITY)

## 2024-11-07 ENCOUNTER — Other Ambulatory Visit (HOSPITAL_COMMUNITY): Payer: Self-pay

## 2024-11-07 DIAGNOSIS — I700 Atherosclerosis of aorta: Secondary | ICD-10-CM | POA: Diagnosis not present

## 2024-11-07 DIAGNOSIS — J9621 Acute and chronic respiratory failure with hypoxia: Secondary | ICD-10-CM | POA: Diagnosis not present

## 2024-11-07 DIAGNOSIS — J90 Pleural effusion, not elsewhere classified: Secondary | ICD-10-CM | POA: Diagnosis not present

## 2024-11-07 DIAGNOSIS — R0600 Dyspnea, unspecified: Secondary | ICD-10-CM | POA: Diagnosis not present

## 2024-11-07 LAB — CBC WITH DIFFERENTIAL/PLATELET
Abs Immature Granulocytes: 0.12 K/uL — ABNORMAL HIGH (ref 0.00–0.07)
Basophils Absolute: 0 K/uL (ref 0.0–0.1)
Basophils Relative: 0 %
Eosinophils Absolute: 0 K/uL (ref 0.0–0.5)
Eosinophils Relative: 0 %
HCT: 39.5 % (ref 36.0–46.0)
Hemoglobin: 12.5 g/dL (ref 12.0–15.0)
Immature Granulocytes: 1 %
Lymphocytes Relative: 4 %
Lymphs Abs: 0.6 K/uL — ABNORMAL LOW (ref 0.7–4.0)
MCH: 29.8 pg (ref 26.0–34.0)
MCHC: 31.6 g/dL (ref 30.0–36.0)
MCV: 94.3 fL (ref 80.0–100.0)
Monocytes Absolute: 0.9 K/uL (ref 0.1–1.0)
Monocytes Relative: 6 %
Neutro Abs: 12.3 K/uL — ABNORMAL HIGH (ref 1.7–7.7)
Neutrophils Relative %: 89 %
Platelets: 359 K/uL (ref 150–400)
RBC: 4.19 MIL/uL (ref 3.87–5.11)
RDW: 12.5 % (ref 11.5–15.5)
WBC: 13.9 K/uL — ABNORMAL HIGH (ref 4.0–10.5)
nRBC: 0 % (ref 0.0–0.2)

## 2024-11-07 LAB — BASIC METABOLIC PANEL WITH GFR
Anion gap: 5 (ref 5–15)
BUN: 15 mg/dL (ref 8–23)
CO2: 34 mmol/L — ABNORMAL HIGH (ref 22–32)
Calcium: 8.9 mg/dL (ref 8.9–10.3)
Chloride: 93 mmol/L — ABNORMAL LOW (ref 98–111)
Creatinine, Ser: 0.59 mg/dL (ref 0.44–1.00)
GFR, Estimated: >60 mL/min (ref >60)
Glucose, Bld: 142 mg/dL — ABNORMAL HIGH (ref 70–99)
Potassium: 4.9 mmol/L (ref 3.5–5.1)
Sodium: 132 mmol/L — ABNORMAL LOW (ref 135–145)

## 2024-11-07 LAB — MAGNESIUM: Magnesium: 2.3 mg/dL (ref 1.7–2.4)

## 2024-11-07 MED ORDER — TRAMADOL HCL 50 MG PO TABS
50.0000 mg | ORAL_TABLET | Freq: Two times a day (BID) | ORAL | Status: DC | PRN
  Administered 2024-11-07: 50 mg via ORAL
  Filled 2024-11-07: qty 1

## 2024-11-07 MED ORDER — ALUM & MAG HYDROXIDE-SIMETH 200-200-20 MG/5ML PO SUSP: 15.0000 mL | ORAL | Status: DC | PRN

## 2024-11-07 MED ORDER — ALPRAZOLAM 0.25 MG PO TABS
0.2500 mg | ORAL_TABLET | Freq: Three times a day (TID) | ORAL | Status: DC | PRN
  Administered 2024-11-07: 0.25 mg via ORAL
  Filled 2024-11-07 (×2): qty 1

## 2024-11-07 MED ORDER — AZITHROMYCIN 500 MG PO TABS
500.0000 mg | ORAL_TABLET | Freq: Every day | ORAL | Status: DC
  Administered 2024-11-07 – 2024-11-09 (×3): 500 mg via ORAL
  Filled 2024-11-07: qty 2
  Filled 2024-11-07 (×2): qty 1

## 2024-11-07 MED ORDER — PANTOPRAZOLE SODIUM 40 MG PO TBEC
40.0000 mg | DELAYED_RELEASE_TABLET | Freq: Every day | ORAL | Status: DC
  Administered 2024-11-07 – 2024-11-09 (×3): 40 mg via ORAL
  Filled 2024-11-07 (×3): qty 1

## 2024-11-07 MED ORDER — MENTHOL 3 MG MT LOZG: 1.0000 | LOZENGE | Freq: Four times a day (QID) | OROMUCOSAL | Status: DC | PRN

## 2024-11-07 NOTE — Progress Notes (Signed)
 Please PROGRESS NOTE    Victoria Rice  FMW:[id number] DOB: 1931/09/18 DOA: 11/04/2024 PCP: Aisha Harvey, MD   Brief Narrative:  89 y.o. female with medical history significant of COPD, HTN, hypothyroidism, breast cancer, neuroendocrine carcinoma of the lung, skin cancer presented with worsening shortness of breath.  On presentation, saturations were in the 60s needing BiPAP.  D-dimer of 0.61, normal troponins, WBC of 13.8, proBNP of 554.  Chest x-ray showed no evidence of pneumonia or pulmonary edema.  She was started on IV Solu-Medrol .  Assessment & Plan:   Acute respiratory failure with hypoxia COPD exacerbation - Presented with worsening shortness of breath and saturations of 60s needing BiPAP.  Currently on 2  L oxygen via nasal cannula.  Wean off as able.  Chest x-ray as above.  RSV/influenza/COVID PCR negative -Continue Solu-Medrol .  Continue nebs and current inhaled regimen. Continue Zithromax.  Hypothyroidism--continue levothyroxine   Hypertension - Continue enalapril  Leukocytosis - Monitor intermittently.  Possibly reactive.  Hyponatremia - Mild.  Monitor intermittently.  Encourage oral intake.  Physical deconditioning - PT recommended home health PT  DVT prophylaxis: Lovenox  Code Status: DNR.   Family Communication: None at bedside Disposition Plan: Status is: Inpatient Remains inpatient appropriate because: Of severity of illness    Consultants: None  Procedures: None  Antimicrobials: Zithromax Subjective: Patient seen and examined at bedside.  Continues to have intermittent cough and shortness of breath.  Also complains of acid reflux.  No fever or vomiting reported.  Does not feel ready to go home today.   Objective: Vitals:   11/07/24 0419 11/07/24 0702 11/07/24 0803 11/07/24 0805  BP: (!) 151/86     Pulse: 89     Resp: 16     Temp: 97.8 F (36.6 C)     TempSrc: Oral     SpO2: 97%  97% 97%  Weight:  50 kg    Height:         Intake/Output Summary (Last 24 hours) at 11/07/2024 0851 Last data filed at 11/06/2024 1500 Gross per 24 hour  Intake 370 ml  Output --  Net 370 ml   Filed Weights   11/04/24 1850 11/06/24 0440 11/07/24 0702  Weight: 48.4 kg 48.7 kg 50 kg    Examination:  General: No acute distress.  Currently on 2 L oxygen via nasal cannula ENT/neck: No neck masses or JVD elevation noted  respiratory: Bilateral decreased breath sounds at bases with some wheezing  CVS: Rate mostly controlled; S1 and S2 are heard  abdominal: Soft, nontender, remains distended slightly; no organomegaly, bowel sounds are heard normally Extremities: No clubbing; mild lower extremity edema present  CNS: Alert and oriented.  No focal neurologic deficit.  Able to move extremities Lymph: No obvious palpable lymphadenopathy Skin: No obvious petechiae/rashes  psych: Mostly flat affect; currently not agitated musculoskeletal: No obvious joint tenderness/erythema    Data Reviewed: I have personally reviewed following labs and imaging studies  CBC: Recent Labs  Lab 11/04/24 1537 11/05/24 0429 11/06/24 0456 11/07/24 0414  WBC 13.8* 11.8* 13.7* 13.9*  NEUTROABS 10.8*  --  12.0* 12.3*  HGB 14.3 12.5 12.7 12.5  HCT 44.5 39.4 40.2 39.5  MCV 92.1 92.5 93.3 94.3  PLT 297 294 332 359   Basic Metabolic Panel: Recent Labs  Lab 11/04/24 1537 11/05/24 0429 11/06/24 0456 11/07/24 0414  NA 127* 131* 130* 132*  K 4.4 4.7 4.9 4.9  CL 89* 94* 93* 93*  CO2 28 29 32 34*  GLUCOSE 139* 131*  152* 142*  BUN 13 12 13 15   CREATININE 0.66 0.59 0.63 0.59  CALCIUM  9.4 8.8* 8.9 8.9  MG  --   --  2.2 2.3   GFR: Estimated Creatinine Clearance: 32.9 mL/min (by C-G formula based on SCr of 0.59 mg/dL). Liver Function Tests: Recent Labs  Lab 11/05/24 0429  AST 30  ALT 16  ALKPHOS 97  BILITOT 0.3  PROT 6.3*  ALBUMIN 3.5   No results for input(s): LIPASE, AMYLASE in the last 168 hours. No results for input(s):  AMMONIA in the last 168 hours. Coagulation Profile: No results for input(s): INR, PROTIME in the last 168 hours. Cardiac Enzymes: No results for input(s): CKTOTAL, CKMB, CKMBINDEX, TROPONINI in the last 168 hours. BNP (last 3 results) Recent Labs    11/04/24 1537  PROBNP 554.0*   HbA1C: No results for input(s): HGBA1C in the last 72 hours. CBG: No results for input(s): GLUCAP in the last 168 hours. Lipid Profile: No results for input(s): CHOL, HDL, LDLCALC, TRIG, CHOLHDL, LDLDIRECT in the last 72 hours. Thyroid  Function Tests: Recent Labs    11/04/24 2122  TSH 0.454   Anemia Panel: No results for input(s): VITAMINB12, FOLATE, FERRITIN, TIBC, IRON, RETICCTPCT in the last 72 hours. Sepsis Labs: Recent Labs  Lab 11/04/24 1551  LATICACIDVEN 0.9    Recent Results (from the past 240 hours)  Culture, blood (routine x 2)     Status: None (Preliminary result)   Collection Time: 11/04/24  3:25 PM   Specimen: BLOOD RIGHT FOREARM  Result Value Ref Range Status   Specimen Description   Final    BLOOD RIGHT FOREARM Performed at [facility], [address] [place], [place] [address]    Special Requests   Final    BOTTLES DRAWN AEROBIC AND ANAEROBIC Blood Culture results may not be optimal due to an inadequate volume of blood received in culture bottles Performed at [facility], [address], [place], [place] [address]    Culture   Final    NO GROWTH 2 DAYS Performed at [facility], [address] [place], [place] [address]    Report Status PENDING  Incomplete  Culture, blood (routine x 2)     Status: None (Preliminary result)   Collection Time: 11/04/24  3:30 PM   Specimen: BLOOD  Result Value Ref Range Status   Specimen Description   Final    BLOOD RIGHT ANTECUBITAL Performed at [facility], [address] [place], [place] [address]    Special Requests   Final    BOTTLES  DRAWN AEROBIC AND ANAEROBIC Blood Culture adequate volume Performed at [facility], [address] [place], [place] [address]    Culture   Final    NO GROWTH 2 DAYS Performed at [facility], [address], [place], [place] [address]    Report Status PENDING  Incomplete  Resp panel by RT-PCR (RSV, Flu A&B, Covid) Anterior Nasal Swab     Status: None   Collection Time: 11/04/24  3:33 PM   Specimen: Anterior Nasal Swab  Result Value Ref Range Status   SARS Coronavirus 2 by RT PCR NEGATIVE NEGATIVE Final    Comment: (NOTE) SARS-CoV-2 target nucleic acids are NOT DETECTED.  The SARS-CoV-2 RNA is generally detectable in upper respiratory specimens during the acute phase of infection. The lowest concentration of SARS-CoV-2 viral copies this assay can detect is 138 copies/mL. A negative result does not  preclude SARS-Cov-2 infection and should not be used as the sole basis for treatment or other patient management decisions. A negative result may occur with  improper specimen collection/handling, submission of specimen other than nasopharyngeal swab, presence of viral mutation(s) within the areas targeted by this assay, and inadequate number of viral copies(<138 copies/mL). A negative result must be combined with clinical observations, patient history, and epidemiological information. The expected result is Negative.  Fact Sheet for Patients:  bloggercourse.com  Fact Sheet for Healthcare Providers:  seriousbroker.it  This test is no t yet approved or cleared by the [place]  FDA and  has been authorized for detection and/or diagnosis of SARS-CoV-2 by FDA under an Emergency Use Authorization (EUA). This EUA will remain  in effect (meaning this test can be used) for the duration of the COVID-19 declaration under Section 564(b)(1) of the Act, 21 U.S.C.section 360bbb-3(b)(1), unless the authorization is  terminated  or revoked sooner.       Influenza A by PCR NEGATIVE NEGATIVE Final   Influenza B by PCR NEGATIVE NEGATIVE Final    Comment: (NOTE) The Xpert Xpress SARS-CoV-2/FLU/RSV plus assay is intended as an aid in the diagnosis of influenza from Nasopharyngeal swab specimens and should not be used as a sole basis for treatment. Nasal washings and aspirates are unacceptable for Xpert Xpress SARS-CoV-2/FLU/RSV testing.  Fact Sheet for Patients: bloggercourse.com  Fact Sheet for Healthcare Providers: seriousbroker.it  This test is not yet approved or cleared by the [place]  FDA and has been authorized for detection and/or diagnosis of SARS-CoV-2 by FDA under an Emergency Use Authorization (EUA). This EUA will remain in effect (meaning this test can be used) for the duration of the COVID-19 declaration under Section 564(b)(1) of the Act, 21 U.S.C. section 360bbb-3(b)(1), unless the authorization is terminated or revoked.     Resp Syncytial Virus by PCR NEGATIVE NEGATIVE Final    Comment: (NOTE) Fact Sheet for Patients: bloggercourse.com  Fact Sheet for Healthcare Providers: seriousbroker.it  This test is not yet approved or cleared by the [place]  FDA and has been authorized for detection and/or diagnosis of SARS-CoV-2 by FDA under an Emergency Use Authorization (EUA). This EUA will remain in effect (meaning this test can be used) for the duration of the COVID-19 declaration under Section 564(b)(1) of the Act, 21 U.S.C. section 360bbb-3(b)(1), unless the authorization is terminated or revoked.  Performed at [facility], [address] [place], [place] [address]          Radiology Studies: No results found.       Scheduled Meds:  budesonide -glycopyrrolate -formoterol   2 puff Inhalation BID   cholecalciferol   1,000 Units Oral Q  lunch   enalapril   2.5 mg Oral Daily   enoxaparin  (LOVENOX ) injection  30 mg Subcutaneous Q24H   ipratropium-albuterol   3 mL Nebulization TID   levothyroxine   50 mcg Oral Q0600   methylPREDNISolone  (SOLU-MEDROL ) injection  80 mg Intravenous Q12H   sodium chloride  flush  3 mL Intravenous Q12H   Continuous Infusions:  azithromycin  (ZITHROMAX ) 500 mg in sodium chloride  0.9 % 250 mL IVPB 500 mg (11/06/24 1702)          Sophie Mao, MD [facility] 11/07/2024, 8:51 AM '

## 2024-11-07 NOTE — Progress Notes (Signed)
 Mobility Specialist - Progress Note   11/07/24 0923  Mobility  Activity Ambulated with assistance  Level of Assistance Contact guard assist, steadying assist  Assistive Device Other (Comment) (HHA)  Distance Ambulated (ft) 10 ft  Range of Motion/Exercises Active  Activity Response Tolerated well  Mobility visit 1 Mobility  Mobility Specialist Start Time (ACUTE ONLY) 0913  Mobility Specialist Stop Time (ACUTE ONLY) [id number]  Mobility Specialist Time Calculation (min) (ACUTE ONLY) 10 min   Pt was found in bed and requested assistance to bathroom. Was left in bathroom with NT in room. Understood to pull call bell when finished.   Erminio Leos,  Mobility Specialist Can be reached via Secure Chat

## 2024-11-07 NOTE — Progress Notes (Signed)
   11/07/24 0016  BiPAP/CPAP/SIPAP  $ Non-Invasive Ventilator  Non-Invasive Vent Subsequent  $ Face Mask Medium Yes  BiPAP/CPAP/SIPAP Pt Type Adult  BiPAP/CPAP/SIPAP V60  Mask Type Full face mask  Dentures removed? Not applicable  Mask Size Medium  Set Rate 14 breaths/min  Respiratory Rate 29 breaths/min  IPAP 14 cmH20  EPAP 6 cmH2O  FiO2 (%) (S)  30 % (decreased due to a decrease of oxygen from 4 L to 2L during the day)  Flow Rate 0 lpm  Minute Ventilation 10.6  Leak 7  Peak Inspiratory Pressure (PIP) 14  Tidal Volume (Vt) 416  Patient Home Machine No  Patient Home Mask No  Patient Home Tubing No  Auto Titrate No  Press High Alarm 25 cmH2O  Press Low Alarm 5 cmH2O  Device Plugged into RED Power Outlet Yes  Oxygen Percent 30 %  BiPAP/CPAP /SiPAP Vitals  Resp 19  MEWS Score/Color  MEWS Score 0  MEWS Score Color Green

## 2024-11-07 NOTE — Plan of Care (Signed)
  Problem: Education: Goal: Knowledge of General Education information will improve Description: Including pain rating scale, medication(s)/side effects and non-pharmacologic comfort measures Outcome: Progressing   Problem: Health Behavior/Discharge Planning: Goal: Ability to manage health-related needs will improve Outcome: Progressing   Problem: Clinical Measurements: Goal: Respiratory complications will improve Outcome: Progressing Goal: Cardiovascular complication will be avoided Outcome: Progressing   Problem: Activity: Goal: Risk for activity intolerance will decrease Outcome: Progressing   Problem: Nutrition: Goal: Adequate nutrition will be maintained Outcome: Progressing   Problem: Coping: Goal: Level of anxiety will decrease Outcome: Progressing   Problem: Elimination: Goal: Will not experience complications related to bowel motility Outcome: Progressing Goal: Will not experience complications related to urinary retention Outcome: Progressing   Problem: Pain Managment: Goal: General experience of comfort will improve and/or be controlled Outcome: Progressing   Problem: Safety: Goal: Ability to remain free from injury will improve Outcome: Progressing   Problem: Skin Integrity: Goal: Risk for impaired skin integrity will decrease Outcome: Progressing   Problem: Education: Goal: Knowledge of disease or condition will improve Outcome: Progressing   Problem: Activity: Goal: Ability to tolerate increased activity will improve Outcome: Progressing Goal: Will verbalize the importance of balancing activity with adequate rest periods Outcome: Progressing

## 2024-11-07 NOTE — Progress Notes (Signed)
 Physical Therapy Treatment Patient Details Name: Victoria Rice MRN: [id number] DOB: 09/13/1931 Today's Date: 11/07/2024   SATURATION QUALIFICATIONS: (This note is used to comply with regulatory documentation for home oxygen)  Patient Saturations on Room Air at Rest = 84%  Patient Saturations on [employer] while Ambulating = n/a  Patient Saturations on 2 Liters of oxygen while Ambulating = 92%    History of Present Illness 89 yo female admitted with COPD exac. Hx of COPD, breast Ca, skin Ca, neuroendocrine Ca    PT Comments  Pt agreeable to working with therapy.  Pt up in recliner without O2--SpO2 84%. Placed 2L [place] O2  sats recovered to 94%. Pt ambulated length of hallway (~4 minutes): 87% on 1L, 92% on 2L. She has some dyspnea with exertion. While in room, encouraged pt to continue breathing exercises as tolerated. Left patient on 2L at rest sats 92%-made RN aware. Continue to recommend HHPT.    If plan is discharge home, recommend the following: A little help with walking and/or transfers;A little help with bathing/dressing/bathroom;Assistance with cooking/housework;Assist for transportation;Help with stairs or ramp for entrance   Can travel by private vehicle        Equipment Recommendations  None recommended by PT    Recommendations for Other Services       Precautions / Restrictions Precautions Precautions: Fall Precaution/Restrictions Comments: monitor O2 Restrictions Weight Bearing Restrictions Per Provider Order: No     Mobility  Bed Mobility               General bed mobility comments: oob in recliner    Transfers Overall transfer level: Needs assistance   Transfers: Sit to/from Stand Sit to Stand: Contact guard assist           General transfer comment: mild unsteadiness with intial standing.    Ambulation/Gait Ambulation/Gait assistance: Contact guard assist Gait Distance (Feet): 350 Feet Assistive device: Rollator (4 wheels) Gait  Pattern/deviations: Step-through pattern, Decreased stride length       General Gait Details: Used rollator for longer distance. Good gait speed. No LOB with rollator use. O2 87% on 1L, 92% on 2L.   Stairs             Wheelchair Mobility     Tilt Bed    Modified Rankin (Stroke Patients Only)       Balance Overall balance assessment: Mild deficits observed, not formally tested                                          Communication Communication Communication: No apparent difficulties  Cognition Arousal: Alert Behavior During Therapy: WFL for tasks assessed/performed   PT - Cognitive impairments: No apparent impairments                         Following commands: Intact      Cueing Cueing Techniques: Verbal cues  Exercises      General Comments        Pertinent Vitals/Pain Pain Assessment Pain Assessment: No/denies pain    Home Living                          Prior Function            PT Goals (current goals can now be found in the care plan section) Progress  towards PT goals: Progressing toward goals    Frequency    Min 3X/week      PT Plan      Co-evaluation              AM-PAC PT 6 Clicks Mobility   Outcome Measure  Help needed turning from your back to your side while in a flat bed without using bedrails?: None Help needed moving from lying on your back to sitting on the side of a flat bed without using bedrails?: None Help needed moving to and from a bed to a chair (including a wheelchair)?: None Help needed standing up from a chair using your arms (e.g., wheelchair or bedside chair)?: None Help needed to walk in hospital room?: A Little Help needed climbing 3-5 steps with a railing? : A Little 6 Click Score: 22    End of Session Equipment Utilized During Treatment: Oxygen Activity Tolerance: Patient tolerated treatment well Patient left: in chair;with call bell/phone within  reach   PT Visit Diagnosis: Difficulty in walking, not elsewhere classified (R26.2);Unsteadiness on feet (R26.81)     Time: 8842-8787 PT Time Calculation (min) (ACUTE ONLY): 15 min  Charges:    $Gait Training: 8-22 mins PT General Charges $$ ACUTE PT VISIT: 1 Visit                        Dannial SQUIBB, PT Acute Rehabilitation  Office: [phone]

## 2024-11-07 NOTE — Progress Notes (Signed)
   11/07/24 2236  Assess: MEWS Score  Temp 99.2 F (37.3 C)  BP (!) 152/134  MAP (mmHg) 142  Pulse Rate (!) 151  Resp 17  SpO2 97 %  O2 Device Nasal Cannula  Assess: MEWS Score  MEWS Temp 0  MEWS Systolic 0  MEWS Pulse 3  MEWS RR 0  MEWS LOC 0  MEWS Score 3  MEWS Score Color Yellow  Assess: if the MEWS score is Yellow or Red  Were vital signs accurate and taken at a resting state? Yes  Does the patient meet 2 or more of the SIRS criteria? No  MEWS guidelines implemented  Yes, yellow  Treat  MEWS Interventions Considered administering scheduled or prn medications/treatments as ordered  Take Vital Signs  Increase Vital Sign Frequency  Yellow: Q2hr x1, continue Q4hrs until patient remains green for 12hrs  Escalate  MEWS: Escalate Yellow: Discuss with charge nurse and consider notifying provider and/or RRT  Notify: Charge Nurse/RN  Name of Charge Nurse/RN Notified Lauren, RN  Provider Notification  Provider Name/Title Laneta HERO. Donati-Garmon, NP  Date Provider Notified 11/07/24  Time Provider Notified 2245  Method of Notification Page (Via secure chat)  Notification Reason Change in status  Provider response Other (Comment) (Ordered to give Tylenol , Xanax  and recheck BP in another arm)  Date of Provider Response 11/07/24  Time of Provider Response 2247  Assess: SIRS CRITERIA  SIRS Temperature  0  SIRS Respirations  0  SIRS Pulse 1  SIRS WBC 0  SIRS Score Sum  1

## 2024-11-08 DIAGNOSIS — J9621 Acute and chronic respiratory failure with hypoxia: Secondary | ICD-10-CM | POA: Diagnosis not present

## 2024-11-08 MED ORDER — IPRATROPIUM-ALBUTEROL 0.5-2.5 (3) MG/3ML IN SOLN
3.0000 mL | Freq: Four times a day (QID) | RESPIRATORY_TRACT | Status: DC
  Administered 2024-11-08 (×2): 3 mL via RESPIRATORY_TRACT
  Filled 2024-11-08 (×2): qty 3

## 2024-11-08 MED ORDER — METHYLPREDNISOLONE SODIUM SUCC 40 MG IJ SOLR
40.0000 mg | Freq: Two times a day (BID) | INTRAMUSCULAR | Status: DC
  Administered 2024-11-08 – 2024-11-09 (×3): 40 mg via INTRAVENOUS
  Filled 2024-11-08 (×3): qty 1

## 2024-11-08 NOTE — Progress Notes (Signed)
   11/08/24 0028  BiPAP/CPAP/SIPAP  BiPAP/CPAP/SIPAP Pt Type Adult  Reason BIPAP/CPAP not in use Other(comment) (pt refused tonight)

## 2024-11-08 NOTE — Progress Notes (Signed)
 Assuming care of patient from off-going RN. Agree w/ previously charted shift assessment and daily documentation. Will continue care until shift change- 1900.

## 2024-11-08 NOTE — Progress Notes (Signed)
 Please PROGRESS NOTE    Victoria MCBAIN  FMW:[id number] DOB: 09/03/1931 DOA: 11/04/2024 PCP: Aisha Harvey, MD   Brief Narrative:  89 y.o. female with medical history significant of COPD, HTN, hypothyroidism, breast cancer, neuroendocrine carcinoma of the lung, skin cancer presented with worsening shortness of breath.  On presentation, saturations were in the 60s needing BiPAP.  D-dimer of 0.61, normal troponins, WBC of 13.8, proBNP of 554.  Chest x-ray showed no evidence of pneumonia or pulmonary edema.  She was started on IV Solu-Medrol .  Assessment & Plan:   Acute respiratory failure with hypoxia COPD exacerbation - Presented with worsening shortness of breath and saturations of 60s needing BiPAP.  Currently on 2  L oxygen via nasal cannula.  Wean off as able.  Chest x-ray as above.  RSV/influenza/COVID PCR negative - Increase Solu-Medrol  to 40 mg IV every 12 hours.  Continue nebs and current inhaled regimen. Continue Zithromax .  GERD - Patient complaining of some acid reflux but better since yesterday.  Protonix  started yesterday.  Continue Protonix  and Maalox as needed.  Hypothyroidism--continue levothyroxine   Hypertension - Continue enalapril   Leukocytosis - Monitor intermittently.  Possibly reactive.  Hyponatremia - Mild.  Improving.  Monitor intermittently.  Encourage oral intake.  Physical deconditioning - PT recommended home health PT  DVT prophylaxis: Lovenox  Code Status: DNR.   Family Communication: None at bedside Disposition Plan: Status is: Inpatient Remains inpatient appropriate because: Of severity of illness    Consultants: None  Procedures: None  Antimicrobials: Zithromax   Subjective: Patient seen and examined at bedside.  Feels slightly better today but still having intermittent cough and shortness of breath and some acid reflux.  Does not feel ready to go home today.  No fever or vomiting reported. Objective: Vitals:   11/08/24 0415  11/08/24 0500 11/08/24 0913 11/08/24 0915  BP: (!) 145/81     Pulse: 81     Resp: 16     Temp: 98 F (36.7 C)     TempSrc: Oral     SpO2: 97%  96% 96%  Weight:  50.3 kg    Height:        Intake/Output Summary (Last 24 hours) at 11/08/2024 0919 Last data filed at 11/07/2024 1900 Gross per 24 hour  Intake 486 ml  Output --  Net 486 ml   Filed Weights   11/06/24 0440 11/07/24 0702 11/08/24 0500  Weight: 48.7 kg 50 kg 50.3 kg    Examination:  General: Remains on 2 L oxygen via nasal cannula.  No distress.  ENT/neck: No obvious JVD elevation or palpable thyromegaly  respiratory: Decreased breath sounds at bases bilaterally with no wheezing  CVS: S1-S2 heard; rate currently controlled abdominal: Soft, nontender, mildly distended; no organomegaly, normal bowel sounds heard  extremities: Trace lower extremity edema; no cyanosis  CNS: Awake and alert.  No obvious focal deficits  lymph: No lymphadenopathy palpable  skin: No obvious ecchymosis/lesions psych: Flat affect mostly.  Not agitated  musculoskeletal: No obvious joint swelling/deformity   Data Reviewed: I have personally reviewed following labs and imaging studies  CBC: Recent Labs  Lab 11/04/24 1537 11/05/24 0429 11/06/24 0456 11/07/24 0414  WBC 13.8* 11.8* 13.7* 13.9*  NEUTROABS 10.8*  --  12.0* 12.3*  HGB 14.3 12.5 12.7 12.5  HCT 44.5 39.4 40.2 39.5  MCV 92.1 92.5 93.3 94.3  PLT 297 294 332 359   Basic Metabolic Panel: Recent Labs  Lab 11/04/24 1537 11/05/24 0429 11/06/24 0456 11/07/24 0414  NA 127* 131*  130* 132*  K 4.4 4.7 4.9 4.9  CL 89* 94* 93* 93*  CO2 28 29 32 34*  GLUCOSE 139* 131* 152* 142*  BUN 13 12 13 15   CREATININE 0.66 0.59 0.63 0.59  CALCIUM  9.4 8.8* 8.9 8.9  MG  --   --  2.2 2.3   GFR: Estimated Creatinine Clearance: 32.9 mL/min (by C-G formula based on SCr of 0.59 mg/dL). Liver Function Tests: Recent Labs  Lab 11/05/24 0429  AST 30  ALT 16  ALKPHOS 97  BILITOT 0.3  PROT  6.3*  ALBUMIN 3.5   No results for input(s): LIPASE, AMYLASE in the last 168 hours. No results for input(s): AMMONIA in the last 168 hours. Coagulation Profile: No results for input(s): INR, PROTIME in the last 168 hours. Cardiac Enzymes: No results for input(s): CKTOTAL, CKMB, CKMBINDEX, TROPONINI in the last 168 hours. BNP (last 3 results) Recent Labs    11/04/24 1537  PROBNP 554.0*   HbA1C: No results for input(s): HGBA1C in the last 72 hours. CBG: No results for input(s): GLUCAP in the last 168 hours. Lipid Profile: No results for input(s): CHOL, HDL, LDLCALC, TRIG, CHOLHDL, LDLDIRECT in the last 72 hours. Thyroid  Function Tests: No results for input(s): TSH, T4TOTAL, FREET4, T3FREE, THYROIDAB in the last 72 hours.  Anemia Panel: No results for input(s): VITAMINB12, FOLATE, FERRITIN, TIBC, IRON, RETICCTPCT in the last 72 hours. Sepsis Labs: Recent Labs  Lab 11/04/24 1551  LATICACIDVEN 0.9    Recent Results (from the past 240 hours)  Culture, blood (routine x 2)     Status: None (Preliminary result)   Collection Time: 11/04/24  3:25 PM   Specimen: BLOOD RIGHT FOREARM  Result Value Ref Range Status   Specimen Description   Final    BLOOD RIGHT FOREARM Performed at [facility], [address], [place], [place] [address]    Special Requests   Final    BOTTLES DRAWN AEROBIC AND ANAEROBIC Blood Culture results may not be optimal due to an inadequate volume of blood received in culture bottles Performed at [facility] [address], [place], [place] [address]    Culture   Final    NO GROWTH 4 DAYS Performed at [facility], [address], [place], [place] [address]    Report Status PENDING  Incomplete  Culture, blood (routine x 2)     Status: None (Preliminary result)   Collection Time: 11/04/24  3:30 PM   Specimen: BLOOD  Result Value Ref Range Status   Specimen Description    Final    BLOOD RIGHT ANTECUBITAL Performed at [facility], [address], [place], [place] [address]    Special Requests   Final    BOTTLES DRAWN AEROBIC AND ANAEROBIC Blood Culture adequate volume Performed at [facility], [address] [place], [place] [address]    Culture   Final    NO GROWTH 4 DAYS Performed at [facility], [address], [place], [place] [address]    Report Status PENDING  Incomplete  Resp panel by RT-PCR (RSV, Flu A&B, Covid) Anterior Nasal Swab     Status: None   Collection Time: 11/04/24  3:33 PM   Specimen: Anterior Nasal Swab  Result Value Ref Range Status   SARS Coronavirus 2 by RT PCR NEGATIVE NEGATIVE Final    Comment: (NOTE) SARS-CoV-2 target nucleic acids are NOT DETECTED.  The SARS-CoV-2 RNA is generally detectable in upper  respiratory specimens during the acute phase of infection. The lowest concentration of SARS-CoV-2 viral copies this assay can detect is 138 copies/mL. A negative result does not preclude SARS-Cov-2 infection and should not be used as the sole basis for treatment or other patient management decisions. A negative result may occur with  improper specimen collection/handling, submission of specimen other than nasopharyngeal swab, presence of viral mutation(s) within the areas targeted by this assay, and inadequate number of viral copies(<138 copies/mL). A negative result must be combined with clinical observations, patient history, and epidemiological information. The expected result is Negative.  Fact Sheet for Patients:  bloggercourse.com  Fact Sheet for Healthcare Providers:  seriousbroker.it  This test is no t yet approved or cleared by the [place]  FDA and  has been authorized for detection and/or diagnosis of SARS-CoV-2 by FDA under an Emergency Use Authorization (EUA). This EUA will remain  in effect (meaning this  test can be used) for the duration of the COVID-19 declaration under Section 564(b)(1) of the Act, 21 U.S.C.section 360bbb-3(b)(1), unless the authorization is terminated  or revoked sooner.       Influenza A by PCR NEGATIVE NEGATIVE Final   Influenza B by PCR NEGATIVE NEGATIVE Final    Comment: (NOTE) The Xpert Xpress SARS-CoV-2/FLU/RSV plus assay is intended as an aid in the diagnosis of influenza from Nasopharyngeal swab specimens and should not be used as a sole basis for treatment. Nasal washings and aspirates are unacceptable for Xpert Xpress SARS-CoV-2/FLU/RSV testing.  Fact Sheet for Patients: bloggercourse.com  Fact Sheet for Healthcare Providers: seriousbroker.it  This test is not yet approved or cleared by the [place]  FDA and has been authorized for detection and/or diagnosis of SARS-CoV-2 by FDA under an Emergency Use Authorization (EUA). This EUA will remain in effect (meaning this test can be used) for the duration of the COVID-19 declaration under Section 564(b)(1) of the Act, 21 U.S.C. section 360bbb-3(b)(1), unless the authorization is terminated or revoked.     Resp Syncytial Virus by PCR NEGATIVE NEGATIVE Final    Comment: (NOTE) Fact Sheet for Patients: bloggercourse.com  Fact Sheet for Healthcare Providers: seriousbroker.it  This test is not yet approved or cleared by the [place]  FDA and has been authorized for detection and/or diagnosis of SARS-CoV-2 by FDA under an Emergency Use Authorization (EUA). This EUA will remain in effect (meaning this test can be used) for the duration of the COVID-19 declaration under Section 564(b)(1) of the Act, 21 U.S.C. section 360bbb-3(b)(1), unless the authorization is terminated or revoked.  Performed at [facility], [address], [place], [place] [address]          Radiology  Studies: DG CHEST PORT 1 VIEW Result Date: 11/07/2024 EXAM: 1 VIEW(S) XRAY OF THE CHEST 11/07/2024 05:27:00 PM COMPARISON: 11/04/2024 CLINICAL HISTORY: Dyspnea FINDINGS: LUNGS AND PLEURA: Chronic coarsened interstitial markings without pulmonary edema. Trace bilateral pleural effusions. No pneumothorax. HEART AND MEDIASTINUM: Aortic arch atherosclerosis. No acute abnormality of the cardiac and mediastinal silhouettes. BONES AND SOFT TISSUES: Bilateral surgical clips noted. No acute osseous abnormality. IMPRESSION: 1. Trace bilateral pleural effusions. 2. Chronic coarsened interstitial markings without pulmonary edema. Electronically signed by: Franky Stanford MD 11/07/2024 10:16 PM EST RP Workstation: [id number]         Scheduled Meds:  azithromycin  500 mg Oral Daily   budesonide -glycopyrrolate -formoterol  2 puff Inhalation BID   cholecalciferol  1,000 Units Oral Q lunch   enalapril  2.5 mg Oral Daily   enoxaparin  (  LOVENOX ) injection  30 mg Subcutaneous Q24H   ipratropium-albuterol   3 mL Nebulization TID   levothyroxine   50 mcg Oral Q0600   methylPREDNISolone  (SOLU-MEDROL ) injection  80 mg Intravenous Q12H   pantoprazole   40 mg Oral Daily   sodium chloride  flush  3 mL Intravenous Q12H   Continuous Infusions:          Sophie Mao, MD [facility] 11/08/2024, 9:19 AM '

## 2024-11-08 NOTE — Progress Notes (Signed)
 Mobility Specialist - Progress Note   11/08/24 0857  Mobility  Activity Ambulated with assistance  Level of Assistance Standby assist, set-up cues, supervision of patient - no hands on  Assistive Device Front wheel walker  Distance Ambulated (ft) 250 ft  Range of Motion/Exercises Active  Activity Response Tolerated well  Mobility Referral Yes  Mobility visit 1 Mobility  Mobility Specialist Start Time (ACUTE ONLY) 0857  Mobility Specialist Stop Time (ACUTE ONLY) 0913  Mobility Specialist Time Calculation (min) (ACUTE ONLY) 16 min   Received in bed and agreed to mobility. On 2L, pt ambulated into restroom. Left for hallway where pt had no c/o pain nor discomfort. Returned to chair with all needs met, RT in room. Did not desaturate throughout ambulation.  Cyndee Ada Mobility Specialist

## 2024-11-08 NOTE — Progress Notes (Signed)
 Patient just got home O2 delivered to bedside and says she does not want oxygen for at home. I explained how she met the criteria to have it and told her the importance of using it short term. Patient says she does not want it, she has had it before and does not want to deal with that at home again. She explains especially since her O2 screening was just 1 number away from not requiring O2. She asks if she can get reevaluated tomorrow and see if she does not need the O2 at discharge. MD and social worker made aware via secure chat.

## 2024-11-08 NOTE — Progress Notes (Addendum)
 Mobility Specialist - Progress Note  Lamar 2 L  11/08/24 1333  Mobility  Activity Ambulated with assistance  Level of Assistance Standby assist, set-up cues, supervision of patient - no hands on  Assistive Device Front wheel walker  Distance Ambulated (ft) 140 ft  Activity Response Tolerated fair  Mobility Referral Yes  Mobility visit 1 Mobility  Mobility Specialist Start Time (ACUTE ONLY) 1310  Mobility Specialist Stop Time (ACUTE ONLY) 1330  Mobility Specialist Time Calculation (min) (ACUTE ONLY) 20 min   Pt was received in bed and agreed to O2 test:  Nurse requested Mobility Specialist to perform oxygen saturation test with pt which includes removing pt from oxygen both at rest and while ambulating.  Below are the results from that testing.     Patient Saturations on Room Air at Rest = spO2 89%  Patient Saturations on Room Air while Ambulating = sp02 86-87% .  Rested and performed pursed lip breathing for 1 minute with sp02 at 87%.  Patient Saturations on 2 Liters of oxygen while Ambulating = sp02 93%  At end of testing pt left in room on 2 Liters of oxygen.  Pt had no complaints during ambulation. Returned to bed with all needs met. Call bell in reach. RN notified.  Reported results to nurse.   [employer] - Mobility Specialist

## 2024-11-08 NOTE — TOC Progression Note (Signed)
 Transition of Care [facility]) - Progression Note   Patient Details  Name: Victoria Rice MRN: [id number] Date of Birth: 1931-11-24  [facility] CM/SW Contact  Duwaine GORMAN Aran, LCSW Phone Number: 11/08/2024, 3:38 PM  Clinical Narrative: Patient will need home oxygen. CSW met with patient and SIL, [name], to discuss home oxygen. Patient reported she would prefer to discharge home without oxygen, but is agreeable to being set up with [employer] for home oxygen. CSW made DME referral to [facility] with [employer]. [employer] to deliver tank to patient's room. Care management to follow.  Expected Discharge Plan: Home w Home Health Services Barriers to Discharge: Continued Medical Work up  Expected Discharge Plan and Services In-house Referral: Clinical Social Work Post Acute Care Choice: Home Health, Durable Medical Equipment Living arrangements for the past 2 months: Independent Living Facility           DME Arranged: Oxygen DME Agency: [employer] Date DME Agency Contacted: 11/08/24 Time DME Agency Contacted: 1454 Representative spoke with at DME Agency: Mitch HH Arranged: PT HH Agency: Other - See comment [employer]/support/administrative Officer)  Social Drivers of Health (SDOH) Interventions SDOH Screenings   Food Insecurity: No Food Insecurity (11/04/2024)  Housing: Low Risk  (11/04/2024)  Transportation Needs: No Transportation Needs (11/04/2024)  Utilities: Not At Risk (11/04/2024)  Social Connections: Moderately Isolated (11/04/2024)  Tobacco Use: Medium Risk (11/05/2024)   Readmission Risk Interventions     No data to display

## 2024-11-08 NOTE — TOC Initial Note (Signed)
 Transition of Care [facility]) - Initial/Assessment Note   Patient Details  Name: Victoria Rice MRN: [id number] Date of Birth: 01/04/1931  Transition of Care [facility]) CM/SW Contact:    Duwaine GORMAN Aran, LCSW Phone Number: 11/08/2024, 1:32 PM  Clinical Narrative: PT evaluation recommended HHPT. CSW met with patient to discuss recommendations. Patient reported she lives at [facility] and would like to get PT at the facility. CSW spoke with Izetta at [facility] and confirmed patient can receive PT onsite through [facility]. CSW faxed HHPT orders to [facility] [phone]). Care management to follow.  Expected Discharge Plan: Home w Home Health Services Barriers to Discharge: Continued Medical Work up  Patient Goals and CMS Choice Patient states their goals for this hospitalization and ongoing recovery are:: Get PT at [facility] CMS Medicare.gov Compare Post Acute Care list provided to:: Patient Choice offered to / list presented to : Patient  Expected Discharge Plan and Services In-house Referral: Clinical Social Work Post Acute Care Choice: Home Health Living arrangements for the past 2 months: Independent Living Facility           DME Arranged: N/A DME Agency: NA HH Arranged: PT HH Agency: Other - See comment [employer]/support/administrative Officer)  Prior Living Arrangements/Services Living arrangements for the past 2 months: Independent Living Facility Lives with:: Self Patient language and need for interpreter reviewed:: Yes Do you feel safe going back to the place where you live?: Yes      Need for Family Participation in Patient Care: No (Comment) Care giver support system in place?: Yes (comment) Criminal Activity/Legal Involvement Pertinent to Current Situation/Hospitalization: No - Comment as needed  Activities of Daily Living ADL Screening (condition at time of admission) Independently performs ADLs?: Yes (appropriate for developmental age) Is the patient deaf or have  difficulty hearing?: No Does the patient have difficulty seeing, even when wearing glasses/contacts?: No Does the patient have difficulty concentrating, remembering, or making decisions?: No  Emotional Assessment Appearance:: Appears stated age Attitude/Demeanor/Rapport: Engaged Affect (typically observed): Accepting Orientation: : Oriented to Self, Oriented to Place, Oriented to  Time, Oriented to Situation Alcohol  / Substance Use: Not Applicable Psych Involvement: No (comment)  Admission diagnosis:  Acute respiratory failure with hypoxia (HCC) [J96.01] COPD with acute exacerbation (HCC) [J44.1] Acute bronchitis, unspecified organism [J20.9] Hypoxic respiratory failure (HCC) [J96.91] Patient Active Problem List   Diagnosis Date Noted   Acute respiratory failure with hypoxia (HCC) 11/04/2024   Hypercholesterolemia 11/04/2024   Thyroid  nodule 11/04/2024   Osteoporosis with current pathological fracture with routine healing 11/04/2024   Peripheral venous insufficiency 11/04/2024   Vitamin D deficiency 11/04/2024   Chronic respiratory failure with hypoxia and hypercapnia (HCC) 01/09/2022   Physical deconditioning 01/09/2022   Protein-calorie malnutrition, severe 01/09/2022   Bronchiectasis (HCC) 12/30/2021   Constipation 12/30/2021   Diastolic heart failure (HCC) 12/30/2021   Essential hypertension 12/30/2021   Hypothyroidism 12/30/2021   Insomnia 12/30/2021   Malignant neoplasm of unspecified part of unspecified bronchus or lung (HCC) 12/15/2021   Hyponatremia 12/15/2021   Acute respiratory failure with hypercapnia (HCC) 12/15/2021   Aortic atherosclerosis 10/31/2020   Emphysema of lung (HCC) 10/31/2020   Sebaceous cyst of labia 08/18/2018   Neuroendocrine carcinoma of lung (HCC) 10/26/2016   COPD, severe (HCC) 07/31/2016   Breast cancer of upper-outer quadrant of left female breast (HCC) 05/22/2016   PCP:  Aisha Harvey, MD Pharmacy:   [facility] [id number] -  [place], [place] - [address] [place] [address] Phone: [phone] Fax: [phone]  Social Drivers of Health (SDOH) Social History: SDOH Screenings   Food Insecurity: No Food Insecurity (11/04/2024)  Housing: Low Risk  (11/04/2024)  Transportation Needs: No Transportation Needs (11/04/2024)  Utilities: Not At Risk (11/04/2024)  Social Connections: Moderately Isolated (11/04/2024)  Tobacco Use: Medium Risk (11/05/2024)   SDOH Interventions:    Readmission Risk Interventions     No data to display

## 2024-11-08 NOTE — Progress Notes (Signed)
 SATURATION QUALIFICATIONS: (This note is used to comply with regulatory documentation for home oxygen)  Patient Saturations on Room Air at Rest = 89%  Patient Saturations on Room Air while Ambulating = 87%  Patient Saturations on 2 Liters of oxygen while Ambulating = 93%  Please briefly explain why patient needs home oxygen:  Patient requires supplemental oxygen in order to maintain saturation level greater than 90%.

## 2024-11-08 NOTE — Plan of Care (Signed)
  Problem: Clinical Measurements: Goal: Respiratory complications will improve Outcome: Progressing   Problem: Clinical Measurements: Goal: Cardiovascular complication will be avoided Outcome: Progressing   Problem: Nutrition: Goal: Adequate nutrition will be maintained Outcome: Progressing   

## 2024-11-09 ENCOUNTER — Other Ambulatory Visit (HOSPITAL_COMMUNITY): Payer: Self-pay

## 2024-11-09 LAB — CULTURE, BLOOD (ROUTINE X 2)
Culture: NO GROWTH
Culture: NO GROWTH
Special Requests: ADEQUATE

## 2024-11-09 LAB — CBC WITH DIFFERENTIAL/PLATELET
Abs Immature Granulocytes: 0.1 K/uL — ABNORMAL HIGH (ref 0.00–0.07)
Basophils Absolute: 0 K/uL (ref 0.0–0.1)
Basophils Relative: 0 %
Eosinophils Absolute: 0 K/uL (ref 0.0–0.5)
Eosinophils Relative: 0 %
HCT: 45.9 % (ref 36.0–46.0)
Hemoglobin: 14.5 g/dL (ref 12.0–15.0)
Immature Granulocytes: 1 %
Lymphocytes Relative: 6 %
Lymphs Abs: 0.6 K/uL — ABNORMAL LOW (ref 0.7–4.0)
MCH: 29.1 pg (ref 26.0–34.0)
MCHC: 31.6 g/dL (ref 30.0–36.0)
MCV: 92.2 fL (ref 80.0–100.0)
Monocytes Absolute: 0.7 K/uL (ref 0.1–1.0)
Monocytes Relative: 7 %
Neutro Abs: 9.4 K/uL — ABNORMAL HIGH (ref 1.7–7.7)
Neutrophils Relative %: 86 %
Platelets: 418 K/uL — ABNORMAL HIGH (ref 150–400)
RBC: 4.98 MIL/uL (ref 3.87–5.11)
RDW: 12.2 % (ref 11.5–15.5)
WBC: 10.8 K/uL — ABNORMAL HIGH (ref 4.0–10.5)
nRBC: 0 % (ref 0.0–0.2)

## 2024-11-09 LAB — MAGNESIUM: Magnesium: 2.3 mg/dL (ref 1.7–2.4)

## 2024-11-09 LAB — BASIC METABOLIC PANEL WITH GFR
Anion gap: 7 (ref 5–15)
BUN: 13 mg/dL (ref 8–23)
CO2: 35 mmol/L — ABNORMAL HIGH (ref 22–32)
Calcium: 8.9 mg/dL (ref 8.9–10.3)
Chloride: 87 mmol/L — ABNORMAL LOW (ref 98–111)
Creatinine, Ser: 0.61 mg/dL (ref 0.44–1.00)
GFR, Estimated: >60 mL/min (ref >60)
Glucose, Bld: 126 mg/dL — ABNORMAL HIGH (ref 70–99)
Potassium: 5 mmol/L (ref 3.5–5.1)
Sodium: 129 mmol/L — ABNORMAL LOW (ref 135–145)

## 2024-11-09 MED ORDER — IPRATROPIUM-ALBUTEROL 0.5-2.5 (3) MG/3ML IN SOLN
3.0000 mL | Freq: Three times a day (TID) | RESPIRATORY_TRACT | Status: DC
  Administered 2024-11-09: 3 mL via RESPIRATORY_TRACT
  Filled 2024-11-09: qty 3

## 2024-11-09 MED ORDER — IPRATROPIUM-ALBUTEROL 0.5-2.5 (3) MG/3ML IN SOLN
3.0000 mL | RESPIRATORY_TRACT | 0 refills | Status: AC | PRN
  Filled 2024-11-09: qty 360, 20d supply, fill #0

## 2024-11-09 MED ORDER — PREDNISONE 10 MG PO TABS
ORAL_TABLET | ORAL | 0 refills | Status: AC
  Filled 2024-11-09: qty 30, 12d supply, fill #0

## 2024-11-09 NOTE — TOC Transition Note (Addendum)
 Transition of Care [facility]) - Discharge Note   Patient Details  Name: Victoria Rice MRN: [id number] Date of Birth: [birth year]  Transition [facility] Care [facility]) CM/SW Contact:  Bascom Service, RN Phone Number: 11/09/2024, 10:28 AM   Clinical Narrative: Spoke to patient agrees to home 02,neb machine for home-[employer] rep Thomasina has already delivered home 02 to rm-he will deliver neb machine to rm prior d/c. HHPT already set up see prior IP CM note. Has own transport home. No further CM needs.  -10:50a-Per patient prefer delivery of home neb machine to be delivered to [facility] [place]-rep Thomasina will deliver to [facility] [place] to address on demographic sheet.No further CM needs.  -2:24p Received call frm Lyndsay Dir of floor about d/c back to [facility] Ind[facility] living w/home 02-I contacted rep Izetta who is aware of returning back to Indep living same rm# 214 w/home 02 through [employer] to deliver additional tanks & concentrator to [facility] living rm-Katie has also informed the coordinator of returning back to rm prior the d/c earlier today. No further CM needs.    Final next level of care: Home w Home Health Services Barriers to Discharge: No Barriers Identified   Patient Goals and CMS Choice Patient states their goals for this hospitalization and ongoing recovery are:: Get PT at [facility] CMS Medicare.gov Compare Post Acute Care list provided to:: Patient Choice offered to / list presented to : Patient [place] ownership interest in [facility].provided to:: Patient    Discharge Placement                       Discharge Plan and Services Additional resources added to the After Visit Summary for   In-house Referral: Clinical Social Work Discharge Planning Services: CM Consult Post Acute Care Choice: Durable Medical Equipment, Home Health          DME Arranged: Nebulizer machine DME Agency: [employer] Date DME Agency Contacted:  11/09/24 Time DME Agency Contacted: 1027 Representative spoke with at DME Agency: Thomasina HH Arranged: PT HH Agency: Other - See comment [facility] [place] rep Katie cotnracts with [facility] already has HHPPT order.)        Social Drivers of Health (SDOH) Interventions SDOH Screenings   Food Insecurity: No Food Insecurity (11/04/2024)  Housing: Low Risk  (11/04/2024)  Transportation Needs: No Transportation Needs (11/04/2024)  Utilities: Not At Risk (11/04/2024)  Social Connections: Moderately Isolated (11/04/2024)  Tobacco Use: Medium Risk (11/05/2024)     Readmission Risk Interventions     No data to display

## 2024-11-09 NOTE — Progress Notes (Signed)
 SATURATION QUALIFICATIONS: (This note is used to comply with regulatory documentation for home oxygen)  Patient Saturations on Room Air at Rest = 87%  Patient Saturations on Room Air while Ambulating = 85%  Patient Saturations on 2 Liters of oxygen while Ambulating = 94%  Please briefly explain why patient needs home oxygen:  Patient requires supplemental oxygen in order to maintain oxygen saturation level greater than 90%.

## 2024-11-09 NOTE — Progress Notes (Signed)
   11/09/24 0020  BiPAP/CPAP/SIPAP  BiPAP/CPAP/SIPAP Pt Type Adult  BiPAP/CPAP/SIPAP V60  Mask Type Full face mask  Dentures removed? Not applicable  Mask Size Medium  Set Rate 14 breaths/min  Respiratory Rate 27 breaths/min  IPAP 14 cmH20  EPAP 6 cmH2O  FiO2 (%) 30 %  Flow Rate 0 lpm  Minute Ventilation 12.7  Leak 7  Peak Inspiratory Pressure (PIP) 14  Tidal Volume (Vt) 468  Patient Home Machine No  Patient Home Mask No  Patient Home Tubing No  Auto Titrate No  Press High Alarm 25 cmH2O  Press Low Alarm 5 cmH2O  Device Plugged into RED Power Outlet Yes  Oxygen Percent 30 %

## 2024-11-09 NOTE — Progress Notes (Signed)
 Discharge medications delivered to patient at the bedside.

## 2024-11-09 NOTE — Discharge Summary (Signed)
 Physician Discharge Summary  Victoria Rice FMW:[id number] DOB: June 05, 1931 DOA: 11/04/2024  PCP: Aisha Harvey, MD  Admit date: 11/04/2024 Discharge date: 11/09/2024  [facility] Disposition: [facility] with home health PT  Recommendations for Outpatient Follow-up:  Follow up with PCP in 1-2 weeks Follow-up with pulmonology, Dr. Kassie in 2 weeks Continue prednisone  taper on discharge  Home Health: PT Equipment/Devices: Nebulizer, home oxygen 2 L per nasal cannula  Discharge Condition: Stable CODE STATUS: DNR Diet recommendation: Heart healthy diet  History of present illness:  Victoria Rice is a 89 year old female with past medical history significant for COPD, HTN, hypothyroidism, breast cancer, neuroendocrine carcinoma of the lung, skin cancer presented to [facility] ED on 11/04/2024 with progressive shortness of breath.  Workup in the ED was notable for oxygen saturations in the 60s requiring initiation of BiPAP.  D-dimer 0.61, troponins within normal limits, WBC count of 13.8, proBNP 554.  Chest x-ray with no evidence of pneumonia or pulmonary edema.  Patient was started on IV steroids.  TRH consulted for admission for further evaluation and management of acute respiratory failure with hypoxia secondary to COPD exacerbation.  Hospital course:  Acute respiratory failure with hypoxia, POA COPD exacerbation Patient presenting to the ED with progressive shortness of breath.  Upon arrival, patient was noted to have oxygen saturations in the 60s and placed on BiPAP.  WBC count 13.8, proBNP 554, D-dimer 0.61 and troponins within normal limits.  Chest x-ray with no evidence of pneumonia or pulmonary edema.  Patient continued on BiPAP and started on IV steroids with scheduled neb treatments with improvement of symptoms.  On day of discharge, patient was noted to desaturate to 85% with mobility, maintained greater  than 88% on 2 L nasal cannula.  Patient will continue prednisone  taper on discharge, resume home Trelegy inhaler.  Patient furnished with nebulizer to use DuoNebs as needed for wheezing/shortness of breath.  Outpatient follow-up PCP 1-2 weeks, outpatient follow-up with pulmonology, Dr. Alto in 2 weeks.  HTN Continue enalapril  2.5 mg p.o. daily  Hypothyroidism  Continue levothyroxine  50 mcg p.o. daily  Weakness/debility/deconditioning/gait disturbance Discharging with home health PT at independent living facility.  Discharge Diagnoses:  Active Problems:   COPD, severe (HCC)   Hyponatremia   Acute respiratory failure with hypoxia (HCC)   Diastolic heart failure (HCC)   Essential hypertension   Hypercholesterolemia   Hypothyroidism    Discharge Instructions  Discharge Instructions     Call MD for:  difficulty breathing, headache or visual disturbances   Complete by: As directed    Call MD for:  extreme fatigue   Complete by: As directed    Call MD for:  persistant dizziness or light-headedness   Complete by: As directed    Call MD for:  persistant nausea and vomiting   Complete by: As directed    Call MD for:  severe uncontrolled pain   Complete by: As directed    Call MD for:  temperature >100.4   Complete by: As directed    Diet - low sodium heart healthy   Complete by: As directed    Increase activity slowly   Complete by: As directed       Allergies as of 11/09/2024       Reactions   Actonel [risedronate Sodium] Other (See Comments)   indigestion   Amlodipine  Swelling        Medication List     TAKE these medications  cholecalciferol  1000 units tablet Commonly known as: VITAMIN D  Take 1,000 Units by mouth daily with lunch.   enalapril  2.5 MG tablet Commonly known as: VASOTEC  Take 2.5 mg by mouth daily.   ipratropium-albuterol  0.5-2.5 (3) MG/3ML Soln Commonly known as: DUONEB Take 3 mLs by nebulization every 4 (four) hours as needed  (Wheezing/shortness of breath).   levothyroxine  50 MCG tablet Commonly known as: SYNTHROID  Take 50 mcg by mouth daily before breakfast.   naproxen sodium 220 MG tablet Commonly known as: ALEVE Take 220 mg by mouth 2 (two) times daily as needed (for pain.).   predniSONE  10 MG tablet Commonly known as: DELTASONE  Take 4 tablets (40 mg total) by mouth daily for 3 days, THEN 3 tablets (30 mg total) daily for 3 days, THEN 2 tablets (20 mg total) daily for 3 days, THEN 1 tablet (10 mg total) daily for 3 days. Start taking on: November 10, 2024   senna-docusate 8.6-50 MG tablet Commonly known as: Senokot-S Take 1 tablet by mouth 2 (two) times daily. What changed:  how much to take when to take this reasons to take this   Trelegy Ellipta  100-62.5-25 MCG/ACT Aepb Generic drug: Fluticasone -Umeclidin-Vilant Inhale 1 puff into the lungs daily.               Durable Medical Equipment  (From admission, onward)           Start     Ordered   11/09/24 0931  For home use only DME Nebulizer machine  Once       Question Answer Comment  Patient needs a nebulizer to treat with the following condition COPD (chronic obstructive pulmonary disease) (HCC)   Length of Need Lifetime   Additional equipment included Administration kit      11/09/24 0931   11/08/24 1337  For home use only DME oxygen  Once       Question Answer Comment  Length of Need 6 Months   Mode or (Route) Nasal cannula   Liters per Minute 2   Frequency Continuous (stationary and portable oxygen unit needed)   Oxygen conserving device Yes   Oxygen delivery system: Gas      11/08/24 1336            Follow-up Information     Aisha Harvey, MD. Schedule an appointment as soon as possible for a visit in 1 week(s).   Specialty: Family Medicine Contact information: [address] [place] [address] [phone]         Kassie Acquanetta Bradley, MD. Schedule an appointment as soon as possible for a visit in  2 week(s).   Specialty: Pulmonary Disease Contact information: [address] [place] [address] [phone]                Allergies  Allergen Reactions   Actonel [Risedronate Sodium] Other (See Comments)    indigestion   Amlodipine  Swelling    Consultations: None   Procedures/Studies: DG CHEST PORT 1 VIEW Result Date: 11/07/2024 EXAM: 1 VIEW(S) XRAY OF THE CHEST 11/07/2024 05:27:00 PM COMPARISON: 11/04/2024 CLINICAL HISTORY: Dyspnea FINDINGS: LUNGS AND PLEURA: Chronic coarsened interstitial markings without pulmonary edema. Trace bilateral pleural effusions. No pneumothorax. HEART AND MEDIASTINUM: Aortic arch atherosclerosis. No acute abnormality of the cardiac and mediastinal silhouettes. BONES AND SOFT TISSUES: Bilateral surgical clips noted. No acute osseous abnormality. IMPRESSION: 1. Trace bilateral pleural effusions. 2. Chronic coarsened interstitial markings without pulmonary edema. Electronically signed by: Franky Stanford MD 11/07/2024 10:16  PM EST RP Workstation: [id number]   DG Chest Port 1 View Result Date: 11/04/2024 CLINICAL DATA:  Short of breath. EXAM: PORTABLE CHEST 1 VIEW COMPARISON:  01/15/2024. FINDINGS: Cardiac silhouette is normal size.  No mediastinal or hilar masses. Lungs are hyperexpanded with flattened hemidiaphragms. Mild interstitial prominence mostly at the lung bases. No evidence of pneumonia or pulmonary edema. No pleural effusion or pneumothorax. Surgical vascular clips overlying both breast consistent with IMPRESSION: No active disease. Electronically Signed   By: Alm Parkins M.D.   On: 11/04/2024 16:29     Subjective: Patient seen examined bedside, sitting in bedside chair.  RN present at bedside.  Repeat O2 walk test screen this morning with desaturation to 85% with ambulation.  Discussed necessity of home oxygen, agreeable; even though she states that her home oxygen numbers are usually better given that she is more mobile in  her home environment.  Also agreeable to home nebulizer unit.  Discussed will continue prednisone  tapering dose on discharge; as well as need for outpatient follow-up with primary care and pulmonology.  No other questions or concerns at this time.  Denies headache, no visual changes, no chest pain, no palpitations, no current shortness of breath, no abdominal pain, no fever/chills/night sweats, no nausea/vomiting/diarrhea, no focal weakness, no fatigue, no paresthesias.  No acute events overnight per nursing staff.  Discharge Exam: Vitals:   11/09/24 0545 11/09/24 0907  BP: (!) 161/96   Pulse: 92   Resp: 18   Temp: 98.9 F (37.2 C)   SpO2: 98% 97%   Vitals:   11/08/24 2111 11/09/24 0434 11/09/24 0545 11/09/24 0907  BP: (!) 156/96  (!) 161/96   Pulse: 90  92   Resp: 16  18   Temp: 97.7 F (36.5 C)  98.9 F (37.2 C)   TempSrc: Oral  Oral   SpO2: 94%  98% 97%  Weight:  48.5 kg    Height:        Physical Exam: GEN: NAD, alert and oriented x 3, elderly in appearance HEENT: NCAT, PERRL, EOMI, sclera clear, MMM PULM: Breath slight diminished lower bases, no wheezes/crackles, normal respiratory effort without accessory muscle use, on 2 L nasal cannula with SpO2 97% at rest CV: RRR w/o M/G/R GI: abd soft, NTND, + BS MSK: no peripheral edema, moves all extremities independently with preserved muscle strength NEURO: CN II-XII intact, no focal deficits, sensation to light touch intact PSYCH: normal mood/affect Integumentary: dry/intact, no rashes or wounds    The results of significant diagnostics from this hospitalization (including imaging, microbiology, ancillary and laboratory) are listed below for reference.     Microbiology: Recent Results (from the past 240 hours)  Culture, blood (routine x 2)     Status: None   Collection Time: 11/04/24  3:25 PM   Specimen: BLOOD RIGHT FOREARM  Result Value Ref Range Status   Specimen Description   Final    BLOOD RIGHT FOREARM Performed  at [facility], [address], [place], [place] [address]    Special Requests   Final    BOTTLES DRAWN AEROBIC AND ANAEROBIC Blood Culture results may not be optimal due to an inadequate volume of blood received in culture bottles Performed at [facility], [address], [place], [place] [address]    Culture   Final    NO GROWTH 5 DAYS Performed at [facility], [address], [place], [place] [address]    Report Status 11/09/2024 FINAL  Final  Culture, blood (routine x 2)     Status: None   Collection Time: 11/04/24  3:30 PM   Specimen: BLOOD  Result Value Ref Range Status   Specimen Description   Final    BLOOD RIGHT ANTECUBITAL Performed at [facility], [address], [place], [place] [address]    Special Requests   Final    BOTTLES DRAWN AEROBIC AND ANAEROBIC Blood Culture adequate volume Performed at [facility], [address], [place], [place] [address]    Culture   Final    NO GROWTH 5 DAYS Performed at [facility], [address], [place], [place] [address]    Report Status 11/09/2024 FINAL  Final  Resp panel by RT-PCR (RSV, Flu A&B, Covid) Anterior Nasal Swab     Status: None   Collection Time: 11/04/24  3:33 PM   Specimen: Anterior Nasal Swab  Result Value Ref Range Status   SARS Coronavirus 2 by RT PCR NEGATIVE NEGATIVE Final    Comment: (NOTE) SARS-CoV-2 target nucleic acids are NOT DETECTED.  The SARS-CoV-2 RNA is generally detectable in upper respiratory specimens during the acute phase of infection. The lowest concentration of SARS-CoV-2 viral copies this assay can detect is 138 copies/mL. A negative result does not preclude SARS-Cov-2 infection and should not be used as the sole basis for treatment or other patient management decisions. A negative result may occur with  improper specimen collection/handling, submission of specimen other than nasopharyngeal swab, presence of  viral mutation(s) within the areas targeted by this assay, and inadequate number of viral copies(<138 copies/mL). A negative result must be combined with clinical observations, patient history, and epidemiological information. The expected result is Negative.  Fact Sheet for Patients:  bloggercourse.com  Fact Sheet for Healthcare Providers:  seriousbroker.it  This test is no t yet approved or cleared by the [place]  FDA and  has been authorized for detection and/or diagnosis of SARS-CoV-2 by FDA under an Emergency Use Authorization (EUA). This EUA will remain  in effect (meaning this test can be used) for the duration of the COVID-19 declaration under Section 564(b)(1) of the Act, 21 U.S.C.section 360bbb-3(b)(1), unless the authorization is terminated  or revoked sooner.       Influenza A by PCR NEGATIVE NEGATIVE Final   Influenza B by PCR NEGATIVE NEGATIVE Final    Comment: (NOTE) The Xpert Xpress SARS-CoV-2/FLU/RSV plus assay is intended as an aid in the diagnosis of influenza from Nasopharyngeal swab specimens and should not be used as a sole basis for treatment. Nasal washings and aspirates are unacceptable for Xpert Xpress SARS-CoV-2/FLU/RSV testing.  Fact Sheet for Patients: bloggercourse.com  Fact Sheet for Healthcare Providers: seriousbroker.it  This test is not yet approved or cleared by the [place]  FDA and has been authorized for detection and/or diagnosis of SARS-CoV-2 by FDA under an Emergency Use Authorization (EUA). This EUA will remain in effect (meaning this test can be used) for the duration of the COVID-19 declaration under Section 564(b)(1) of the Act, 21 U.S.C. section 360bbb-3(b)(1), unless the authorization is terminated or revoked.     Resp Syncytial Virus by PCR NEGATIVE NEGATIVE Final    Comment: (NOTE) Fact Sheet for  Patients: bloggercourse.com  Fact Sheet for Healthcare Providers: seriousbroker.it  This test is not yet approved or cleared by the [place]  FDA and has been authorized for detection and/or diagnosis of SARS-CoV-2 by FDA under an Emergency Use Authorization (EUA). This EUA will remain  in effect (meaning this test can be used) for the duration of the COVID-19 declaration under Section 564(b)(1) of the Act, 21 U.S.C. section 360bbb-3(b)(1), unless the authorization is terminated or revoked.  Performed at [facility], [address] [place], [place] [address]      Labs: BNP (last 3 results) No results for input(s): BNP in the last 8760 hours. Basic Metabolic Panel: Recent Labs  Lab 11/04/24 1537 11/05/24 0429 11/06/24 0456 11/07/24 0414 11/09/24 0428  NA 127* 131* 130* 132* 129*  K 4.4 4.7 4.9 4.9 5.0  CL 89* 94* 93* 93* 87*  CO2 28 29 32 34* 35*  GLUCOSE 139* 131* 152* 142* 126*  BUN 13 12 13 15 13   CREATININE 0.66 0.59 0.63 0.59 0.61  CALCIUM  9.4 8.8* 8.9 8.9 8.9  MG  --   --  2.2 2.3 2.3   Liver Function Tests: Recent Labs  Lab 11/05/24 0429  AST 30  ALT 16  ALKPHOS 97  BILITOT 0.3  PROT 6.3*  ALBUMIN 3.5   No results for input(s): LIPASE, AMYLASE in the last 168 hours. No results for input(s): AMMONIA in the last 168 hours. CBC: Recent Labs  Lab 11/04/24 1537 11/05/24 0429 11/06/24 0456 11/07/24 0414 11/09/24 0428  WBC 13.8* 11.8* 13.7* 13.9* 10.8*  NEUTROABS 10.8*  --  12.0* 12.3* 9.4*  HGB 14.3 12.5 12.7 12.5 14.5  HCT 44.5 39.4 40.2 39.5 45.9  MCV 92.1 92.5 93.3 94.3 92.2  PLT 297 294 332 359 418*   Cardiac Enzymes: No results for input(s): CKTOTAL, CKMB, CKMBINDEX, TROPONINI in the last 168 hours. BNP: Invalid input(s): POCBNP CBG: No results for input(s): GLUCAP in the last 168 hours. D-Dimer No results for input(s): DDIMER in the last 72  hours. Hgb A1c No results for input(s): HGBA1C in the last 72 hours. Lipid Profile No results for input(s): CHOL, HDL, LDLCALC, TRIG, CHOLHDL, LDLDIRECT in the last 72 hours. Thyroid  function studies No results for input(s): TSH, T4TOTAL, T3FREE, THYROIDAB in the last 72 hours.  Invalid input(s): FREET3 Anemia work up No results for input(s): VITAMINB12, FOLATE, FERRITIN, TIBC, IRON, RETICCTPCT in the last 72 hours. Urinalysis No results found for: COLORURINE, APPEARANCEUR, LABSPEC, PHURINE, GLUCOSEU, HGBUR, BILIRUBINUR, KETONESUR, PROTEINUR, UROBILINOGEN, NITRITE, LEUKOCYTESUR Sepsis Labs Recent Labs  Lab 11/05/24 0429 11/06/24 0456 11/07/24 0414 11/09/24 0428  WBC 11.8* 13.7* 13.9* 10.8*   Microbiology Recent Results (from the past 240 hours)  Culture, blood (routine x 2)     Status: None   Collection Time: 11/04/24  3:25 PM   Specimen: BLOOD RIGHT FOREARM  Result Value Ref Range Status   Specimen Description   Final    BLOOD RIGHT FOREARM Performed at [facility], [address], [place], [place] [address]    Special Requests   Final    BOTTLES DRAWN AEROBIC AND ANAEROBIC Blood Culture results may not be optimal due to an inadequate volume of blood received in culture bottles Performed at [facility], [address] [place], [place] [address]    Culture   Final    NO GROWTH 5 DAYS Performed at [facility], [address] [place], [place] [address]    Report Status 11/09/2024 FINAL  Final  Culture, blood (routine x 2)     Status: None   Collection Time: 11/04/24  3:30 PM   Specimen: BLOOD  Result Value Ref Range Status   Specimen Description   Final  BLOOD RIGHT ANTECUBITAL Performed at [facility], [address], [place], [place] [address]    Special Requests   Final    BOTTLES DRAWN AEROBIC AND ANAEROBIC Blood Culture adequate volume Performed  at [facility], [address] [place], [place] [address]    Culture   Final    NO GROWTH 5 DAYS Performed at [facility], [address], [place], [place] [address]    Report Status 11/09/2024 FINAL  Final  Resp panel by RT-PCR (RSV, Flu A&B, Covid) Anterior Nasal Swab     Status: None   Collection Time: 11/04/24  3:33 PM   Specimen: Anterior Nasal Swab  Result Value Ref Range Status   SARS Coronavirus 2 by RT PCR NEGATIVE NEGATIVE Final    Comment: (NOTE) SARS-CoV-2 target nucleic acids are NOT DETECTED.  The SARS-CoV-2 RNA is generally detectable in upper respiratory specimens during the acute phase of infection. The lowest concentration of SARS-CoV-2 viral copies this assay can detect is 138 copies/mL. A negative result does not preclude SARS-Cov-2 infection and should not be used as the sole basis for treatment or other patient management decisions. A negative result may occur with  improper specimen collection/handling, submission of specimen other than nasopharyngeal swab, presence of viral mutation(s) within the areas targeted by this assay, and inadequate number of viral copies(<138 copies/mL). A negative result must be combined with clinical observations, patient history, and epidemiological information. The expected result is Negative.  Fact Sheet for Patients:  bloggercourse.com  Fact Sheet for Healthcare Providers:  seriousbroker.it  This test is no t yet approved or cleared by the [place]  FDA and  has been authorized for detection and/or diagnosis of SARS-CoV-2 by FDA under an Emergency Use Authorization (EUA). This EUA will remain  in effect (meaning this test can be used) for the duration of the COVID-19 declaration under Section 564(b)(1) of the Act, 21 U.S.C.section 360bbb-3(b)(1), unless the authorization is terminated  or revoked sooner.       Influenza A by PCR NEGATIVE  NEGATIVE Final   Influenza B by PCR NEGATIVE NEGATIVE Final    Comment: (NOTE) The Xpert Xpress SARS-CoV-2/FLU/RSV plus assay is intended as an aid in the diagnosis of influenza from Nasopharyngeal swab specimens and should not be used as a sole basis for treatment. Nasal washings and aspirates are unacceptable for Xpert Xpress SARS-CoV-2/FLU/RSV testing.  Fact Sheet for Patients: bloggercourse.com  Fact Sheet for Healthcare Providers: seriousbroker.it  This test is not yet approved or cleared by the [place]  FDA and has been authorized for detection and/or diagnosis of SARS-CoV-2 by FDA under an Emergency Use Authorization (EUA). This EUA will remain in effect (meaning this test can be used) for the duration of the COVID-19 declaration under Section 564(b)(1) of the Act, 21 U.S.C. section 360bbb-3(b)(1), unless the authorization is terminated or revoked.     Resp Syncytial Virus by PCR NEGATIVE NEGATIVE Final    Comment: (NOTE) Fact Sheet for Patients: bloggercourse.com  Fact Sheet for Healthcare Providers: seriousbroker.it  This test is not yet approved or cleared by the [place]  FDA and has been authorized for detection and/or diagnosis of SARS-CoV-2 by FDA under an Emergency Use Authorization (EUA). This EUA will remain in effect (meaning this test can be used) for the duration of the COVID-19 declaration under Section 564(b)(1) of the Act, 21 U.S.C. section 360bbb-3(b)(1), unless the authorization is terminated or revoked.  Performed at [facility], [address], [place], [place] [address]      Time coordinating discharge: Over 30 minutes  SIGNED:   Camellia PARAS Americo Vallery, DO  [facility] 11/09/2024, 9:46 AM

## 2024-11-09 NOTE — TOC CM/SW Note (Signed)
    Durable Medical Equipment  (From admission, onward)           Start     Ordered   11/09/24 0931  For home use only DME Nebulizer machine  Once       Question Answer Comment  Patient needs a nebulizer to treat with the following condition COPD (chronic obstructive pulmonary disease) (HCC)   Length of Need Lifetime   Additional equipment included Administration kit      11/09/24 0931   11/08/24 1337  For home use only DME oxygen  Once       Question Answer Comment  Length of Need 6 Months   Mode or (Route) Nasal cannula   Liters per Minute 2   Frequency Continuous (stationary and portable oxygen unit needed)   Oxygen conserving device Yes   Oxygen delivery system: Gas      11/08/24 1336

## 2024-11-14 ENCOUNTER — Non-Acute Institutional Stay (SKILLED_NURSING_FACILITY): Payer: Self-pay | Admitting: Nurse Practitioner

## 2024-11-14 ENCOUNTER — Encounter: Payer: Self-pay | Admitting: Nurse Practitioner

## 2024-11-14 DIAGNOSIS — E559 Vitamin D deficiency, unspecified: Secondary | ICD-10-CM

## 2024-11-14 DIAGNOSIS — E7800 Pure hypercholesterolemia, unspecified: Secondary | ICD-10-CM

## 2024-11-14 DIAGNOSIS — E039 Hypothyroidism, unspecified: Secondary | ICD-10-CM

## 2024-11-14 DIAGNOSIS — G4700 Insomnia, unspecified: Secondary | ICD-10-CM | POA: Diagnosis not present

## 2024-11-14 DIAGNOSIS — E871 Hypo-osmolality and hyponatremia: Secondary | ICD-10-CM

## 2024-11-14 DIAGNOSIS — I5030 Unspecified diastolic (congestive) heart failure: Secondary | ICD-10-CM

## 2024-11-14 DIAGNOSIS — I10 Essential (primary) hypertension: Secondary | ICD-10-CM | POA: Diagnosis not present

## 2024-11-14 DIAGNOSIS — K219 Gastro-esophageal reflux disease without esophagitis: Secondary | ICD-10-CM

## 2024-11-14 DIAGNOSIS — K5901 Slow transit constipation: Secondary | ICD-10-CM | POA: Diagnosis not present

## 2024-11-14 DIAGNOSIS — J449 Chronic obstructive pulmonary disease, unspecified: Secondary | ICD-10-CM

## 2024-11-14 NOTE — Assessment & Plan Note (Signed)
 Stable

## 2024-11-14 NOTE — Progress Notes (Unsigned)
 Location:   [facility] Room Number: 16 Place of Service:  SNF (31) Provider: Larwance Darlisha Kelm NP  Aisha Harvey, MD  Patient Care Team: Aisha Harvey, MD as PCP - General (Family Medicine) Odean Potts, MD as Consulting Physician (Hematology and Oncology) Gail Favorite, MD as Consulting Physician (General Surgery) Shannon Agent, MD as Consulting Physician (Radiation Oncology) Jeffrie Oneil BROCKS, MD as Consulting Physician (Cardiology) Kerrin Elspeth BROCKS, MD as Consulting Physician (Cardiothoracic Surgery) Gretta Leita SQUIBB, DO as Consulting Physician (Pulmonary Disease)  Extended Emergency Contact Information Primary Emergency Contact: Potter,Jane Address: [address]          [place], [place] [address] [place] Home Phone: [phone] Mobile Phone: [phone] Relation: Daughter Secondary Emergency Contact: Eanes,Casslyn Mobile Phone: [phone] Relation: Granddaughter Preferred language: English Interpreter needed? No  Code Status: DNR Goals of care: Advanced Directive information    11/04/2024    3:20 PM  Advanced Directives  Does Patient Have a Medical Advance Directive? No  Would patient like information on creating a medical advance directive? No - Patient declined     Chief Complaint  Patient presents with   Acute Visit    Medication review upon admission to SNF    HPI:  Pt is a 89 y.o. female seen today for an acute visit for medication review upon admission to SNF  Hospitalized 11/04/2024 to 11/09/2024 for progressive shortness of breath, chest x-ray showed no pulmonary edema or pneumonia , prednisone  taper dose upon discharge for COPD exacerbation.  Improved SOB, cough, generalized weakness, off O2  Sleep apnea, Bipap.   COPD, chronic Trelegy,  followed by pulmonologist Dr. Kassie  Chronic respiratory failure with hypoxia  Hyponatremia, Na 129 11/09/24  Diastolic heart failure, hospital proBNP 554, euvolemic  HTN, taking  enalapril , Bun/creat 13/0.61 11/09/24  Vit D deficiency, taking vitamin D   HLD, no recent lipid panel.   Hypothyroidism, taking levothyroxine , TSH 0.454 11/04/24  Constipation, taking Senokot S  GERD, stable, Hgb 14.5 11/09/24  History of breast cancer  History of neuroendocrine carcinoma of the lung  Anxiety, TSH 0.454 11/04/24  Past Medical History:  Diagnosis Date   Breast cancer (HCC)    lumpectomy, no chemo or XRT; 1 year of anastrazole   Cataract    COPD, severe (HCC) 07/31/2016   Family history of adverse reaction to anesthesia     my sisiter was unable to move for a while with the spinal anesthesia.   Hypothyroidism    nodule    Neuroendocrine carcinoma of lung (HCC) 10/26/2016   Nodule of left lung    lower lobe   Pneumonia    as an infant   Sebaceous cyst of labia 08/18/2018   Shortness of breath dyspnea    recently increased    Skin cancer    nose- tx. with [facility]    Thyroid  nodule    Vaginal delivery    x2 /w  one being breech   Past Surgical History:  Procedure Laterality Date   BREAST LUMPECTOMY Right 1970's   R breast   BREAST LUMPECTOMY Bilateral 07/23/2016   BREAST LUMPECTOMY WITH NEEDLE LOCALIZATION Left 07/23/2016   Procedure: LEFT BREAST LUMPECTOMY WITH DOUBLE NEEDLE LOCALIZATION;  Surgeon: Favorite Gail, MD;  Location: [facility] OR;  Service: General;  Laterality: Left;   BREAST LUMPECTOMY WITH RADIOACTIVE SEED LOCALIZATION Right 07/23/2016   Procedure: RIGHT BREAST LUMPECTOMY WITH RADIOACTIVE SEED LOCALIZATION;  Surgeon: Favorite Gail, MD;  Location: [facility] OR;  Service: General;  Laterality: Right;   CATARACT EXTRACTION W/ INTRAOCULAR LENS IMPLANT     right eye   EYE SURGERY Right    /w IOL- cataract removed    FUDUCIAL PLACEMENT N/A 08/25/2016   Procedure: PLACEMENT OF FUDUCIAL;  Surgeon: Elspeth JAYSON Millers, MD;  Location: [facility] OR;  Service: Thoracic;  Laterality: N/A;   VARICOSE VEIN SURGERY Right    w/o stitches on R leg   VIDEO BRONCHOSCOPY WITH  ENDOBRONCHIAL NAVIGATION N/A 08/25/2016   Procedure: VIDEO BRONCHOSCOPY WITH ENDOBRONCHIAL NAVIGATION;  Surgeon: Elspeth JAYSON Millers, MD;  Location: [facility] OR;  Service: Thoracic;  Laterality: N/A;    Allergies  Allergen Reactions   Actonel [Risedronate Sodium] Other (See Comments)    indigestion   Amlodipine  Swelling    Allergies as of 11/14/2024       Reactions   Actonel [risedronate Sodium] Other (See Comments)   indigestion   Amlodipine  Swelling        Medication List        Accurate as of November 14, 2024 11:59 PM. If you have any questions, ask your nurse or doctor.          cholecalciferol  1000 units tablet Commonly known as: VITAMIN D  Take 1,000 Units by mouth daily with lunch.   enalapril  2.5 MG tablet Commonly known as: VASOTEC  Take 2.5 mg by mouth daily.   ipratropium-albuterol  0.5-2.5 (3) MG/3ML Soln Commonly known as: DUONEB Inhale 3 mLs by nebulization every 4 (four) hours as needed (Wheezing/shortness of breath).   levothyroxine  50 MCG tablet Commonly known as: SYNTHROID  Take 50 mcg by mouth daily before breakfast.   naproxen sodium 220 MG tablet Commonly known as: ALEVE Take 220 mg by mouth 2 (two) times daily as needed (for pain.).   predniSONE  10 MG tablet Commonly known as: DELTASONE  Take 4 tablets (40 mg total) by mouth daily for 3 days, THEN 3 tablets (30 mg total) daily for 3 days, THEN 2 tablets (20 mg total) daily for 3 days, THEN 1 tablet (10 mg total) daily for 3 days. Start taking on: November 10, 2024   senna-docusate 8.6-50 MG tablet Commonly known as: Senokot-S Take 1 tablet by mouth 2 (two) times daily. What changed:  how much to take when to take this reasons to take this   Trelegy Ellipta  100-62.5-25 MCG/ACT Aepb Generic drug: Fluticasone -Umeclidin-Vilant Inhale 1 puff into the lungs daily.        Review of Systems  Constitutional:  Negative for appetite change and fatigue.  HENT:  Positive for hearing loss. Negative  for congestion and trouble swallowing.   Eyes:  Negative for visual disturbance.  Respiratory:  Negative for cough and shortness of breath.   Cardiovascular:  Negative for chest pain, palpitations and leg swelling.  Gastrointestinal:  Negative for abdominal pain, constipation, nausea and vomiting.  Genitourinary:  Negative for dysuria and urgency.  Musculoskeletal:  Positive for arthralgias and gait problem.  Skin:  Negative for color change.  Neurological:  Negative for speech difficulty, weakness and headaches.  Psychiatric/Behavioral:  Negative for behavioral problems and sleep disturbance. The patient is not nervous/anxious.     Immunization History  Administered Date(s) Administered   Fluad Quad(high Dose 65+) 09/06/2021   Fluzone Influenza virus vaccine,trivalent (IIV3), split virus 09/12/2020   INFLUENZA, HIGH DOSE SEASONAL PF 09/25/2017, 09/28/2018, 09/20/2019, 08/22/2021, 09/20/2024   Influenza,inj,Quad PF,6+ Mos 10/15/2016, 09/16/2019   Influenza-Unspecified 10/12/2023   PFIZER(Purple Top)SARS-COV-2 Vaccination 01/01/2020, 01/23/2020, 09/22/2020   Pneumococcal Conjugate-13 05/03/2015   Tdap 07/21/2010  Pertinent  Health Maintenance Due  Topic Date Due   Bone Density Scan  Never done   Mammogram  08/16/2022   Influenza Vaccine  Completed      12/28/2021    7:30 AM 12/28/2021    9:17 PM 12/29/2021   12:39 PM 12/29/2021   10:19 PM 12/30/2021   11:17 AM  Fall Risk  (RETIRED) Patient Fall Risk Level High fall risk  High fall risk  High fall risk  High fall risk  High fall risk      Data saved with a previous flowsheet row definition   Functional Status Survey:    Vitals:   11/14/24 1231  BP: 134/60  Pulse: 99  Resp: 20  Temp: (!) 97 F (36.1 C)  SpO2: 96%  Weight: 103 lb 12.8 oz (47.1 kg)   Body mass index is 20.27 kg/m. Physical Exam Vitals and nursing note reviewed.  Constitutional:      Appearance: Normal appearance.  HENT:     Head: Normocephalic and  atraumatic.     Nose: Nose normal.     Mouth/Throat:     Mouth: Mucous membranes are moist.  Eyes:     Extraocular Movements: Extraocular movements intact.     Conjunctiva/sclera: Conjunctivae normal.     Pupils: Pupils are equal, round, and reactive to light.  Cardiovascular:     Rate and Rhythm: Normal rate and regular rhythm.     Heart sounds: No murmur heard. Pulmonary:     Effort: Pulmonary effort is normal.     Breath sounds: Rales present. No wheezing.     Comments: Diffused inspiratory rales R mid to lower lungs, left base too Abdominal:     General: Bowel sounds are normal.     Palpations: Abdomen is soft.     Tenderness: There is no abdominal tenderness.  Musculoskeletal:        General: No tenderness. Normal range of motion.     Cervical back: Normal range of motion and neck supple.     Right lower leg: No edema.     Left lower leg: No edema.  Skin:    General: Skin is warm and dry.  Neurological:     General: No focal deficit present.     Mental Status: She is alert and oriented to person, place, and time. Mental status is at baseline.     Motor: No weakness.     Coordination: Coordination normal.     Gait: Gait abnormal.     Comments: Only uses walker if go further  Psychiatric:        Mood and Affect: Mood normal.        Behavior: Behavior normal.        Thought Content: Thought content normal.        Judgment: Judgment normal.     Labs reviewed: Recent Labs    11/06/24 0456 11/07/24 0414 11/09/24 0428  NA 130* 132* 129*  K 4.9 4.9 5.0  CL 93* 93* 87*  CO2 32 34* 35*  GLUCOSE 152* 142* 126*  BUN 13 15 13   CREATININE 0.63 0.59 0.61  CALCIUM  8.9 8.9 8.9  MG 2.2 2.3 2.3   Recent Labs    11/05/24 0429  AST 30  ALT 16  ALKPHOS 97  BILITOT 0.3  PROT 6.3*  ALBUMIN 3.5   Recent Labs    11/06/24 0456 11/07/24 0414 11/09/24 0428  WBC 13.7* 13.9* 10.8*  NEUTROABS 12.0* 12.3* 9.4*  HGB 12.7 12.5  14.5  HCT 40.2 39.5 45.9  MCV 93.3 94.3 92.2   PLT 332 359 418*   Lab Results  Component Value Date   TSH 0.454 11/04/2024   No results found for: HGBA1C No results found for: CHOL, HDL, LDLCALC, LDLDIRECT, TRIG, CHOLHDL  Significant Diagnostic Results in last 30 days:  DG CHEST PORT 1 VIEW Result Date: 11/07/2024 EXAM: 1 VIEW(S) XRAY OF THE CHEST 11/07/2024 05:27:00 PM COMPARISON: 11/04/2024 CLINICAL HISTORY: Dyspnea FINDINGS: LUNGS AND PLEURA: Chronic coarsened interstitial markings without pulmonary edema. Trace bilateral pleural effusions. No pneumothorax. HEART AND MEDIASTINUM: Aortic arch atherosclerosis. No acute abnormality of the cardiac and mediastinal silhouettes. BONES AND SOFT TISSUES: Bilateral surgical clips noted. No acute osseous abnormality. IMPRESSION: 1. Trace bilateral pleural effusions. 2. Chronic coarsened interstitial markings without pulmonary edema. Electronically signed by: Franky Stanford MD 11/07/2024 10:16 PM EST RP Workstation: [id number]   DG Chest Port 1 View Result Date: 11/04/2024 CLINICAL DATA:  Short of breath. EXAM: PORTABLE CHEST 1 VIEW COMPARISON:  01/15/2024. FINDINGS: Cardiac silhouette is normal size.  No mediastinal or hilar masses. Lungs are hyperexpanded with flattened hemidiaphragms. Mild interstitial prominence mostly at the lung bases. No evidence of pneumonia or pulmonary edema. No pleural effusion or pneumothorax. Surgical vascular clips overlying both breast consistent with IMPRESSION: No active disease. Electronically Signed   By: Alm Parkins M.D.   On: 11/04/2024 16:29    Assessment/Plan: Hyponatremia Na 129 11/09/24 Update CMP/eGFR  Diastolic heart failure (HCC)  hospital proBNP 554, euvolemic Update BNP  Essential hypertension Blood pressure is controlled taking enalapril , Bun/creat 13/0.61 11/09/24  Vitamin D  deficiency taking vitamin D  Updated vitamin D  level  Insomnia Stable  GERD (gastroesophageal reflux disease) stable, Hgb 14.5 11/09/24 Not  taking  Slow transit constipation Stable taking Senokot S  Hypothyroidism taking levothyroxine , TSH 0.454 11/04/24 Update HgbA1c, vitamin B12 level  Hypercholesterolemia no recent lipid panel.  Update date lipid panel  COPD, severe (HCC) COPD, chronic Trelegy,  followed by pulmonologist Dr. Kassie Improved SOB, cough, generalized weakness, off O2 Complete prednisone  taper dose    Family/ staff Communication: Plan of care reviewed with the patient and charge nurse  Labs/tests ordered: CMP/eGFR, lipid panel, HgbA1c, vitamin D , vitamin B12, BNP

## 2024-11-14 NOTE — Assessment & Plan Note (Signed)
 stable, Hgb 14.5 11/09/24 Not taking

## 2024-11-14 NOTE — Assessment & Plan Note (Signed)
 Stable taking Senokot S

## 2024-11-14 NOTE — Assessment & Plan Note (Signed)
 Na 129 11/09/24 Update CMP/eGFR

## 2024-11-14 NOTE — Assessment & Plan Note (Addendum)
 hospital proBNP 554, euvolemic Update BNP

## 2024-11-14 NOTE — Assessment & Plan Note (Signed)
 COPD, chronic Trelegy,  followed by pulmonologist Dr. Kassie Improved SOB, cough, generalized weakness, off O2 Complete prednisone  taper dose

## 2024-11-14 NOTE — Assessment & Plan Note (Signed)
 taking vitamin D  Updated vitamin D  level

## 2024-11-14 NOTE — Assessment & Plan Note (Addendum)
 taking levothyroxine , TSH 0.454 11/04/24 Update HgbA1c, vitamin B12 level

## 2024-11-14 NOTE — Assessment & Plan Note (Signed)
 no recent lipid panel.  Update date lipid panel

## 2024-11-14 NOTE — Assessment & Plan Note (Signed)
 Blood pressure is controlled taking enalapril , Bun/creat 13/0.61 11/09/24

## 2024-11-15 LAB — COMPREHENSIVE METABOLIC PANEL WITH GFR
Calcium: 8.8 (ref 8.7–10.7)
eGFR: 79

## 2024-11-15 LAB — VITAMIN B12: Vitamin B-12: 391

## 2024-11-15 LAB — BASIC METABOLIC PANEL WITH GFR
BUN: 15 (ref 4–21)
Chloride: 92 — AB (ref 99–108)
Creatinine: 0.7 (ref 0.5–1.1)
Glucose: 75
Potassium: 4.7 meq/L (ref 3.5–5.1)
Sodium: 135 — AB (ref 137–147)

## 2024-11-15 LAB — HEPATIC FUNCTION PANEL
ALT: 18 U/L (ref 7–35)
AST: 14 (ref 13–35)
Alkaline Phosphatase: 51 (ref 25–125)

## 2024-11-15 LAB — LIPID PANEL
Cholesterol: 185 (ref 0–200)
HDL: 71 — AB (ref 35–70)
LDL Cholesterol: 100
Triglycerides: 55 (ref 40–160)

## 2024-11-15 LAB — VITAMIN D 25 HYDROXY (VIT D DEFICIENCY, FRACTURES): Vit D, 25-Hydroxy: 53

## 2024-11-15 LAB — PROTEIN / CREATININE RATIO, URINE: Albumin, U: 3.6

## 2024-11-15 LAB — HEMOGLOBIN A1C: Hemoglobin A1C: 6.2

## 2024-11-16 ENCOUNTER — Encounter: Payer: Self-pay | Admitting: Adult Health

## 2024-11-16 ENCOUNTER — Non-Acute Institutional Stay (SKILLED_NURSING_FACILITY): Admitting: Family Medicine

## 2024-11-16 ENCOUNTER — Non-Acute Institutional Stay (SKILLED_NURSING_FACILITY): Admitting: Adult Health

## 2024-11-16 DIAGNOSIS — I10 Essential (primary) hypertension: Secondary | ICD-10-CM | POA: Diagnosis not present

## 2024-11-16 DIAGNOSIS — E039 Hypothyroidism, unspecified: Secondary | ICD-10-CM

## 2024-11-16 DIAGNOSIS — J449 Chronic obstructive pulmonary disease, unspecified: Secondary | ICD-10-CM

## 2024-11-16 NOTE — Assessment & Plan Note (Signed)
 Patient currently treated with enalapril  low-dose, 2.5 mg.  No changes recommended

## 2024-11-16 NOTE — Assessment & Plan Note (Signed)
 Currently on thyroid  replacement 50 mcg daily with TSH in therapeutic range

## 2024-11-16 NOTE — Progress Notes (Signed)
 Provider:  Garnette Pinal, MD Location:      Place of Service:     PCP: Aisha Harvey, MD Patient Care Team: Aisha Harvey, MD as PCP - General (Family Medicine) Odean Potts, MD as Consulting Physician (Hematology and Oncology) Gail Favorite, MD as Consulting Physician (General Surgery) Shannon Agent, MD as Consulting Physician (Radiation Oncology) Jeffrie Oneil BROCKS, MD as Consulting Physician (Cardiology) Kerrin Elspeth BROCKS, MD as Consulting Physician (Cardiothoracic Surgery) Gretta Leita SQUIBB, DO as Consulting Physician (Pulmonary Disease)  Extended Emergency Contact Information Primary Emergency Contact: Potter,Jane Address: [address]          [place], [place] [address] [place] Home Phone: [phone] Mobile Phone: [phone] Relation: Daughter Secondary Emergency Contact: Eanes,Casslyn Mobile Phone: [phone] Relation: Granddaughter Preferred language: English Interpreter needed? No  Code Status:  Goals of Care: Advanced Directive information    11/16/2024   11:47 AM  Advanced Directives  Does Patient Have a Medical Advance Directive? Yes  Type of Advance Directive Out of facility DNR (pink MOST or yellow form)  Does patient want to make changes to medical advance directive? No - Patient declined       HPI: Patient is a 89 y.o. female seen today for admission to [facility] skilled nursing facility.  Patient was hospitalized 11/04/2024 to 11/09/2024 prior to being admitted here. Several days before admission she noted the onset of sore throat and some congestion.  She was evaluated at urgent care for strep throat and RSV which was negative over the following day she continued to worsen.  She returned to urgent care who recommended that she go to the emergency room whereupon she was admitted with oxygen saturation in the 60s and placed on BiPAP. She was also started on IV steroids and scheduled neb treatments.  On the day of  discharge she was noted to desaturate to 85% with mobility but was eventually sent home on prednisone  taper.  Upon returning to her apartment and she continued to have problems and the nurse suggested transfer to skilled nursing where she has been over the last week.  Currently she is doing very well off her oxygen.  She monitors her own oxygen saturations.  She continues on Trelegy as an inhaler. Past medical history is significant for diastolic heart failure, essential hypertension, hypercholesterolemia, hypothyroidism, and breast cancer.  Past Medical History:  Diagnosis Date   Breast cancer (HCC)    lumpectomy, no chemo or XRT; 1 year of anastrazole   Cataract    COPD, severe (HCC) 07/31/2016   Family history of adverse reaction to anesthesia     my sisiter was unable to move for a while with the spinal anesthesia.   Hypothyroidism    nodule    Neuroendocrine carcinoma of lung (HCC) 10/26/2016   Nodule of left lung    lower lobe   Pneumonia    as an infant   Sebaceous cyst of labia 08/18/2018   Shortness of breath dyspnea    recently increased    Skin cancer    nose- tx. with [facility]    Thyroid  nodule    Vaginal delivery    x2 /w  one being breech   Past Surgical History:  Procedure Laterality Date   BREAST LUMPECTOMY Right 1970's   R breast   BREAST LUMPECTOMY Bilateral 07/23/2016   BREAST LUMPECTOMY WITH NEEDLE LOCALIZATION Left 07/23/2016   Procedure: LEFT BREAST LUMPECTOMY WITH DOUBLE NEEDLE LOCALIZATION;  Surgeon: Favorite Gail, MD;  Location: [facility] OR;  Service: General;  Laterality: Left;   BREAST LUMPECTOMY WITH RADIOACTIVE SEED LOCALIZATION Right 07/23/2016   Procedure: RIGHT BREAST LUMPECTOMY WITH RADIOACTIVE SEED LOCALIZATION;  Surgeon: Elon Pacini, MD;  Location: [facility] OR;  Service: General;  Laterality: Right;   CATARACT EXTRACTION W/ INTRAOCULAR LENS IMPLANT     right eye   EYE SURGERY Right    /w IOL- cataract removed    FUDUCIAL PLACEMENT N/A 08/25/2016    Procedure: PLACEMENT OF FUDUCIAL;  Surgeon: Elspeth JAYSON Millers, MD;  Location: [facility] OR;  Service: Thoracic;  Laterality: N/A;   VARICOSE VEIN SURGERY Right    w/o stitches on R leg   VIDEO BRONCHOSCOPY WITH ENDOBRONCHIAL NAVIGATION N/A 08/25/2016   Procedure: VIDEO BRONCHOSCOPY WITH ENDOBRONCHIAL NAVIGATION;  Surgeon: Elspeth JAYSON Millers, MD;  Location: [facility] OR;  Service: Thoracic;  Laterality: N/A;    reports that she quit smoking about 50 years ago. Her smoking use included cigarettes. She started smoking about 70 years ago. She has a 15 pack-year smoking history. She has never used smokeless tobacco. She reports current alcohol  use of about 6.0 standard drinks of alcohol  per week. She reports that she does not use drugs. Social History   Socioeconomic History   Marital status: Widowed    Spouse name: Not on file   Number of children: 2   Years of education: Not on file   Highest education level: Not on file  Occupational History   Not on file  Tobacco Use   Smoking status: Former    Current packs/day: 0.00    Average packs/day: 0.8 packs/day for 20.0 years (15.0 ttl pk-yrs)    Types: Cigarettes    Start date: 05/08/1954    Quit date: 05/08/1974    Years since quitting: 50.5   Smokeless tobacco: Never  Vaping Use   Vaping status: Never Used  Substance and Sexual Activity   Alcohol  use: Yes    Alcohol /week: 6.0 standard drinks of alcohol     Types: 6 Glasses of wine per week    Comment: social   Drug use: No   Sexual activity: Never  Other Topics Concern   Not on file  Social History Narrative   Not on file   Social Drivers of Health   Financial Resource Strain: Not on file  Food Insecurity: No Food Insecurity (11/04/2024)   Hunger Vital Sign    Worried About Running Out of Food in the Last Year: Never true    Ran Out of Food in the Last Year: Never true  Transportation Needs: No Transportation Needs (11/04/2024)   PRAPARE - Administrator, Civil Service  (Medical): No    Lack of Transportation (Non-Medical): No  Physical Activity: Not on file  Stress: Not on file  Social Connections: Moderately Isolated (11/04/2024)   Social Connection and Isolation Panel    Frequency of Communication with Friends and Family: More than three times a week    Frequency of Social Gatherings with Friends and Family: More than three times a week    Attends Religious Services: More than 4 times per year    Active Member of [employer] or Organizations: No    Attends Banker Meetings: Never    Marital Status: Widowed  Intimate Partner Violence: Not At Risk (11/04/2024)   Humiliation, Afraid, Rape, and Kick questionnaire    Fear of Current or Ex-Partner: No    Emotionally Abused: No    Physically Abused: No    Sexually  Abused: No    Functional Status Survey:    Family History  Problem Relation Age of Onset   Prostate cancer Brother    Lung cancer Brother    Cystic fibrosis Cousin    Cancer Son     Health Maintenance  Topic Date Due   Zoster Vaccines- Shingrix (1 of 2) Never done   Bone Density Scan  Never done   Mammogram  08/16/2022   COVID-19 Vaccine (4 - 2025-26 season) 12/02/2024 (Originally 08/08/2024)   Medicare Annual Wellness (AWV)  07/18/2025   DTaP/Tdap/Td (3 - Td or Tdap) 02/24/2034   Pneumococcal Vaccine: 50+ Years  Completed   Influenza Vaccine  Completed   Meningococcal B Vaccine  Aged Out    Allergies  Allergen Reactions   Actonel [Risedronate Sodium] Other (See Comments)    indigestion   Amlodipine  Swelling    Outpatient Encounter Medications as of 11/16/2024  Medication Sig   cholecalciferol  (VITAMIN D ) 1000 units tablet Take 1,000 Units by mouth daily with lunch.   enalapril  (VASOTEC ) 2.5 MG tablet Take 2.5 mg by mouth daily.   Fluticasone -Umeclidin-Vilant (TRELEGY ELLIPTA ) 100-62.5-25 MCG/ACT AEPB Inhale 1 puff into the lungs daily.   ipratropium-albuterol  (DUONEB) 0.5-2.5 (3) MG/3ML SOLN Inhale 3 mLs by  nebulization every 4 (four) hours as needed (Wheezing/shortness of breath).   levothyroxine  (SYNTHROID , LEVOTHROID) 50 MCG tablet Take 50 mcg by mouth daily before breakfast.   naproxen sodium (ALEVE) 220 MG tablet Take 220 mg by mouth every 12 (twelve) hours.   naproxen sodium (ANAPROX) 220 MG tablet Take 220 mg by mouth 2 (two) times daily as needed (for pain.).   predniSONE  (DELTASONE ) 10 MG tablet Take 4 tablets (40 mg total) by mouth daily for 3 days, THEN 3 tablets (30 mg total) daily for 3 days, THEN 2 tablets (20 mg total) daily for 3 days, THEN 1 tablet (10 mg total) daily for 3 days.   senna-docusate (SENOKOT-S) 8.6-50 MG tablet Take 1 tablet by mouth 2 (two) times daily.   No facility-administered encounter medications on file as of 11/16/2024.    Review of Systems  Constitutional:  Positive for appetite change.  HENT: Negative.    Eyes: Negative.   Respiratory:  Positive for shortness of breath.   Cardiovascular: Negative.   Genitourinary: Negative.   Neurological: Negative.   Psychiatric/Behavioral: Negative.    All other systems reviewed and are negative.   There were no vitals filed for this visit. There is no height or weight on file to calculate BMI. Physical Exam Vitals and nursing note reviewed.  Constitutional:      Appearance: Normal appearance.  HENT:     Head: Normocephalic.     Nose: Nose normal.     Mouth/Throat:     Mouth: Mucous membranes are moist.     Pharynx: Oropharynx is clear.  Cardiovascular:     Rate and Rhythm: Regular rhythm. Tachycardia present.  Pulmonary:     Effort: Pulmonary effort is normal.     Breath sounds: Normal breath sounds.  Abdominal:     General: Bowel sounds are normal.     Palpations: Abdomen is soft.  Musculoskeletal:        General: Normal range of motion.     Cervical back: Normal range of motion.  Skin:    General: Skin is warm.  Neurological:     General: No focal deficit present.     Mental Status: She is  alert and oriented to person, place, and time.  Psychiatric:        Mood and Affect: Mood normal.        Behavior: Behavior normal.     Labs reviewed: Basic Metabolic Panel: Recent Labs    11/06/24 0456 11/07/24 0414 11/09/24 0428 11/15/24 0000  NA 130* 132* 129* 135*  K 4.9 4.9 5.0 4.7  CL 93* 93* 87* 92*  CO2 32 34* 35*  --   GLUCOSE 152* 142* 126*  --   BUN 13 15 13 15   CREATININE 0.63 0.59 0.61 0.7  CALCIUM  8.9 8.9 8.9 8.8  MG 2.2 2.3 2.3  --    Liver Function Tests: Recent Labs    11/05/24 0429 11/15/24 0000  AST 30 14  ALT 16 18  ALKPHOS 97 51  BILITOT 0.3  --   PROT 6.3*  --   ALBUMIN 3.5  --    No results for input(s): LIPASE, AMYLASE in the last 8760 hours. No results for input(s): AMMONIA in the last 8760 hours. CBC: Recent Labs    11/06/24 0456 11/07/24 0414 11/09/24 0428  WBC 13.7* 13.9* 10.8*  NEUTROABS 12.0* 12.3* 9.4*  HGB 12.7 12.5 14.5  HCT 40.2 39.5 45.9  MCV 93.3 94.3 92.2  PLT 332 359 418*   Cardiac Enzymes: No results for input(s): CKTOTAL, CKMB, CKMBINDEX, TROPONINI in the last 8760 hours. BNP: Invalid input(s): POCBNP Lab Results  Component Value Date   HGBA1C 6.2 11/15/2024   Lab Results  Component Value Date   TSH 0.454 11/04/2024   Lab Results  Component Value Date   VITAMINB12 391 11/15/2024   No results found for: FOLATE No results found for: IRON, TIBC, FERRITIN  Imaging and Procedures obtained prior to SNF admission: DG Chest Port 1 View Result Date: 11/04/2024 CLINICAL DATA:  Short of breath. EXAM: PORTABLE CHEST 1 VIEW COMPARISON:  01/15/2024. FINDINGS: Cardiac silhouette is normal size.  No mediastinal or hilar masses. Lungs are hyperexpanded with flattened hemidiaphragms. Mild interstitial prominence mostly at the lung bases. No evidence of pneumonia or pulmonary edema. No pleural effusion or pneumothorax. Surgical vascular clips overlying both breast consistent with IMPRESSION: No  active disease. Electronically Signed   By: Alm Parkins M.D.   On: 11/04/2024 16:29    Assessment/Plan Essential hypertension Patient currently treated with enalapril  low-dose, 2.5 mg.  No changes recommended  COPD, severe (HCC) Patient with a recent exacerbation of COPD.  There did not seem to be a significant infection causing the exacerbation.  She responded steroids without antibiotics  Hypothyroidism Currently on thyroid  replacement 50 mcg daily with TSH in therapeutic range    Family/ staff Communication:   Labs/tests ordered:none  Garnette HERO. Cleotilde, MD [facility] [address] [place], [place] [address] Office [phone]

## 2024-11-16 NOTE — Assessment & Plan Note (Signed)
 Patient with a recent exacerbation of COPD.  There did not seem to be a significant infection causing the exacerbation.  She responded steroids without antibiotics

## 2024-11-16 NOTE — Progress Notes (Signed)
 Location:  [facility] Room Number: VOLNEY CERT [id number] Place of Service:  SNF (31) Provider:  Medina-Vargas, Sufian Ravi, DNP, FNP-BC  Patient Care Team: Aisha Harvey, MD as PCP - General (Family Medicine) Odean Potts, MD as Consulting Physician (Hematology and Oncology) Gail Favorite, MD as Consulting Physician (General Surgery) Shannon Agent, MD as Consulting Physician (Radiation Oncology) Jeffrie Oneil BROCKS, MD as Consulting Physician (Cardiology) Kerrin Elspeth BROCKS, MD as Consulting Physician (Cardiothoracic Surgery) Gretta Leita SQUIBB, DO as Consulting Physician (Pulmonary Disease)  Extended Emergency Contact Information Primary Emergency Contact: Potter,Jane Address: [address]          [place], [place] [address] [place] Home Phone: [phone] Mobile Phone: [phone] Relation: Daughter Secondary Emergency Contact: Eanes,Casslyn Mobile Phone: [phone] Relation: Granddaughter Preferred language: English Interpreter needed? No  Code Status:  DNR  Goals of care: Advanced Directive information    11/16/2024   11:47 AM  Advanced Directives  Does Patient Have a Medical Advance Directive? Yes  Type of Advance Directive Out of facility DNR (pink MOST or yellow form)  Does patient want to make changes to medical advance directive? No - Patient declined     Chief Complaint  Patient presents with   DISCHARGE    Independent living with PT, OT and ST evaluate and treatment    HPI:  Pt is a 89 y.o. female seen today for a discharge visit. She will discharge to [facility] living on 11/28/24.  She was recently hospitalized 11/04/24 to 11/09/24 for hypoxic respiratory failure and COPD exacerbation. She was initiated on BiPAP due to O2 sat in the 60s. Chest x-ray was negative for pneumonia or pulmonary edema. She started on IV steroids with scheduled nebs with improvement of symptoms. On day of  discharge, she had an episode of desaturating to 85% with mobility and maintained O2 sat of 88% in 2L nasal cannula.  She was seen in the room today and aware of the orders. She is currently on room air with oxygen saturation of 96%. She stated that she has enough of her medication supplies and does not need additional prescription.   Past Medical History:  Diagnosis Date   Breast cancer (HCC)    lumpectomy, no chemo or XRT; 1 year of anastrazole   Cataract    COPD, severe (HCC) 07/31/2016   Family history of adverse reaction to anesthesia     my sisiter was unable to move for a while with the spinal anesthesia.   Hypothyroidism    nodule    Neuroendocrine carcinoma of lung (HCC) 10/26/2016   Nodule of left lung    lower lobe   Pneumonia    as an infant   Sebaceous cyst of labia 08/18/2018   Shortness of breath dyspnea    recently increased    Skin cancer    nose- tx. with [facility]    Thyroid  nodule    Vaginal delivery    x2 /w  one being breech   Past Surgical History:  Procedure Laterality Date   BREAST LUMPECTOMY Right 1970's   R breast   BREAST LUMPECTOMY Bilateral 07/23/2016   BREAST LUMPECTOMY WITH NEEDLE LOCALIZATION Left 07/23/2016   Procedure: LEFT BREAST LUMPECTOMY WITH DOUBLE NEEDLE LOCALIZATION;  Surgeon: Favorite Gail, MD;  Location: [facility] OR;  Service: General;  Laterality: Left;   BREAST LUMPECTOMY WITH RADIOACTIVE SEED LOCALIZATION Right 07/23/2016   Procedure: RIGHT BREAST LUMPECTOMY WITH RADIOACTIVE SEED LOCALIZATION;  Surgeon:  Elon Pacini, MD;  Location: [facility] OR;  Service: General;  Laterality: Right;   CATARACT EXTRACTION W/ INTRAOCULAR LENS IMPLANT     right eye   EYE SURGERY Right    /w IOL- cataract removed    FUDUCIAL PLACEMENT N/A 08/25/2016   Procedure: PLACEMENT OF FUDUCIAL;  Surgeon: Elspeth JAYSON Millers, MD;  Location: [facility] OR;  Service: Thoracic;  Laterality: N/A;   VARICOSE VEIN SURGERY Right    w/o stitches on R leg   VIDEO BRONCHOSCOPY WITH  ENDOBRONCHIAL NAVIGATION N/A 08/25/2016   Procedure: VIDEO BRONCHOSCOPY WITH ENDOBRONCHIAL NAVIGATION;  Surgeon: Elspeth JAYSON Millers, MD;  Location: [facility] OR;  Service: Thoracic;  Laterality: N/A;    Allergies  Allergen Reactions   Actonel [Risedronate Sodium] Other (See Comments)    indigestion   Amlodipine  Swelling    Outpatient Encounter Medications as of 11/16/2024  Medication Sig   cholecalciferol  (VITAMIN D ) 1000 units tablet Take 1,000 Units by mouth daily with lunch.   enalapril  (VASOTEC ) 2.5 MG tablet Take 2.5 mg by mouth daily.   Fluticasone -Umeclidin-Vilant (TRELEGY ELLIPTA ) 100-62.5-25 MCG/ACT AEPB Inhale 1 puff into the lungs daily.   ipratropium-albuterol  (DUONEB) 0.5-2.5 (3) MG/3ML SOLN Inhale 3 mLs by nebulization every 4 (four) hours as needed (Wheezing/shortness of breath).   levothyroxine  (SYNTHROID , LEVOTHROID) 50 MCG tablet Take 50 mcg by mouth daily before breakfast.   naproxen sodium (ALEVE) 220 MG tablet Take 220 mg by mouth every 12 (twelve) hours.   naproxen sodium (ANAPROX) 220 MG tablet Take 220 mg by mouth 2 (two) times daily as needed (for pain.).   predniSONE  (DELTASONE ) 10 MG tablet Take 4 tablets (40 mg total) by mouth daily for 3 days, THEN 3 tablets (30 mg total) daily for 3 days, THEN 2 tablets (20 mg total) daily for 3 days, THEN 1 tablet (10 mg total) daily for 3 days.   senna-docusate (SENOKOT-S) 8.6-50 MG tablet Take 1 tablet by mouth 2 (two) times daily.   No facility-administered encounter medications on file as of 11/16/2024.    Review of Systems  Constitutional:  Negative for appetite change, chills, fatigue and fever.  HENT:  Negative for congestion, hearing loss, rhinorrhea and sore throat.   Eyes: Negative.   Respiratory:  Negative for cough, shortness of breath and wheezing.   Cardiovascular:  Negative for chest pain, palpitations and leg swelling.  Gastrointestinal:  Negative for abdominal pain, constipation, diarrhea, nausea and vomiting.   Genitourinary:  Negative for dysuria.  Musculoskeletal:  Negative for arthralgias, back pain and myalgias.  Skin:  Negative for color change, rash and wound.  Neurological:  Negative for dizziness, weakness and headaches.  Psychiatric/Behavioral:  Negative for behavioral problems. The patient is not nervous/anxious.      Immunization History  Administered Date(s) Administered   Fluad Quad(high Dose 65+) 09/06/2021   Fluzone Influenza virus vaccine,trivalent (IIV3), split virus 09/12/2020   INFLUENZA, HIGH DOSE SEASONAL PF 09/25/2017, 09/28/2018, 09/20/2019, 08/22/2021, 08/22/2021, 09/09/2021, 09/18/2022, 10/26/2023, 09/20/2024   Influenza,inj,Quad PF,6+ Mos 10/15/2016, 09/16/2019   Influenza-Unspecified 10/12/2023   PFIZER(Purple Top)SARS-COV-2 Vaccination 01/01/2020, 01/23/2020, 09/22/2020   PNEUMOCOCCAL CONJUGATE-20 06/17/2022   Pneumococcal Conjugate-13 05/03/2015   Pneumococcal Polysaccharide-23 05/08/2018   Respiratory Syncytial Virus Vaccine,Recomb Aduvanted(Arexvy) 07/18/2024   Tdap 07/21/2010, 02/25/2024   Pertinent  Health Maintenance Due  Topic Date Due   Bone Density Scan  Never done   Mammogram  08/16/2022   Influenza Vaccine  Completed      12/28/2021    7:30 AM 12/28/2021  9:17 PM 12/29/2021   12:39 PM 12/29/2021   10:19 PM 12/30/2021   11:17 AM  Fall Risk  (RETIRED) Patient Fall Risk Level High fall risk  High fall risk  High fall risk  High fall risk  High fall risk      Data saved with a previous flowsheet row definition     Vitals:   11/16/24 1143  BP: (!) 118/53  Pulse: 88  Resp: 19  Temp: (!) 96.5 F (35.8 C)  SpO2: 96%  Weight: 103 lb 12.8 oz (47.1 kg)  Height: 5' (1.524 m)   Body mass index is 20.27 kg/m.  Physical Exam Constitutional:      Appearance: Normal appearance.  HENT:     Head: Normocephalic and atraumatic.     Nose: Nose normal.     Mouth/Throat:     Mouth: Mucous membranes are moist.  Eyes:     Conjunctiva/sclera:  Conjunctivae normal.  Cardiovascular:     Rate and Rhythm: Normal rate and regular rhythm.  Pulmonary:     Effort: Pulmonary effort is normal.     Breath sounds: Normal breath sounds.  Abdominal:     General: Bowel sounds are normal.     Palpations: Abdomen is soft.  Musculoskeletal:        General: Normal range of motion.     Cervical back: Normal range of motion.  Skin:    General: Skin is warm and dry.  Neurological:     General: No focal deficit present.     Mental Status: She is alert and oriented to person, place, and time.  Psychiatric:        Mood and Affect: Mood normal.        Behavior: Behavior normal.        Thought Content: Thought content normal.        Judgment: Judgment normal.        Labs reviewed: Recent Labs    11/06/24 0456 11/07/24 0414 11/09/24 0428 11/15/24 0000  NA 130* 132* 129* 135*  K 4.9 4.9 5.0 4.7  CL 93* 93* 87* 92*  CO2 32 34* 35*  --   GLUCOSE 152* 142* 126*  --   BUN 13 15 13 15   CREATININE 0.63 0.59 0.61 0.7  CALCIUM  8.9 8.9 8.9 8.8  MG 2.2 2.3 2.3  --    Recent Labs    11/05/24 0429 11/15/24 0000  AST 30 14  ALT 16 18  ALKPHOS 97 51  BILITOT 0.3  --   PROT 6.3*  --   ALBUMIN 3.5  --    Recent Labs    11/06/24 0456 11/07/24 0414 11/09/24 0428  WBC 13.7* 13.9* 10.8*  NEUTROABS 12.0* 12.3* 9.4*  HGB 12.7 12.5 14.5  HCT 40.2 39.5 45.9  MCV 93.3 94.3 92.2  PLT 332 359 418*   Lab Results  Component Value Date   TSH 0.454 11/04/2024   Lab Results  Component Value Date   HGBA1C 6.2 11/15/2024   Lab Results  Component Value Date   CHOL 185 11/15/2024   HDL 71 (A) 11/15/2024   LDLCALC 100 11/15/2024   TRIG 55 11/15/2024    Significant Diagnostic Results in last 30 days:  DG CHEST PORT 1 VIEW Result Date: 11/07/2024 EXAM: 1 VIEW(S) XRAY OF THE CHEST 11/07/2024 05:27:00 PM COMPARISON: 11/04/2024 CLINICAL HISTORY: Dyspnea FINDINGS: LUNGS AND PLEURA: Chronic coarsened interstitial markings without pulmonary  edema. Trace bilateral pleural effusions. No pneumothorax. HEART AND MEDIASTINUM: Aortic arch  atherosclerosis. No acute abnormality of the cardiac and mediastinal silhouettes. BONES AND SOFT TISSUES: Bilateral surgical clips noted. No acute osseous abnormality. IMPRESSION: 1. Trace bilateral pleural effusions. 2. Chronic coarsened interstitial markings without pulmonary edema. Electronically signed by: Franky Stanford MD 11/07/2024 10:16 PM EST RP Workstation: [id number]   DG Chest Port 1 View Result Date: 11/04/2024 CLINICAL DATA:  Short of breath. EXAM: PORTABLE CHEST 1 VIEW COMPARISON:  01/15/2024. FINDINGS: Cardiac silhouette is normal size.  No mediastinal or hilar masses. Lungs are hyperexpanded with flattened hemidiaphragms. Mild interstitial prominence mostly at the lung bases. No evidence of pneumonia or pulmonary edema. No pleural effusion or pneumothorax. Surgical vascular clips overlying both breast consistent with IMPRESSION: No active disease. Electronically Signed   By: Alm Parkins M.D.   On: 11/04/2024 16:29    Assessment/Plan  1. COPD, severe (HCC) (Primary) -  no wheezing -  continue Prednisone  taper -  continue Trelegy Ellipta  inhale 1 puff orally daily -  continue Ipratropium-Albuterol  inhale 1 vial every 4 hours PRN  2. Hypothyroidism, unspecified type Lab Results  Component Value Date   TSH 0.454 11/04/2024    -  continue Levothyroxine  50 mcg daily  3. Essential hypertension -  continue Enalapril  2.5 mg daily     I have filled out patient's discharge paperwork. Patient will have in-house PT, OT and ST evaluation and treatment.  DME provided:  None  Total discharge time: Greater than 30 minutes  Discharge time involved coordination of the discharge process with social worker, nursing staff and therapy department.     Isabella Roemmich Medina-Vargas, DNP, MSN, FNP-BC [facility] [phone] (Monday-Friday 8:00 a.m. - 5:00  p.m.) [phone] (after hours)

## 2024-11-28 ENCOUNTER — Other Ambulatory Visit (HOSPITAL_COMMUNITY): Payer: Self-pay

## 2025-01-12 ENCOUNTER — Telehealth (HOSPITAL_BASED_OUTPATIENT_CLINIC_OR_DEPARTMENT_OTHER): Payer: Self-pay | Admitting: Pulmonary Disease

## 2025-01-12 ENCOUNTER — Other Ambulatory Visit (HOSPITAL_BASED_OUTPATIENT_CLINIC_OR_DEPARTMENT_OTHER): Payer: Self-pay

## 2025-01-12 MED ORDER — TRELEGY ELLIPTA 100-62.5-25 MCG/ACT IN AEPB: 1.0000 | INHALATION_SPRAY | Freq: Every day | RESPIRATORY_TRACT | 3 refills | Status: AC

## 2025-01-12 NOTE — Telephone Encounter (Signed)
Corrected RX sent to pharmacy.

## 2025-03-08 ENCOUNTER — Ambulatory Visit (HOSPITAL_BASED_OUTPATIENT_CLINIC_OR_DEPARTMENT_OTHER): Admitting: Pulmonary Disease
